# Patient Record
Sex: Female | Born: 1960 | Race: Black or African American | Hispanic: No | Marital: Married | Smoking: Never smoker
Health system: Southern US, Community
[De-identification: ages and names within clinical notes are randomized; demographics above are authoritative.]

## PROBLEM LIST (undated history)

## (undated) DIAGNOSIS — E785 Hyperlipidemia, unspecified: Secondary | ICD-10-CM

## (undated) DIAGNOSIS — I10 Essential (primary) hypertension: Secondary | ICD-10-CM

## (undated) DIAGNOSIS — M25512 Pain in left shoulder: Secondary | ICD-10-CM

## (undated) DIAGNOSIS — M549 Dorsalgia, unspecified: Secondary | ICD-10-CM

## (undated) DIAGNOSIS — T7840XA Allergy, unspecified, initial encounter: Secondary | ICD-10-CM

## (undated) DIAGNOSIS — E669 Obesity, unspecified: Secondary | ICD-10-CM

## (undated) DIAGNOSIS — E119 Type 2 diabetes mellitus without complications: Secondary | ICD-10-CM

## (undated) DIAGNOSIS — F418 Other specified anxiety disorders: Principal | ICD-10-CM

## (undated) DIAGNOSIS — M25511 Pain in right shoulder: Secondary | ICD-10-CM

## (undated) DIAGNOSIS — M25551 Pain in right hip: Secondary | ICD-10-CM

## (undated) HISTORY — DX: Pain in right hip: M25.551

## (undated) HISTORY — DX: Obesity, unspecified: E66.9

## (undated) HISTORY — DX: Hyperlipidemia, unspecified: E78.5

## (undated) HISTORY — DX: Type 2 diabetes mellitus without complications: E11.9

## (undated) HISTORY — DX: Other specified anxiety disorders: F41.8

## (undated) HISTORY — DX: Pain in left shoulder: M25.512

## (undated) HISTORY — DX: Dorsalgia, unspecified: M54.9

## (undated) HISTORY — DX: Allergy, unspecified, initial encounter: T78.40XA

## (undated) HISTORY — DX: Pain in right shoulder: M25.511

## (undated) HISTORY — DX: Essential (primary) hypertension: I10

---

## 2001-07-15 ENCOUNTER — Ambulatory Visit (HOSPITAL_COMMUNITY): Admission: RE | Admit: 2001-07-15 | Discharge: 2001-07-15 | Payer: Self-pay | Admitting: Family Medicine

## 2001-07-15 ENCOUNTER — Encounter: Payer: Self-pay | Admitting: Family Medicine

## 2001-09-02 ENCOUNTER — Encounter: Payer: Self-pay | Admitting: Family Medicine

## 2001-09-02 ENCOUNTER — Ambulatory Visit (HOSPITAL_COMMUNITY): Admission: RE | Admit: 2001-09-02 | Discharge: 2001-09-02 | Payer: Self-pay | Admitting: Family Medicine

## 2001-11-17 ENCOUNTER — Other Ambulatory Visit: Admission: RE | Admit: 2001-11-17 | Discharge: 2001-11-17 | Payer: Self-pay | Admitting: Family Medicine

## 2002-09-12 ENCOUNTER — Ambulatory Visit (HOSPITAL_COMMUNITY): Admission: RE | Admit: 2002-09-12 | Discharge: 2002-09-12 | Payer: Self-pay | Admitting: Family Medicine

## 2002-09-12 ENCOUNTER — Encounter: Payer: Self-pay | Admitting: Family Medicine

## 2003-09-20 ENCOUNTER — Ambulatory Visit (HOSPITAL_COMMUNITY): Admission: RE | Admit: 2003-09-20 | Discharge: 2003-09-20 | Payer: Self-pay | Admitting: Family Medicine

## 2003-09-20 ENCOUNTER — Encounter: Payer: Self-pay | Admitting: Family Medicine

## 2003-11-24 ENCOUNTER — Ambulatory Visit (HOSPITAL_COMMUNITY): Admission: RE | Admit: 2003-11-24 | Discharge: 2003-11-24 | Payer: Self-pay | Admitting: Family Medicine

## 2004-09-27 ENCOUNTER — Ambulatory Visit (HOSPITAL_COMMUNITY): Admission: RE | Admit: 2004-09-27 | Discharge: 2004-09-27 | Payer: Self-pay | Admitting: Family Medicine

## 2005-01-09 ENCOUNTER — Ambulatory Visit: Payer: Self-pay | Admitting: Family Medicine

## 2005-02-19 ENCOUNTER — Emergency Department: Payer: Self-pay | Admitting: Emergency Medicine

## 2005-02-19 ENCOUNTER — Ambulatory Visit: Payer: Self-pay | Admitting: Family Medicine

## 2005-06-25 ENCOUNTER — Ambulatory Visit: Payer: Self-pay | Admitting: Family Medicine

## 2005-10-31 ENCOUNTER — Ambulatory Visit (HOSPITAL_COMMUNITY): Admission: RE | Admit: 2005-10-31 | Discharge: 2005-10-31 | Payer: Self-pay | Admitting: Family Medicine

## 2006-01-09 ENCOUNTER — Ambulatory Visit: Payer: Self-pay | Admitting: Family Medicine

## 2006-07-30 ENCOUNTER — Ambulatory Visit: Payer: Self-pay | Admitting: Family Medicine

## 2006-11-06 ENCOUNTER — Ambulatory Visit (HOSPITAL_COMMUNITY): Admission: RE | Admit: 2006-11-06 | Discharge: 2006-11-06 | Payer: Self-pay | Admitting: Family Medicine

## 2007-01-19 ENCOUNTER — Other Ambulatory Visit: Admission: RE | Admit: 2007-01-19 | Discharge: 2007-01-19 | Payer: Self-pay | Admitting: Family Medicine

## 2007-01-19 ENCOUNTER — Encounter: Payer: Self-pay | Admitting: Family Medicine

## 2007-01-19 ENCOUNTER — Ambulatory Visit: Payer: Self-pay | Admitting: Family Medicine

## 2007-01-26 ENCOUNTER — Ambulatory Visit: Payer: Self-pay | Admitting: Family Medicine

## 2007-01-26 LAB — CONVERTED CEMR LAB
Ketones, ur: NEGATIVE mg/dL
Nitrite: NEGATIVE
Urobilinogen, UA: 1 (ref 0.0–1.0)

## 2007-01-27 ENCOUNTER — Encounter: Payer: Self-pay | Admitting: Family Medicine

## 2007-05-11 ENCOUNTER — Ambulatory Visit: Payer: Self-pay | Admitting: Family Medicine

## 2007-05-12 ENCOUNTER — Encounter: Payer: Self-pay | Admitting: Family Medicine

## 2007-07-16 ENCOUNTER — Ambulatory Visit: Payer: Self-pay | Admitting: Family Medicine

## 2007-11-12 ENCOUNTER — Ambulatory Visit (HOSPITAL_COMMUNITY): Admission: RE | Admit: 2007-11-12 | Discharge: 2007-11-12 | Payer: Self-pay | Admitting: Family Medicine

## 2007-12-16 ENCOUNTER — Encounter: Payer: Self-pay | Admitting: Family Medicine

## 2008-02-23 ENCOUNTER — Encounter: Payer: Self-pay | Admitting: Family Medicine

## 2008-02-23 ENCOUNTER — Other Ambulatory Visit: Admission: RE | Admit: 2008-02-23 | Discharge: 2008-02-23 | Payer: Self-pay | Admitting: Family Medicine

## 2008-02-23 ENCOUNTER — Ambulatory Visit: Payer: Self-pay | Admitting: Family Medicine

## 2008-02-23 LAB — CONVERTED CEMR LAB: Pap Smear: NORMAL

## 2008-03-02 ENCOUNTER — Encounter: Payer: Self-pay | Admitting: Family Medicine

## 2008-03-02 DIAGNOSIS — E669 Obesity, unspecified: Secondary | ICD-10-CM | POA: Insufficient documentation

## 2008-03-02 DIAGNOSIS — I10 Essential (primary) hypertension: Secondary | ICD-10-CM | POA: Insufficient documentation

## 2008-03-17 ENCOUNTER — Encounter: Payer: Self-pay | Admitting: Family Medicine

## 2008-03-17 LAB — CONVERTED CEMR LAB
AST: 16 units/L (ref 0–37)
Albumin: 4 g/dL (ref 3.5–5.2)
CO2: 23 meq/L (ref 19–32)
Chloride: 101 meq/L (ref 96–112)
HDL: 59 mg/dL (ref >39)
LDL Cholesterol: 117 mg/dL — ABNORMAL HIGH (ref 0–99)
Potassium: 3.6 meq/L (ref 3.5–5.3)
Sodium: 139 meq/L (ref 135–145)
Total Bilirubin: 0.5 mg/dL (ref 0.3–1.2)
Total CHOL/HDL Ratio: 3.2

## 2008-08-03 ENCOUNTER — Encounter: Payer: Self-pay | Admitting: Family Medicine

## 2008-08-04 LAB — CONVERTED CEMR LAB
ALT: 12 units/L (ref 0–35)
AST: 18 units/L (ref 0–37)
Albumin: 4 g/dL (ref 3.5–5.2)
CO2: 26 meq/L (ref 19–32)
Calcium: 9.2 mg/dL (ref 8.4–10.5)
Chloride: 102 meq/L (ref 96–112)
HDL: 60 mg/dL (ref >39)
Sodium: 139 meq/L (ref 135–145)
Total Bilirubin: 0.5 mg/dL (ref 0.3–1.2)
Total CHOL/HDL Ratio: 3.4

## 2008-08-11 ENCOUNTER — Ambulatory Visit: Payer: Self-pay | Admitting: Family Medicine

## 2008-11-24 ENCOUNTER — Ambulatory Visit (HOSPITAL_COMMUNITY): Admission: RE | Admit: 2008-11-24 | Discharge: 2008-11-24 | Payer: Self-pay | Admitting: Family Medicine

## 2009-03-09 ENCOUNTER — Encounter: Payer: Self-pay | Admitting: Family Medicine

## 2009-03-09 ENCOUNTER — Encounter (INDEPENDENT_AMBULATORY_CARE_PROVIDER_SITE_OTHER): Payer: Self-pay | Admitting: *Deleted

## 2009-03-09 LAB — CONVERTED CEMR LAB
BUN: 11 mg/dL
CO2: 23 meq/L
CO2: 23 meq/L (ref 19–32)
Calcium: 9 mg/dL
Chloride: 102 meq/L (ref 96–112)
Cholesterol: 153 mg/dL
Creatinine, Ser: 0.84 mg/dL
HCT: 37.6 % (ref 36.0–46.0)
HDL: 58 mg/dL
HDL: 58 mg/dL (ref >39)
LDL Cholesterol: 84 mg/dL
LDL Cholesterol: 84 mg/dL (ref 0–99)
Platelets: 315 10*3/uL (ref 150–400)
Potassium: 4 meq/L (ref 3.5–5.3)
Sodium: 137 meq/L (ref 135–145)
TSH: 1.053 microintl units/mL
TSH: 1.053 microintl units/mL (ref 0.350–4.500)
Total CHOL/HDL Ratio: 2.6
VLDL: 11 mg/dL (ref 0–40)
WBC: 5.4 10*3/uL (ref 4.0–10.5)

## 2009-03-14 ENCOUNTER — Ambulatory Visit: Payer: Self-pay | Admitting: Family Medicine

## 2009-03-14 ENCOUNTER — Encounter: Payer: Self-pay | Admitting: Family Medicine

## 2009-05-09 ENCOUNTER — Encounter: Payer: Self-pay | Admitting: Family Medicine

## 2009-09-14 ENCOUNTER — Telehealth: Payer: Self-pay | Admitting: Family Medicine

## 2009-09-19 ENCOUNTER — Ambulatory Visit: Payer: Self-pay | Admitting: Family Medicine

## 2009-09-19 DIAGNOSIS — J309 Allergic rhinitis, unspecified: Secondary | ICD-10-CM | POA: Insufficient documentation

## 2009-09-19 DIAGNOSIS — M549 Dorsalgia, unspecified: Secondary | ICD-10-CM | POA: Insufficient documentation

## 2009-09-19 DIAGNOSIS — M545 Low back pain, unspecified: Secondary | ICD-10-CM | POA: Insufficient documentation

## 2009-09-19 LAB — CONVERTED CEMR LAB
Nitrite: NEGATIVE
Specific Gravity, Urine: 1.01
WBC Urine, dipstick: NEGATIVE

## 2009-10-03 ENCOUNTER — Encounter: Payer: Self-pay | Admitting: Family Medicine

## 2009-10-04 ENCOUNTER — Ambulatory Visit: Payer: Self-pay | Admitting: Family Medicine

## 2009-10-04 ENCOUNTER — Encounter (INDEPENDENT_AMBULATORY_CARE_PROVIDER_SITE_OTHER): Payer: Self-pay | Admitting: *Deleted

## 2009-10-04 DIAGNOSIS — R109 Unspecified abdominal pain: Secondary | ICD-10-CM | POA: Insufficient documentation

## 2009-10-04 LAB — CONVERTED CEMR LAB
BUN: 10 mg/dL
Calcium: 9.4 mg/dL
Potassium: 3.9 meq/L
Sodium: 138 meq/L

## 2009-10-08 LAB — CONVERTED CEMR LAB
BUN: 10 mg/dL (ref 6–23)
Creatinine, Ser: 0.75 mg/dL (ref 0.40–1.20)
Glucose, Bld: 79 mg/dL (ref 70–99)

## 2009-10-18 ENCOUNTER — Telehealth: Payer: Self-pay | Admitting: Cardiology

## 2009-10-19 ENCOUNTER — Ambulatory Visit (HOSPITAL_COMMUNITY): Admission: RE | Admit: 2009-10-19 | Discharge: 2009-10-19 | Payer: Self-pay | Admitting: Family Medicine

## 2009-11-14 ENCOUNTER — Encounter: Payer: Self-pay | Admitting: Cardiology

## 2009-11-20 ENCOUNTER — Ambulatory Visit: Payer: Self-pay | Admitting: Family Medicine

## 2009-11-21 ENCOUNTER — Encounter (INDEPENDENT_AMBULATORY_CARE_PROVIDER_SITE_OTHER): Payer: Self-pay | Admitting: *Deleted

## 2009-11-23 ENCOUNTER — Ambulatory Visit: Payer: Self-pay | Admitting: Cardiology

## 2009-11-23 ENCOUNTER — Encounter (INDEPENDENT_AMBULATORY_CARE_PROVIDER_SITE_OTHER): Payer: Self-pay | Admitting: *Deleted

## 2009-12-06 ENCOUNTER — Ambulatory Visit: Payer: Self-pay | Admitting: Cardiology

## 2009-12-06 ENCOUNTER — Ambulatory Visit (HOSPITAL_COMMUNITY): Admission: RE | Admit: 2009-12-06 | Discharge: 2009-12-06 | Payer: Self-pay | Admitting: Cardiology

## 2010-01-14 ENCOUNTER — Telehealth: Payer: Self-pay | Admitting: Family Medicine

## 2010-01-18 ENCOUNTER — Ambulatory Visit: Payer: Self-pay | Admitting: Family Medicine

## 2010-01-18 DIAGNOSIS — M25519 Pain in unspecified shoulder: Secondary | ICD-10-CM | POA: Insufficient documentation

## 2010-03-21 ENCOUNTER — Other Ambulatory Visit: Admission: RE | Admit: 2010-03-21 | Discharge: 2010-03-21 | Payer: Self-pay | Admitting: Family Medicine

## 2010-03-21 ENCOUNTER — Ambulatory Visit: Payer: Self-pay | Admitting: Family Medicine

## 2010-03-21 DIAGNOSIS — R5383 Other fatigue: Secondary | ICD-10-CM

## 2010-03-21 DIAGNOSIS — M542 Cervicalgia: Secondary | ICD-10-CM | POA: Insufficient documentation

## 2010-03-21 DIAGNOSIS — R5381 Other malaise: Secondary | ICD-10-CM | POA: Insufficient documentation

## 2010-03-28 ENCOUNTER — Telehealth: Payer: Self-pay | Admitting: Family Medicine

## 2010-05-31 ENCOUNTER — Ambulatory Visit (HOSPITAL_COMMUNITY): Admission: RE | Admit: 2010-05-31 | Discharge: 2010-05-31 | Payer: Self-pay | Admitting: Family Medicine

## 2010-06-12 ENCOUNTER — Ambulatory Visit: Payer: Self-pay | Admitting: Family Medicine

## 2010-06-12 DIAGNOSIS — M25559 Pain in unspecified hip: Secondary | ICD-10-CM | POA: Insufficient documentation

## 2010-06-13 LAB — CONVERTED CEMR LAB
Basophils Absolute: 0 10*3/uL (ref 0.0–0.1)
Basophils Relative: 0 % (ref 0–1)
Cholesterol: 163 mg/dL (ref 0–200)
Eosinophils Absolute: 0 10*3/uL (ref 0.0–0.7)
Eosinophils Relative: 1 % (ref 0–5)
HCT: 38.5 % (ref 36.0–46.0)
HDL: 63 mg/dL (ref >39)
Hemoglobin: 12.3 g/dL (ref 12.0–15.0)
LDL Cholesterol: 87 mg/dL (ref 0–99)
Lymphocytes Relative: 33 % (ref 12–46)
Lymphs Abs: 2.6 10*3/uL (ref 0.7–4.0)
MCHC: 31.9 g/dL (ref 30.0–36.0)
MCV: 78.7 fL (ref 78.0–100.0)
Monocytes Absolute: 0.5 10*3/uL (ref 0.1–1.0)
Monocytes Relative: 6 % (ref 3–12)
Neutro Abs: 4.6 10*3/uL (ref 1.7–7.7)
Neutrophils Relative %: 60 % (ref 43–77)
Platelets: 393 10*3/uL (ref 150–400)
RBC: 4.89 M/uL (ref 3.87–5.11)
RDW: 14.7 % (ref 11.5–15.5)
Total CHOL/HDL Ratio: 2.6
Triglycerides: 67 mg/dL (ref <150)
VLDL: 13 mg/dL (ref 0–40)
Vit D, 25-Hydroxy: 46 ng/mL (ref 30–89)
WBC: 7.7 10*3/uL (ref 4.0–10.5)

## 2010-11-12 ENCOUNTER — Ambulatory Visit: Payer: Self-pay | Admitting: Family Medicine

## 2011-01-14 NOTE — Progress Notes (Signed)
Summary: phone #  Phone Note Call from Patient   Summary of Call: left message the # u want is  [phone]  fax #  [phone]  [facility]  sarah [facility]cd[facility] Initial call taken by: Lind Guest,  March 28, 2010 8:46 AM  Follow-up for Phone Call        sent referral for physical therpy over to the fax number that pt left.  Follow-up by: Rudene Anda,  March 28, 2010 10:25 AM

## 2011-01-14 NOTE — Assessment & Plan Note (Signed)
Summary: office visit   Vital Signs:  Patient profile:   50 year old female Menstrual status:  regular LMP:     11/06/2010 Height:      61.5 inches Weight:      161 pounds BMI:     30.04 O2 Sat:      99 % on Room air Pulse rate:   94 / minute Pulse rhythm:   regular Resp:     16 per minute BP sitting:   124 / 72  (left arm)  Vitals Entered By: Adella Hare LPN (November 12, 2010 11:10 AM)  Nutrition Counseling: Patient's BMI is greater than 25 and therefore counseled on weight management options.  O2 Flow:  Room air CC: follow-up visit Is Patient Diabetic? No Pain Assessment Patient in pain? no      LMP (date): 11/06/2010     Menstrual Status regular Enter LMP: 11/06/2010 Last PAP Result NEGATIVE FOR INTRAEPITHELIAL LESIONS OR MALIGNANCY.   Primary Care Provider:  Dr. Syliva Overman  CC:  follow-up visit.  History of Present Illness: Reports  thatshe has been  doing well. her physical activity ha incrased, and she has reduced her intake also.She has continued to have weight loss success. Denies recent fever or chills. Denies sinus pressure, nasal congestion , ear pain or sore throat. Denies chest congestion, or cough productive of sputum. Denies chest pain, palpitations, PND, orthopnea or leg swelling. Denies abdominal pain, nausea, vomitting, diarrhea or constipation. Denies change in bowel movements or bloody stool. Denies dysuria , frequency, incontinence or hesitancy.  Denies headaches, vertigo, seizures. Denies depression, anxiety or insomnia. Denies  rash, lesions, or itch.     Current Medications (verified): 1)  Necon 7/7/7 0.5/0.75/1-35 Mg-Mcg  Tabs (Norethin-Eth Estrad Triphasic) .... Take 1 Tablet By Mouth Once A Day 2)  Potassium 99 Mg Tabs (Potassium) .... Take 1 Tab Daily 3)  Amlodipine Besylate 2.5 Mg Tabs (Amlodipine Besylate) .... Take 1 Tablet By Mouth Once A Day 4)  Ibuprofen 800 Mg Tabs (Ibuprofen) .... Take 1 Tablet By Mouth Once A  Day As Needed 5)  Maxzide 75-50 Mg Tabs (Triamterene-Hctz) .... Take 1 Tablet By Mouth Once A Day 6)  Amlodipine Besylate 2.5 Mg Tabs (Amlodipine Besylate) .... Take 1 Tablet By Mouth Once A Day  Allergies (verified): 1)  ! Septra 2)  ! Codeine  Review of Systems      See HPI Eyes:  Denies blurring, discharge, eye pain, and red eye. MS:  Complains of joint pain and stiffness; 5 day h/o right wrist pain limiting moveement worse in the early morning extending up to the shoulder, she continues to experience neck and  shhoulder pain. Heme:  Denies abnormal bruising and bleeding. Allergy:  Denies hives or rash and itching eyes.  Physical Exam  General:  Well-developed,well-nourished,in no acute distress; alert,appropriate and cooperative throughout examination HEENT: No facial asymmetry,  EOMI, No sinus tenderness, TM's Clear, oropharynx  pink and moist.decreased ROM right neck with spasm  Chest: Clear to auscultation bilaterally.  CVS: S1, S2, No murmurs, No S3.   Abd: Soft, Nontender.   mS: decreased ROM neck, shoulder right and right wrist,adequate in hips and knees. Ext: No edema.   CNS: CN 2-12 intact, power tone and sensation normal throughout.   Skin: Intact, no visible lesions or rashes.  Psych: Good eye contact, normal affect.  Memory intact, not anxious or depressed appearing.    Impression & Recommendations:  Problem # 1:  NECK PAIN, RIGHT (ICD-723.1) Assessment  Deteriorated toradoland depomedrol administered  Problem # 2:  OBESITY (ICD-278.00) Assessment: Improved  Ht: 61.5 (11/12/2010)   Wt: 161 (11/12/2010)   BMI: 30.04 (11/12/2010)  Problem # 3:  HYPERTENSION (ICD-401.9) Assessment: Improved  BP today: 124/72 Prior BP: 130/80 (06/12/2010)  Labs Reviewed: K+: 3.9 (10/04/2009) Creat: : 0.75 (10/04/2009)   Chol: 163 (06/12/2010)   HDL: 63 (06/12/2010)   LDL: 87 (06/12/2010)   TG: 67 (06/12/2010)  Complete Medication List: 1)  Necon 7/7/7 0.5/0.75/1-35  Mg-mcg Tabs (Norethin-eth estrad triphasic) .... Take 1 tablet by mouth once a day 2)  Potassium 99 Mg Tabs (Potassium) .... Take 1 tab daily 3)  Amlodipine Besylate 2.5 Mg Tabs (Amlodipine besylate) .... Take 1 tablet by mouth once a day 4)  Ibuprofen 800 Mg Tabs (Ibuprofen) .... Take 1 tablet by mouth once a day as needed 5)  Maxzide 75-50 Mg Tabs (Triamterene-hctz) .... Take 1 tablet by mouth once a day 6)  Amlodipine Besylate 2.5 Mg Tabs (Amlodipine besylate) .... Take 1 tablet by mouth once a day 7)  Ibuprofen 800 Mg Tabs (Ibuprofen) .... Take 1 tablet by mouth three times a day for 7 days, then as needed for unco ntrolled pain 8)  Cyclobenzaprine Hcl 10 Mg Tabs (Cyclobenzaprine hcl) .... Take 1 tab by mouth at bedtime as needed for spasm  Other Orders: Depo- Medrol 80mg  (J1040) Ketorolac-Toradol 15mg  (O1308) Admin of Therapeutic Inj  intramuscular or subcutaneous (65784)  Patient Instructions: 1)  CPE mid April or after. 2)  It is important that you exercise regularly at least 20 minutes 5 times a week. If you develop chest pain, have severe difficulty breathing, or feel very tired , stop exercising immediately and seek medical attention. 3)  You need to lose weight. Consider a lower calorie diet and regular exercise. Congrats on weight loss. 4)  Injections today for neck, shoulder and right wrist pain, meds are also sent in  Prescriptions: CYCLOBENZAPRINE HCL 10 MG TABS (CYCLOBENZAPRINE HCL) Take 1 tab by mouth at bedtime as needed for spasm  #30 x 0   Entered and Authorized by:   Syliva Overman MD   Signed by:   Syliva Overman MD on 11/12/2010   Method used:   Electronically to        [facility] [phone]* (retail)       [address] [place], [place]  [address]       Ph: [phone] or [phone]       Fax: [phone]   RxID:   [id number] IBUPROFEN 800 MG TABS (IBUPROFEN) Take 1 tablet by mouth three times a day for 7 days, then as needed  for unco ntrolled pain  #50 x 0   Entered and Authorized by:   Syliva Overman MD   Signed by:   Syliva Overman MD on 11/12/2010   Method used:   Electronically to        [facility] [phone]* (retail)       [address] [place], [place]  [address]       Ph: [phone] or [phone]       Fax: [phone]   RxID:   [id number] PREDNISONE (PAK) 5 MG TABS (PREDNISONE) Use as directed  #21 x 0   Entered and Authorized by:   Syliva Overman MD  Signed by:   Syliva Overman MD on 11/12/2010   Method used:   Electronically to        [facility] [phone]* (retail)       [address] [place], [place]  [address]       Ph: [phone] or [phone]       Fax: [phone]   RxID:   [id number]    Medication Administration  Injection # 1:    Medication: Depo- Medrol 80mg     Diagnosis: NECK PAIN, RIGHT (ICD-723.1)    Route: IM    Site: RUOQ gluteus    Exp Date: 06/12    Lot #: [id number]    Mfr: Pharmacia    Patient tolerated injection without complications    Given by: Adella Hare LPN (November 12, 2010 11:59 AM)  Injection # 2:    Medication: Ketorolac-Toradol 15mg     Diagnosis: NECK PAIN, RIGHT (ICD-723.1)    Route: IM    Site: LUOQ gluteus    Exp Date: 10/16/2011    Lot #: [id number]    Mfr: [email or web address]    Comments: toradol 60mg  given    Patient tolerated injection without complications    Given by: Adella Hare LPN (November 12, 2010 12:00 PM)  Orders Added: 1)  Est. Patient Level IV [47425] 2)  Depo- Medrol 80mg  [J1040] 3)  Ketorolac-Toradol 15mg  [J1885] 4)  Admin of Therapeutic Inj  intramuscular or subcutaneous [96372]     Medication Administration  Injection # 1:    Medication: Depo- Medrol 80mg     Diagnosis: NECK PAIN, RIGHT (ICD-723.1)    Route: IM    Site: RUOQ gluteus    Exp Date: 06/12    Lot #: [id number]    Mfr: Pharmacia    Patient tolerated injection without complications    Given by:  Adella Hare LPN (November 12, 2010 11:59 AM)  Injection # 2:    Medication: Ketorolac-Toradol 15mg     Diagnosis: NECK PAIN, RIGHT (ICD-723.1)    Route: IM    Site: LUOQ gluteus    Exp Date: 10/16/2011    Lot #: [id number]    Mfr: [email or web address]    Comments: toradol 60mg  given    Patient tolerated injection without complications    Given by: Adella Hare LPN (November 12, 2010 12:00 PM)  Orders Added: 1)  Est. Patient Level IV [64332] 2)  Depo- Medrol 80mg  [J1040] 3)  Ketorolac-Toradol 15mg  [J1885] 4)  Admin of Therapeutic Inj  intramuscular or subcutaneous [95188]

## 2011-01-14 NOTE — Progress Notes (Signed)
Summary: speak with doc  Phone Note Call from Patient   Summary of Call: pt was wanting a office visit for Friday. Told her we didn't have anything. but she could call back just in case someone cancelled. Also she could call back on thurs and we could possibly get her in with one of the work in appts. pt says she having some problems and would like to speak with doc. maybe to see if she can get a referral. [phone] Initial call taken by: Rudene Anda,  January 14, 2010 5:02 PM  Follow-up for Phone Call        Please contact pt for add'l information regarding concerns or questions. Follow-up by: Esperanza Sheets PA,  January 15, 2010 1:09 PM  Additional Follow-up for Phone Call Additional follow up Details #1::        returned call, left message Additional Follow-up by: Worthy Keeler LPN,  January 15, 2010 1:35 PM    Additional Follow-up for Phone Call Additional follow up Details #2::    pls refer pt for eval and treatment of bilateral shoulder pain she has had injection in twe left shouder, prednisone and has been to urgent care 3 tmes no better, has also had vicodin  ortho in [place] asap  for further treatment,lv a msg on her cell   [phone], she will check it during break and also on her home phone pls Follow-up by: Syliva Overman MD,  January 15, 2010 5:07 PM  Additional Follow-up for Phone Call Additional follow up Details #3:: Details for Additional Follow-up Action Taken: faxed over information to refer pt to dr. Romeo Apple. they will call her with appt. pt was called and left a message.  Additional Follow-up by: Rudene Anda,  January 16, 2010 9:35 AM
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
## 2011-01-14 NOTE — Letter (Signed)
Summary: phone calls  phone calls   Imported By: Lind Guest 05/22/2010 11:19:10  _____________________________________________________________________  External Attachment:    Type:   Image     Comment:   External Document

## 2011-01-14 NOTE — Assessment & Plan Note (Signed)
Summary: ov   Vital Signs:  Patient profile:   50 year old female Menstrual status:  perimenopausal Height:      61.5 inches Weight:      164.75 pounds BMI:     30.74 O2 Sat:      98 % Pulse rate:   78 / minute Pulse rhythm:   regular Resp:     16 per minute BP sitting:   140 / 82  Vitals Entered By: Everitt Amber (January 18, 2010 8:37 AM)  Nutrition Counseling: Patient's BMI is greater than 25 and therefore counseled on weight management options. CC: having pain in both arms, hurts to raise them above her head. Been going on since dec   Primary Care Provider:  Dr. Syliva Overman  CC:  having pain in both arms and hurts to raise them above her head. Been going on since dec.  History of Present Illness: Pt repoprts that since  12/24 she has had disabling neck pain radiating down both upper extremeties, which bothdisturbs her sleeep, and prevents her from carrying out daily functions.She has been to an urgent care twice about this matter, she has been on steroids and oxycodone no relief.  Her job involves upper body movement and she was taken out of work from Jan 3 to 6, she had been on vacation 12/24 to year end.  First eval was 12/29 and next Jan2, 2011.  Pain is rated at a 10 is constant Earlier in the week i had recommended thru a phone call that an ortho eval would be beneficial , and that we would schedule one, pt states since she heard nothing else from the office she came in today.   Current Medications (verified): 1)  Necon 7/7/7 0.5/0.75/1-35 Mg-Mcg  Tabs (Norethin-Eth Estrad Triphasic) .... Take 1 Tablet By Mouth Once A Day 2)  Maxzide 75-50 Mg Tabs (Triamterene-Hctz) .... One Tab By Mouth Qd 3)  Potassium 99 Mg Tabs (Potassium) .... Take 1 Tab Daily  Allergies (verified): 1)  ! Septra 2)  ! Codeine  Review of Systems      See HPI General:  Complains of fatigue and sleep disorder; denies chills and fever. CV:  Denies chest pain or discomfort, palpitations,  and swelling of hands. Resp:  Denies cough and sputum productive. GI:  Denies abdominal pain, constipation, dark tarry stools, nausea, and vomiting. GU:  Denies dysuria and urinary frequency. MS:  See HPI. Neuro:  Denies headaches and seizures. Psych:  Complains of anxiety; denies depression.  Physical Exam  General:  Well-developed,well-nourished,in no acute distress; alert,appropriate and cooperative throughout examination. Pt appears tearful and anxious, extremely worried about her health, statesd no improvement in a 6 week period, and persisten shoulder and upper ext pain which is disabling. HEENT: No facial asymmetry,  EOMI, No sinus tenderness, TM's Clear, oropharynx  pink and moist.   Chest: Clear to auscultation bilaterally.  CVS: S1, S2, No murmurs, No S3.   Abd: Soft, Nontender.  MS: Adequate ROM spine, hips,  and knees. Decreased upper ext  mobilty, bilaterally Ext: No edema.   CNS: CN 2-12 intact, power tone and sensation normal throughout.   Skin: Intact, no visible lesions or rashes.  Psych: Good eye contact,  Memory intact, both anxious and  depressed appearing.    Impression & Recommendations:  Problem # 1:  SHOULDER PAIN, BILATERAL (ICD-719.41) Assessment Deteriorated  Her updated medication list for this problem includes:    Ibuprofen 800 Mg Tabs (Ibuprofen) .Marland Kitchen... Take 1  tablet by mouth three times a day    Oxycodone-acetaminophen 5-500 Mg Caps (Oxycodone-acetaminophen) .Marland Kitchen... Take 1 capsule by mouth two times a day as needed  Orders: Orthopedic Referral (Ortho)  Problem # 2:  HYPERTENSION (ICD-401.9) Assessment: Deteriorated  Her updated medication list for this problem includes:    Maxzide 75-50 Mg Tabs (Triamterene-hctz) ..... One tab by mouth qd  BP today: 140/82 Prior BP: 128/79 (11/23/2009)  Labs Reviewed: K+: 3.9 (10/04/2009) Creat: : 0.75 (10/04/2009)   Chol: 153 (03/09/2009)   HDL: 58 (03/09/2009)   LDL: 84 (03/09/2009)   TG: 55  (03/09/2009)  Problem # 3:  DYSLIPIDEMIA (ICD-272.4)  Labs Reviewed: SGOT: 18 (08/03/2008)   SGPT: 12 (08/03/2008)   HDL:58 (03/09/2009), 58 (03/09/2009)  LDL:84 (03/09/2009), 84 (03/09/2009)  Chol:153 (03/09/2009), 153 (03/09/2009)  Trig:55 (03/09/2009), 55 (03/09/2009)  Complete Medication List: 1)  Necon 7/7/7 0.5/0.75/1-35 Mg-mcg Tabs (Norethin-eth estrad triphasic) .... Take 1 tablet by mouth once a day 2)  Maxzide 75-50 Mg Tabs (Triamterene-hctz) .... One tab by mouth qd 3)  Potassium 99 Mg Tabs (Potassium) .... Take 1 tab daily 4)  Ibuprofen 800 Mg Tabs (Ibuprofen) .... Take 1 tablet by mouth three times a day 5)  Oxycodone-acetaminophen 5-500 Mg Caps (Oxycodone-acetaminophen) .... Take 1 capsule by mouth two times a day as needed  Patient Instructions: 1)  Please schedule a follow-up appointment in 6 weeks. 2)  You will be referred to orthopedics for further evaluation of shoulder pain and management. 3)  You need to be out of work until this matter is resolved Prescriptions: PREDNISONE (PAK) 10 MG TABS (PREDNISONE) Use as directed  #21 x 0   Entered by:   Everitt Amber   Authorized by:   Syliva Overman MD   Signed by:   Everitt Amber on 01/18/2010   Method used:   Electronically to        [facility] [phone]* (retail)       [address]       [place], [place]  [address]       Ph: [phone] or [phone]       Fax: [phone]   RxID:   [id number] IBUPROFEN 800 MG TABS (IBUPROFEN) Take 1 tablet by mouth three times a day  #30 x 0   Entered by:   Everitt Amber   Authorized by:   Syliva Overman MD   Signed by:   Everitt Amber on 01/18/2010   Method used:   Electronically to        [facility] [phone]* (retail)       [address]       [place], [place]  [address]       Ph: [phone] or [phone]       Fax: [phone]   RxID:   [id number] OXYCODONE-ACETAMINOPHEN 5-500 MG CAPS (OXYCODONE-ACETAMINOPHEN) Take 1 capsule  by mouth two times a day as needed  #40 x 0   Entered by:   Everitt Amber   Authorized by:   Syliva Overman MD   Signed by:   Everitt Amber on 01/18/2010   Method used:   Printed then faxed to ...       [facility] [phone]* (retail)       [address]       [place], [place]  [address]  Ph: [phone] or [phone]       Fax: [phone]   RxID:   [id number] OXYCODONE-ACETAMINOPHEN 5-500 MG CAPS (OXYCODONE-ACETAMINOPHEN) Take 1 capsule by mouth two times a day as needed  #40 x 0   Entered and Authorized by:   Syliva Overman MD   Signed by:   Syliva Overman MD on 01/18/2010   Method used:   Printed then faxed to ...       [facility] [phone]* (retail)       [address] [place], [place]  [address]       Ph: [phone] or [phone]       Fax: [phone]   RxID:   [id number] IBUPROFEN 800 MG TABS (IBUPROFEN) Take 1 tablet by mouth three times a day  #30 x 0   Entered and Authorized by:   Syliva Overman MD   Signed by:   Syliva Overman MD on 01/18/2010   Method used:   Electronically to        [facility] [phone]* (retail)       [address] [place], [place]  [address]       Ph: [phone] or [phone]       Fax: [phone]   RxID:   [id number] PREDNISONE (PAK) 10 MG TABS (PREDNISONE) Use as directed  #21 x 0   Entered and Authorized by:   Syliva Overman MD   Signed by:   Syliva Overman MD on 01/18/2010   Method used:   Electronically to        [facility] [phone]* (retail)       [address] [place], [place]  [address]       Ph: [phone] or [phone]       Fax: [phone]   RxID:   [id number]  pharmacy aware to discard oxycodone script, not to fill and have patient call office if questions

## 2011-01-14 NOTE — Letter (Signed)
Summary: DEMO  DEMO   Imported By: Lind Guest 05/22/2010 11:07:09  _____________________________________________________________________  External Attachment:    Type:   Image     Comment:   External Document
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
## 2011-01-14 NOTE — Letter (Signed)
Summary: MISC  MISC   Imported By: Lind Guest 05/22/2010 11:08:18  _____________________________________________________________________  External Attachment:    Type:   Image     Comment:   External Document

## 2011-01-14 NOTE — Letter (Signed)
Summary: office notes  office notes   Imported By: Lind Guest 05/22/2010 11:24:48  _____________________________________________________________________  External Attachment:    Type:   Image     Comment:   External Document

## 2011-01-14 NOTE — Letter (Signed)
Summary: history and physical  history and physical   Imported By: Lind Guest 05/22/2010 11:19:41  _____________________________________________________________________  External Attachment:    Type:   Image     Comment:   External Document

## 2011-01-14 NOTE — Letter (Signed)
Summary: LABS  LABS   Imported By: Lind Guest 05/22/2010 11:07:43  _____________________________________________________________________  External Attachment:    Type:   Image     Comment:   External Document

## 2011-02-14 ENCOUNTER — Telehealth (INDEPENDENT_AMBULATORY_CARE_PROVIDER_SITE_OTHER): Payer: Self-pay | Admitting: *Deleted

## 2011-02-20 NOTE — Progress Notes (Signed)
Summary: medicine  Phone Note Call from Patient   Summary of Call: needs her amlodipine refilled at [facility] she only has 1 pill left. has an appoinment next month. Initial call taken by: Lind Guest,  February 14, 2011 10:35 AM  Follow-up for Phone Call        pls refill x3 , let her know Follow-up by: Syliva Overman MD,  February 14, 2011 1:23 PM    Prescriptions: AMLODIPINE BESYLATE 2.5 MG TABS (AMLODIPINE BESYLATE) Take 1 tablet by mouth once a day  #30 x 3   Entered by:   Everitt Amber LPN   Authorized by:   Syliva Overman MD   Signed by:   Everitt Amber LPN on 16/09/9603   Method used:   Electronically to        [facility] [phone]* (retail)       [address]       [place], [place]  [address]       Ph: [phone] or [phone]       Fax: [phone]   RxID:   [id number]

## 2011-03-25 ENCOUNTER — Other Ambulatory Visit: Payer: Self-pay

## 2011-03-25 ENCOUNTER — Telehealth: Payer: Self-pay | Admitting: Family Medicine

## 2011-03-25 MED ORDER — NORETHINDRONE-ETH ESTRADIOL 0.5-35 MG-MCG PO TABS: 1.0000 | ORAL_TABLET | Freq: Every day | ORAL | Status: DC

## 2011-03-25 NOTE — Telephone Encounter (Signed)
Birth control sent to [facility]

## 2011-03-28 ENCOUNTER — Telehealth: Payer: Self-pay | Admitting: Family Medicine

## 2011-03-28 NOTE — Telephone Encounter (Signed)
Refilled birth control

## 2011-04-01 ENCOUNTER — Encounter: Payer: Self-pay | Admitting: Family Medicine

## 2011-04-02 ENCOUNTER — Encounter: Payer: Self-pay | Admitting: Family Medicine

## 2011-04-02 ENCOUNTER — Ambulatory Visit (INDEPENDENT_AMBULATORY_CARE_PROVIDER_SITE_OTHER): Payer: PRIVATE HEALTH INSURANCE | Admitting: Family Medicine

## 2011-04-02 ENCOUNTER — Other Ambulatory Visit (HOSPITAL_COMMUNITY)
Admission: RE | Admit: 2011-04-02 | Discharge: 2011-04-02 | Disposition: A | Discharge status: Home / Self Care | Payer: PRIVATE HEALTH INSURANCE | Source: Ambulatory Visit | Attending: Family Medicine | Admitting: Family Medicine

## 2011-04-02 VITALS — BP 120/70 | HR 72 | Resp 16 | Ht 63.25 in | Wt 166.0 lb

## 2011-04-02 DIAGNOSIS — Z309 Encounter for contraceptive management, unspecified: Secondary | ICD-10-CM

## 2011-04-02 DIAGNOSIS — R933 Abnormal findings on diagnostic imaging of other parts of digestive tract: Secondary | ICD-10-CM

## 2011-04-02 DIAGNOSIS — R5383 Other fatigue: Secondary | ICD-10-CM

## 2011-04-02 DIAGNOSIS — I10 Essential (primary) hypertension: Secondary | ICD-10-CM

## 2011-04-02 DIAGNOSIS — Z0000 Encounter for general adult medical examination without abnormal findings: Secondary | ICD-10-CM

## 2011-04-02 DIAGNOSIS — Z1211 Encounter for screening for malignant neoplasm of colon: Secondary | ICD-10-CM

## 2011-04-02 DIAGNOSIS — R5381 Other malaise: Secondary | ICD-10-CM

## 2011-04-02 DIAGNOSIS — Z124 Encounter for screening for malignant neoplasm of cervix: Secondary | ICD-10-CM

## 2011-04-02 DIAGNOSIS — Z01419 Encounter for gynecological examination (general) (routine) without abnormal findings: Secondary | ICD-10-CM | POA: Insufficient documentation

## 2011-04-02 DIAGNOSIS — K625 Hemorrhage of anus and rectum: Secondary | ICD-10-CM

## 2011-04-02 LAB — POC HEMOCCULT BLD/STL (OFFICE/1-CARD/DIAGNOSTIC): Fecal Occult Blood, POC: NEGATIVE

## 2011-04-02 MED ORDER — TRIAMTERENE-HCTZ 75-50 MG PO TABS: 1.0000 | ORAL_TABLET | Freq: Every day | ORAL | Status: DC

## 2011-04-02 MED ORDER — NORETHINDRONE-ETH ESTRADIOL 0.5-35 MG-MCG PO TABS: 1.0000 | ORAL_TABLET | Freq: Every day | ORAL | Status: DC

## 2011-04-02 MED ORDER — AMLODIPINE BESYLATE 2.5 MG PO TABS: 2.5000 mg | ORAL_TABLET | Freq: Every day | ORAL | Status: DC

## 2011-04-02 NOTE — Patient Instructions (Addendum)
  F/u in 6 months.  Fasting labs cbc, chem 7 , Tsh as soon as possible  You are being referred for a colonoscopy since you have hidden blood in your stool  Contraceptives will be prescribed for only 6 additional months, you really need to stop taking hormone therapy and use alternate methods of contraception at this time

## 2011-04-03 ENCOUNTER — Encounter: Payer: Self-pay | Admitting: Family Medicine

## 2011-04-03 NOTE — Progress Notes (Signed)
  Subjective:    Patient ID: Kathleen Kirby, female    DOB: Mar 06, 1961, 50 y.o.   MRN: [id number]  HPI The PT is here for annual exam and re-evaluation of chronic medical conditions and  medication management  Preventive health is updated, specifically  Cancer screening,  and Immunization.   Questions or concerns regarding consultations or procedures which the PT has had in the interim are  addressed. The PT denies any adverse reactions to current medications since the last visit.  There are no new concerns.  There are no specific complaints. She does walk for at least 150 minutes per week and enjoys this       Review of Systems Denies recent fever or chills. Denies sinus pressure, nasal congestion, ear pain or sore throat. Denies chest congestion, productive cough or wheezing. Denies chest pains, palpitations, paroxysmal nocturnal dyspnea, orthopnea and leg swelling Denies abdominal pain, nausea, vomiting,diarrhea or constipation.  Denies rectal bleeding or change in bowel movement. Denies dysuria, frequency, hesitancy or incontinence. Denies joint pain, swelling and limitation and mobility. Denies headaches, seizure, numbness, or tingling. Denies depression, anxiety or insomnia. Denies skin break down or rash.        Objective:   Physical Exam     Pleasant well nourished female, alert and oriented x 3, in no cardio-pulmonary distress. Afebrile. HEENT No facial trauma or asymetry.   EOMI, PERTL, fundoscopic exam is negative for hemorhages or exudates. External ears normal, tympanic membranes clear. Oropharynx moist, no exudate, good dentition. Neck: supple, no adenopathy,JVD or thyromegaly.No bruits.  Chest: Clear to ascultation bilaterally.No crackles or wheezes. Non tender to palpation  Breast: No asymetry,no masses. No nipple discharge or inversion. No axillary or supraclavicular adenopathy  Cardiovascular system; Heart sounds normal,  S1 and  S2 ,no S3.  No  murmur, or thrill. Apical beat not displaced Peripheral pulses normal.  Abdomen: Soft, non tender, no organomegaly or masses. No bruits. Bowel sounds normal. No guarding, tenderness or rebound.  Rectal:  No mass. guaic positive  stool.  GU: External genitalia normal. No lesions. Vaginal canal normal.No discharge.Old blood in canal, recently ended her menses Uterus normal size, no adnexal masses, no cervical motion or adnexal tenderness.  Musculoskeletal exam: Full ROM of spine, hips , shoulders and knees. No deformity ,swelling or crepitus noted. No muscle wasting or atrophy.   Neurologic: Cranial nerves 2 to 12 intact. Power, tone ,sensation and reflexes normal throughout. No disturbance in gait. No tremor.  Skin: Intact, no ulceration, erythema , scaling or rash noted. Pigmentation normal throughout  Psych; Normal mood and affect. Judgement and concentration normal    Assessment & Plan:  1. Physical exam is normal except for guaic positive stool, pt is being referred to GI for colonoscopy. There is no f/h of colon ca, and she denies any change in her bowel movements 2. Hypertension:Controlled, no changes in medication.  3. Contraception discussed, advised against continued hormonal replacement, due to possible increased risk of breast cancer, still wants to continue with tis for an additional 6 months at least 4. Overweight: the importance of regular exercise and healthy diet were discussed

## 2011-04-03 NOTE — Progress Notes (Signed)
Stool card result entered negative in error, this result was POSITIVE, JBB

## 2011-04-07 ENCOUNTER — Encounter: Payer: Self-pay | Admitting: Internal Medicine

## 2011-04-07 ENCOUNTER — Encounter: Payer: Self-pay | Admitting: *Deleted

## 2011-04-25 ENCOUNTER — Ambulatory Visit: Payer: PRIVATE HEALTH INSURANCE | Admitting: Urgent Care

## 2011-04-25 ENCOUNTER — Encounter: Payer: Self-pay | Admitting: Urgent Care

## 2011-04-25 ENCOUNTER — Ambulatory Visit (INDEPENDENT_AMBULATORY_CARE_PROVIDER_SITE_OTHER): Payer: PRIVATE HEALTH INSURANCE | Admitting: Urgent Care

## 2011-04-25 VITALS — BP 137/82 | HR 82 | Temp 97.2°F | Ht 65.0 in | Wt 174.6 lb

## 2011-04-25 DIAGNOSIS — R195 Other fecal abnormalities: Secondary | ICD-10-CM

## 2011-04-25 NOTE — Assessment & Plan Note (Addendum)
Kathleen Kirby is a 50 y.o. black female found to have hemoccult positive stool on routine PE.  Denies any GI concerns.  No known hx of anemia.  Will need colonoscopy to r/o colorectal carcinoma or polyp.  Rare NSAIDs.  Diagnostic colonoscopy w/ Dr. Darrick Penna in near future.  I have discussed risks & benefits which include, but are not limited to, bleeding, infection, perforation & drug reaction.  The patient agrees with this plan & written consent will be obtained.

## 2011-04-25 NOTE — Progress Notes (Signed)
Referring Provider: Syliva Overman, MD Primary Care Physician:  Syliva Overman, MD, MD Primary Gastroenterologist:  Dr. Darrick Penna  Chief Complaint  Patient presents with  . Rectal Bleeding  . Colon Cancer Screening    HPI:  Kathleen Kirby is a 50 y.o. female here as a referral from Dr. Lodema Hong for hemoccult positive stool.  Denies any hx of anemia.  Found to be heme positive by Dr. Lodema Hong on exam.  Some straining, occ hard stools.  No known hemorrhoids.  Denies any pain, nausea, vomiting, heartburn, or indigestion.  BM QD or QOD.  Ibuprofen/Aleve couple times per month or none at all. Weight stable.  Appetite ok.  Past Medical History  Diagnosis Date  . Hypertension   . Back pain   . Obesity   . Right hip pain   . Bilateral shoulder pain     No past surgical history on file.  Current Outpatient Prescriptions  Medication Sig Dispense Refill  . amLODipine (NORVASC) 2.5 MG tablet Take 1 tablet (2.5 mg total) by mouth daily.  30 tablet  5  . ibuprofen (ADVIL,MOTRIN) 800 MG tablet Take 800 mg by mouth every 8 (eight) hours as needed.        . triamterene-hydrochlorothiazide (MAXZIDE) 75-50 MG per tablet Take 1 tablet by mouth daily.  90 tablet  1  . DISCONTD: norethindrone-ethinyl estradiol (NECON) 0.5-35 MG-MCG per tablet Take 1 tablet by mouth daily.  30 tablet  3    Allergies as of 04/25/2011 - Review Complete 04/25/2011  Allergen Reaction Noted  . Codeine  03/02/2008  . Sulfamethoxazole w/trimethoprim  03/02/2008    Family History: There is no known family history of colorectal carcinoma , liver disease, or inflammatory bowel disease.  Problem Relation Age of Onset  . Coronary artery disease Father   . Lung cancer Brother   . Brain cancer Brother   . Diabetes Sister   . Coronary artery disease Sister   . Hypertension Sister     History   Social History  . Marital Status: Married    Spouse Name: N/A    Number of Children: 0  . Years of Education: N/A    Occupational History  . machine operator     t[employer]   Social History Main Topics  . Smoking status: Never Smoker   . Smokeless tobacco: Not on file  . Alcohol Use: Not on file  . Drug Use: Not on file  . Sexually Active: Yes -- Female partner(s)    Birth Control/ Protection: None   Review of Systems: Gen: Denies any fever, chills, sweats, anorexia, fatigue, weakness, malaise, weight loss, and sleep disorder CV: Denies chest pain, angina, palpitations, syncope, orthopnea, PND, peripheral edema, and claudication. Resp: Denies dyspnea at rest, dyspnea with exercise, cough, sputum, wheezing, coughing up blood, and pleurisy. GI: Denies vomiting blood, jaundice, and fecal incontinence.   Denies dysphagia or odynophagia. GU : Denies urinary burning, blood in urine, urinary frequency, urinary hesitancy, nocturnal urination, and urinary incontinence. MS: Denies joint pain, limitation of movement, and swelling, stiffness, low back pain, extremity pain. Denies muscle weakness, cramps, atrophy.  Derm: Denies rash, itching, dry skin, hives, moles, warts, or unhealing ulcers.  Psych: Denies depression, anxiety, memory loss, suicidal ideation, hallucinations, paranoia, and confusion. Heme: Denies bruising, bleeding, and enlarged lymph nodes.  Physical Exam: BP 137/82  Pulse 82  Temp(Src) 97.2 F (36.2 C) (Tympanic)  Ht 5\' 5"  (1.651 m)  Wt 174 lb 9.6 oz (79.198 kg)  BMI 29.05  kg/m2  LMP 03/27/2011 General:   Alert,  Well-developed, well-nourished, pleasant and cooperative in NAD Head:  Normocephalic and atraumatic. Eyes:  Sclera clear, no icterus.   Conjunctiva pink. Ears:  Normal auditory acuity. Nose:  No deformity, discharge,  or lesions. Mouth:  No deformity or lesions, dentition normal. Neck:  Supple; no masses or thyromegaly. Lungs:  Clear throughout to auscultation.   No wheezes, crackles, or rhonchi. No acute distress. Heart:  Regular rate and rhythm; no murmurs, clicks,  rubs,  or gallops. Abdomen:  Soft, nontender and nondistended. No masses, hepatosplenomegaly or hernias noted. Normal bowel sounds, without guarding, and without rebound.   Rectal:  Deferred until time of colonoscopy.   Msk:  Symmetrical without gross deformities. Normal posture. Pulses:  Normal pulses noted. Extremities:  Without clubbing or edema. Neurologic:  Alert and  oriented x4;  grossly normal neurologically. Skin:  Intact without significant lesions or rashes. Cervical Nodes:  No significant cervical adenopathy. Psych:  Alert and cooperative. Normal mood and affect.

## 2011-04-28 NOTE — Progress Notes (Signed)
Cc to PCP 

## 2011-05-05 NOTE — Progress Notes (Signed)
PT NEEDS TO BE SCHEDULED FOR TCS/?EGD for heme pos stools.

## 2011-05-05 NOTE — Progress Notes (Signed)
Addended by: Ave Filter on: 05/05/2011 04:56 PM   Modules accepted: Orders

## 2011-05-05 NOTE — Progress Notes (Signed)
I called [name] and lm to add poss egd.

## 2011-05-06 NOTE — Progress Notes (Addendum)
LM for return call for pt to discuss possible EGD w/ her. I would like to speak w/ her when she returns call. Thanks  Spoke w/ pt.  She agrees w/ EGD if needed.  I have discussed risks & benefits which include, but are not limited to, bleeding, infection, perforation & drug reaction.

## 2011-05-15 ENCOUNTER — Telehealth: Payer: Self-pay

## 2011-05-15 NOTE — Telephone Encounter (Signed)
Spoke with pt RE: fears about sedation/procedure. Explained risk of the procedure: <1% chance of bleeding, perforation, or medication reaction. Will only perform EGD if no source for heme pos stools can be identified on TCS. Pt voiced her understanding.

## 2011-05-16 ENCOUNTER — Other Ambulatory Visit: Payer: Self-pay | Admitting: Gastroenterology

## 2011-05-16 ENCOUNTER — Ambulatory Visit (HOSPITAL_COMMUNITY)
Admission: RE | Admit: 2011-05-16 | Discharge: 2011-05-16 | Disposition: A | Discharge status: Home / Self Care | Payer: PRIVATE HEALTH INSURANCE | Source: Ambulatory Visit | Attending: Gastroenterology | Admitting: Gastroenterology

## 2011-05-16 ENCOUNTER — Encounter: Payer: PRIVATE HEALTH INSURANCE | Admitting: Gastroenterology

## 2011-05-16 DIAGNOSIS — I10 Essential (primary) hypertension: Secondary | ICD-10-CM | POA: Insufficient documentation

## 2011-05-16 DIAGNOSIS — K625 Hemorrhage of anus and rectum: Secondary | ICD-10-CM

## 2011-05-16 DIAGNOSIS — K921 Melena: Secondary | ICD-10-CM | POA: Insufficient documentation

## 2011-05-16 DIAGNOSIS — A048 Other specified bacterial intestinal infections: Secondary | ICD-10-CM | POA: Insufficient documentation

## 2011-05-16 DIAGNOSIS — D131 Benign neoplasm of stomach: Secondary | ICD-10-CM

## 2011-05-16 DIAGNOSIS — D126 Benign neoplasm of colon, unspecified: Secondary | ICD-10-CM | POA: Insufficient documentation

## 2011-05-16 DIAGNOSIS — K648 Other hemorrhoids: Secondary | ICD-10-CM

## 2011-05-16 DIAGNOSIS — Z79899 Other long term (current) drug therapy: Secondary | ICD-10-CM | POA: Insufficient documentation

## 2011-05-16 DIAGNOSIS — R195 Other fecal abnormalities: Secondary | ICD-10-CM

## 2011-05-16 DIAGNOSIS — K2940 Chronic atrophic gastritis without bleeding: Secondary | ICD-10-CM | POA: Insufficient documentation

## 2011-05-31 ENCOUNTER — Other Ambulatory Visit: Payer: Self-pay | Admitting: Family Medicine

## 2011-05-31 DIAGNOSIS — Z0000 Encounter for general adult medical examination without abnormal findings: Secondary | ICD-10-CM

## 2011-06-04 NOTE — Telephone Encounter (Signed)
Error

## 2011-06-05 NOTE — Progress Notes (Signed)
Reminder in epic to have tcs in 10 years 

## 2011-06-06 ENCOUNTER — Telehealth: Payer: Self-pay

## 2011-06-06 MED ORDER — FLUCONAZOLE 150 MG PO TABS: 150.0000 mg | ORAL_TABLET | Freq: Once | ORAL | Status: AC

## 2011-06-06 NOTE — Telephone Encounter (Signed)
Pt called and said she has a yeast infection from the recent Amoxicillin for H. Pylori. Would like something called to [facility] in [place]. Please  Advise!

## 2011-06-06 NOTE — Telephone Encounter (Signed)
Spoke w/ patient c/o vaginal symptoms

## 2011-06-06 NOTE — Telephone Encounter (Signed)
Informed pt that Kathleen Kirby said that she would send in Rx for yeast infection.

## 2011-06-11 NOTE — Op Note (Signed)
NAMEEUNIQUE, BALIK               ACCOUNT NO.:  [id number]  MEDICAL RECORD NO.:  [id number]           PATIENT TYPE:  O  LOCATION:  DAYP                          FACILITY:  [facility]  PHYSICIAN:  Jonette Eva, M.D.     DATE OF BIRTH:  June 27, 1961  DATE OF PROCEDURE:  05/16/2011 DATE OF DISCHARGE:                              OPERATIVE REPORT   PROCEDURE: 1. Ileocolonoscopy with snare cautery and cold forceps polypectomy. 2. Esophagogastroduodenoscopy with snare cautery polypectomy.  INDICATION FOR EXAM:  Ms. Hankins is a 50 year old female who presented with intermittent rectal bleeding.  She had rectal bleeding when she strains, occurred once a month.  In June 2011, she had a hemoglobin of 12.3 with MCV of 78.7.  In March 2010, she had hemoglobin of 11.8 with MCV of 80.2.  She was seen and evaluated in April 2012 by her primary physician and was noted to be heme positive.  FINDINGS: 1. Normal terminal ileum. 2. A 6-mm sessile ascending colon polyp removed via snare cautery.  A     2-mm sessile rectal polyp removed via cold forceps.  Otherwise, no     masses, inflammatory changes, diverticula, or AVMs seen. 3. Small internal hemorrhoids.  Otherwise, normal retroflexed view of     the rectum. 4. Normal esophagus without evidence of Barrett's, mass, erosion,     ulceration, or stricture. 5. A 1-cm pedunculated gastric polyp removed via snare cautery.  Other     small polyps were identified and appeared to be fundic gland     polyps.  No evidence of erosions or ulcerations seen in the antrum.     The large gastric polyp was at the level of the angularis on the     greater curvature of the stomach. 6. Normal duodenal bulb and second portion of the duodenum.  DIAGNOSES: 1. Rectal bleeding most likely secondary to small internal     hemorrhoids. 2. Heme-positive stools most likely secondary to internal hemorrhoids,     colon and gastric polyps.  RECOMMENDATIONS: 1. No aspirin  anticoagulation for 3 days. 2. Screening colonoscopy in 10 years. 3. We will call the results of her biopsies. 4. She should follow a high-fiber diet.  She was given a handout on     high-fiber diet, polyps, and hemorrhoids.  MEDICATIONS: 1. Demerol 100 mg IV. 2. Versed 6 mg IV.  PROCEDURE TECHNIQUE:  Physical exam was performed.  Informed consent was obtained from the patient after explaining the benefits, risks, and alternatives to the procedure.  The patient was connected to the monitor and placed in left the lateral position.  Continuous oxygen was provided by nasal cannula and IV medicine administered through an indwelling cannula.  After administration of sedation and rectal exam, the patient's rectum was intubated and the scope was advanced under direct visualization to the distal terminal ileum.  The scope was removed slowly by carefully examining the color, texture, anatomy, and integrity of the mucosa on the way out.  After colonoscopy, the patient's esophagus was intubated with a diagnostic gastroscope.  The scope was advanced under direct visualization to the second portion  of the duodenum.  The scope was removed slowly by carefully examining the color, texture, anatomy, and integrity of the mucosa on the way out.  The patient was recovered in endoscopy and discharged home in satisfactory condition.   PATH: SIMPLE ADENOMA, H.PYLORI GASTRITIS-Rx: ABO x10 days.  Jonette Eva, M.D.     SF/MEDQ  D:  05/16/2011  T:  05/17/2011  Job:  [id number]  cc:   Milus Mallick. Lodema Hong, M.D. Fax: [phone]  Electronically Signed by Jonette Eva M.D. on 06/11/2011 02:25:05 PM

## 2011-06-13 ENCOUNTER — Ambulatory Visit (HOSPITAL_COMMUNITY)
Admission: RE | Admit: 2011-06-13 | Discharge: 2011-06-13 | Disposition: A | Discharge status: Home / Self Care | Payer: PRIVATE HEALTH INSURANCE | Source: Ambulatory Visit | Attending: Family Medicine | Admitting: Family Medicine

## 2011-06-13 ENCOUNTER — Other Ambulatory Visit: Payer: Self-pay | Admitting: Family Medicine

## 2011-06-13 DIAGNOSIS — Z1231 Encounter for screening mammogram for malignant neoplasm of breast: Secondary | ICD-10-CM | POA: Insufficient documentation

## 2011-06-13 DIAGNOSIS — Z0000 Encounter for general adult medical examination without abnormal findings: Secondary | ICD-10-CM

## 2011-06-14 LAB — CBC WITH DIFFERENTIAL/PLATELET
Hemoglobin: 11.5 g/dL — ABNORMAL LOW (ref 12.0–15.0)
Lymphs Abs: 1.8 10*3/uL (ref 0.7–4.0)
Monocytes Relative: 9 % (ref 3–12)
Neutro Abs: 2.4 10*3/uL (ref 1.7–7.7)
Neutrophils Relative %: 51 % (ref 43–77)
Platelets: 311 10*3/uL (ref 150–400)
RBC: 4.66 MIL/uL (ref 3.87–5.11)
WBC: 4.7 10*3/uL (ref 4.0–10.5)

## 2011-06-14 LAB — BASIC METABOLIC PANEL
Calcium: 9.3 mg/dL (ref 8.4–10.5)
Creat: 0.68 mg/dL (ref 0.50–1.10)

## 2011-06-14 LAB — TSH: TSH: 0.877 u[IU]/mL (ref 0.350–4.500)

## 2011-06-17 LAB — ANEMIA PANEL
Ferritin: 25 ng/mL (ref 10–291)
Folate: 15.9 ng/mL

## 2011-06-17 LAB — HEMOGLOBIN A1C: Mean Plasma Glucose: 160 mg/dL — ABNORMAL HIGH (ref <117)

## 2011-06-25 ENCOUNTER — Telehealth: Payer: Self-pay | Admitting: *Deleted

## 2011-06-25 NOTE — Telephone Encounter (Signed)
Patient aware and will discuss at diabetic teaching on Friday

## 2011-06-25 NOTE — Telephone Encounter (Signed)
Message copied by Diamantina Monks on Wed Jun 25, 2011  4:43 PM ------      Message from: Syliva Overman MD E      Created: Thu Jun 19, 2011  3:37 PM       pls explain to her that HBA1C of 7.2 is ABOVE treatment goal for diabetesd.      Normal is 5.7 or less. She does have a fam h/o diabetes which increases her risk.       At7.2 , I advise her to start the medication , and until her value is under 7.0, preferably 6.5, she should stay on it.      i strongly support the lifestyle changes, but for some people that is insufficeint.      She absolutely need rept hBA1C and OV in 12 weeks.      I do not need to hear her final decision, and she may want to think about this some more and you call you back.      She does need to start testing her blood sugars and if needed send in supplies, should also go to class

## 2011-09-24 ENCOUNTER — Telehealth: Payer: Self-pay | Admitting: Family Medicine

## 2011-09-26 NOTE — Telephone Encounter (Signed)
pls let pt know she does need fasting labs, I have written the order on a script ,she can collect, try mobile # no answer at home pls

## 2011-09-29 ENCOUNTER — Ambulatory Visit: Payer: PRIVATE HEALTH INSURANCE | Admitting: Family Medicine

## 2011-09-29 NOTE — Telephone Encounter (Signed)
Left message and also took lab order over to the lab left her with that message also

## 2011-10-17 ENCOUNTER — Other Ambulatory Visit: Payer: Self-pay | Admitting: Family Medicine

## 2011-10-17 LAB — LIPID PANEL
Cholesterol: 200 mg/dL (ref 0–200)
HDL: 68 mg/dL (ref >39)
Total CHOL/HDL Ratio: 2.9 Ratio
VLDL: 10 mg/dL (ref 0–40)

## 2011-10-17 LAB — COMPLETE METABOLIC PANEL WITH GFR
AST: 24 U/L (ref 0–37)
Albumin: 3.9 g/dL (ref 3.5–5.2)
Alkaline Phosphatase: 67 U/L (ref 39–117)
BUN: 15 mg/dL (ref 6–23)
Potassium: 4.1 mEq/L (ref 3.5–5.3)
Sodium: 139 mEq/L (ref 135–145)
Total Bilirubin: 0.4 mg/dL (ref 0.3–1.2)
Total Protein: 6.4 g/dL (ref 6.0–8.3)

## 2011-10-22 ENCOUNTER — Encounter: Payer: Self-pay | Admitting: Family Medicine

## 2011-10-23 ENCOUNTER — Encounter: Payer: Self-pay | Admitting: Family Medicine

## 2011-10-23 ENCOUNTER — Ambulatory Visit (INDEPENDENT_AMBULATORY_CARE_PROVIDER_SITE_OTHER): Payer: PRIVATE HEALTH INSURANCE | Admitting: Family Medicine

## 2011-10-23 VITALS — BP 122/82 | HR 80 | Resp 16 | Ht 63.5 in | Wt 159.4 lb

## 2011-10-23 DIAGNOSIS — E663 Overweight: Secondary | ICD-10-CM

## 2011-10-23 DIAGNOSIS — R7302 Impaired glucose tolerance (oral): Secondary | ICD-10-CM

## 2011-10-23 DIAGNOSIS — R7301 Impaired fasting glucose: Secondary | ICD-10-CM

## 2011-10-23 DIAGNOSIS — Z6825 Body mass index (BMI) 25.0-25.9, adult: Secondary | ICD-10-CM

## 2011-10-23 DIAGNOSIS — I10 Essential (primary) hypertension: Secondary | ICD-10-CM

## 2011-10-23 DIAGNOSIS — E669 Obesity, unspecified: Secondary | ICD-10-CM

## 2011-10-23 DIAGNOSIS — R7309 Other abnormal glucose: Secondary | ICD-10-CM

## 2011-10-23 NOTE — Patient Instructions (Signed)
F/U in 4months  HBa1C and chem 7 non fasting  In 4 months  It is important that you exercise regularly at least 30 minutes 5 times a week. If you develop chest pain, have severe difficulty breathing, or feel very tired, stop exercising immediately and seek medical attention   A healthy diet is rich in fruit, vegetables and whole grains. Poultry fish, nuts and beans are a healthy choice for protein rather then red meat. A low sodium diet and drinking 64 ounces of water daily is generally recommended. Oils and sweet should be limited. Carbohydrates especially for those who are diabetic or overweight, should be limited to 34-45 gram per meal. It is important to eat on a regular schedule, at least 3 times daily. Snacks should be primarily fruits, vegetables or nuts.   I will call today with the labs

## 2011-10-23 NOTE — Progress Notes (Signed)
  Subjective:    Patient ID: Kathleen Kirby, female    DOB: 03/12/1961, 50 y.o.   MRN: [id number]  HPI The PT is here for follow up and re-evaluation of chronic medical conditions, medication management and review of any available recent lab and radiology data.  Preventive health is updated, specifically  Cancer screening and Immunization.   Questions or concerns regarding consultations or procedures which the PT has had in the interim are  addressed. The PT denies any adverse reactions to current medications since the last visit.  Pt diagnosed as diabetic several months ago with HBA1C over 7, she has worked on dietary change ad exercise , and is anxious to hear results of lab work repeated, which has her HBa1C now under 7, she is excited and with great reason       Review of Systems See HPI Denies recent fever or chills. Denies sinus pressure, nasal congestion, ear pain or sore throat. Denies chest congestion, productive cough or wheezing. Denies chest pains, palpitations and leg swelling Denies abdominal pain, nausea, vomiting,diarrhea or constipation.   Denies dysuria, frequency, hesitancy or incontinence. Denies joint pain, swelling and limitation in mobility. Denies headaches, seizures, numbness, or tingling. Denies depression, anxiety or insomnia. Denies skin break down or rash.        Objective:   Physical Exam Patient alert and oriented and in no cardiopulmonary distress.  HEENT: No facial asymmetry, EOMI, no sinus tenderness,  oropharynx pink and moist.  Neck supple no adenopathy.  Chest: Clear to auscultation bilaterally.  CVS: S1, S2 no murmurs, no S3.  ABD: Soft non tender. Bowel sounds normal.  Ext: No edema  MS: Adequate ROM spine, shoulders, hips and knees.  Skin: Intact, no ulcerations or rash noted.  Psych: Good eye contact, normal affect. Memory intact not anxious or depressed appearing.  CNS: CN 2-12 intact, power, tone and sensation normal  throughout.        Assessment & Plan:

## 2011-10-26 DIAGNOSIS — R7302 Impaired glucose tolerance (oral): Secondary | ICD-10-CM | POA: Insufficient documentation

## 2011-10-26 NOTE — Assessment & Plan Note (Signed)
Controlled, no change in medication  

## 2011-10-26 NOTE — Assessment & Plan Note (Signed)
Pt has worked Social worker on lifestyle change, with marked improvement in HBA1C, she will continue this and rept labs in 4 month

## 2011-10-26 NOTE — Assessment & Plan Note (Signed)
Improved. Pt applauded on succesful weight loss through lifestyle change, and encouraged to continue same. Weight loss goal set for the next several months.  

## 2011-10-29 ENCOUNTER — Encounter: Payer: Self-pay | Admitting: Family Medicine

## 2011-11-20 ENCOUNTER — Other Ambulatory Visit: Payer: Self-pay | Admitting: Family Medicine

## 2012-02-26 ENCOUNTER — Ambulatory Visit: Payer: PRIVATE HEALTH INSURANCE | Admitting: Family Medicine

## 2012-04-17 LAB — HEMOGLOBIN A1C: Hgb A1c MFr Bld: 6 % — ABNORMAL HIGH (ref <5.7)

## 2012-04-17 LAB — BASIC METABOLIC PANEL
Calcium: 9.2 mg/dL (ref 8.4–10.5)
Creat: 0.72 mg/dL (ref 0.50–1.10)
Sodium: 140 mEq/L (ref 135–145)

## 2012-04-22 ENCOUNTER — Encounter: Payer: Self-pay | Admitting: Family Medicine

## 2012-04-22 ENCOUNTER — Ambulatory Visit (INDEPENDENT_AMBULATORY_CARE_PROVIDER_SITE_OTHER): Payer: PRIVATE HEALTH INSURANCE | Admitting: Family Medicine

## 2012-04-22 VITALS — BP 130/80 | HR 74 | Resp 15 | Ht 62.25 in | Wt 154.8 lb

## 2012-04-22 DIAGNOSIS — R7302 Impaired glucose tolerance (oral): Secondary | ICD-10-CM

## 2012-04-22 DIAGNOSIS — E669 Obesity, unspecified: Secondary | ICD-10-CM

## 2012-04-22 DIAGNOSIS — E8881 Metabolic syndrome: Secondary | ICD-10-CM

## 2012-04-22 DIAGNOSIS — I10 Essential (primary) hypertension: Secondary | ICD-10-CM

## 2012-04-22 DIAGNOSIS — R5381 Other malaise: Secondary | ICD-10-CM

## 2012-04-22 DIAGNOSIS — R7309 Other abnormal glucose: Secondary | ICD-10-CM

## 2012-04-22 NOTE — Progress Notes (Signed)
  Subjective:    Patient ID: Kathleen Kirby, female    DOB: [birth year], 51 y.o.   MRN: [id number]  HPI The PT is here for follow up and re-evaluation of chronic medical conditions, medication management and review of any available recent lab and radiology data.  Preventive health is updated, specifically  Cancer screening and Immunization.   Questions or concerns regarding consultations or procedures which the PT has had in the interim are  addressed. The PT denies any adverse reactions to current medications since the last visit.  There are no new concerns.  There are no specific complaints       Review of Systems See HPI Denies recent fever or chills. Denies sinus pressure, nasal congestion, ear pain or sore throat. Denies chest congestion, productive cough or wheezing. Denies chest pains, palpitations and leg swelling Denies abdominal pain, nausea, vomiting,diarrhea or constipation.   Denies dysuria, frequency, hesitancy or incontinence. Denies joint pain, swelling and limitation in mobility. Denies headaches, seizures, numbness, or tingling. Denies depression, anxiety or insomnia. Denies skin break down or rash.        Objective:   Physical Exam  Patient alert and oriented and in no cardiopulmonary distress.  HEENT: No facial asymmetry, EOMI, no sinus tenderness,  oropharynx pink and moist.  Neck supple no adenopathy.  Chest: Clear to auscultation bilaterally.  CVS: S1, S2 no murmurs, no S3.  ABD: Soft non tender. Bowel sounds normal.  Ext: No edema  MS: Adequate ROM spine, shoulders, hips and knees.  Skin: Intact, no ulcerations or rash noted.  Psych: Good eye contact, normal affect. Memory intact not anxious or depressed appearing.  CNS: CN 2-12 intact, power, tone and sensation normal throughout.       Assessment & Plan:

## 2012-04-22 NOTE — Patient Instructions (Signed)
CPE in end/September or October.  Please call if you need me before  CONGRATS on excellent blood pressure and improved blood sugars, keep it up!  No changes in medication at this time.  CBC, fasting chem 7, lipid, HBA1Cand TSH approximately 1 week before next vist.  Mammogram due June 30 or after , please schedule.  Try to increase duration of exercise and intensity, to help with weight loss

## 2012-04-24 DIAGNOSIS — E8881 Metabolic syndrome: Secondary | ICD-10-CM | POA: Insufficient documentation

## 2012-04-24 NOTE — Assessment & Plan Note (Signed)
Improved. Pt applauded on succesful weight loss through lifestyle change, and encouraged to continue same. Weight loss goal set for the next several months.  

## 2012-04-24 NOTE — Assessment & Plan Note (Signed)
The importance of weight loss and lifestyle change to improve this is stressed

## 2012-04-24 NOTE — Assessment & Plan Note (Signed)
Improved with lifestyle change only

## 2012-04-24 NOTE — Assessment & Plan Note (Signed)
Controlled, no change in medication  

## 2012-05-07 ENCOUNTER — Other Ambulatory Visit: Payer: Self-pay | Admitting: Family Medicine

## 2012-06-02 ENCOUNTER — Other Ambulatory Visit: Payer: Self-pay | Admitting: Family Medicine

## 2012-06-15 ENCOUNTER — Other Ambulatory Visit: Payer: Self-pay | Admitting: Family Medicine

## 2012-06-15 DIAGNOSIS — Z139 Encounter for screening, unspecified: Secondary | ICD-10-CM

## 2012-06-18 ENCOUNTER — Ambulatory Visit (HOSPITAL_COMMUNITY)
Admission: RE | Admit: 2012-06-18 | Discharge: 2012-06-18 | Disposition: A | Discharge status: Home / Self Care | Payer: PRIVATE HEALTH INSURANCE | Source: Ambulatory Visit | Attending: Family Medicine | Admitting: Family Medicine

## 2012-06-18 DIAGNOSIS — Z1231 Encounter for screening mammogram for malignant neoplasm of breast: Secondary | ICD-10-CM | POA: Insufficient documentation

## 2012-06-18 DIAGNOSIS — Z139 Encounter for screening, unspecified: Secondary | ICD-10-CM

## 2012-07-12 ENCOUNTER — Ambulatory Visit (INDEPENDENT_AMBULATORY_CARE_PROVIDER_SITE_OTHER): Payer: PRIVATE HEALTH INSURANCE | Admitting: Family Medicine

## 2012-07-12 ENCOUNTER — Encounter: Payer: Self-pay | Admitting: Family Medicine

## 2012-07-12 VITALS — BP 118/78 | HR 85 | Resp 16 | Ht 62.25 in | Wt 156.1 lb

## 2012-07-12 DIAGNOSIS — I10 Essential (primary) hypertension: Secondary | ICD-10-CM

## 2012-07-12 DIAGNOSIS — Z13220 Encounter for screening for lipoid disorders: Secondary | ICD-10-CM

## 2012-07-12 DIAGNOSIS — R7309 Other abnormal glucose: Secondary | ICD-10-CM

## 2012-07-12 DIAGNOSIS — E669 Obesity, unspecified: Secondary | ICD-10-CM

## 2012-07-12 DIAGNOSIS — R5383 Other fatigue: Secondary | ICD-10-CM

## 2012-07-12 DIAGNOSIS — R5381 Other malaise: Secondary | ICD-10-CM

## 2012-07-12 DIAGNOSIS — N390 Urinary tract infection, site not specified: Secondary | ICD-10-CM

## 2012-07-12 DIAGNOSIS — R7302 Impaired glucose tolerance (oral): Secondary | ICD-10-CM

## 2012-07-12 DIAGNOSIS — N3000 Acute cystitis without hematuria: Secondary | ICD-10-CM

## 2012-07-12 LAB — POCT URINALYSIS DIPSTICK
Bilirubin, UA: NEGATIVE
Ketones, UA: NEGATIVE

## 2012-07-12 MED ORDER — CIPROFLOXACIN HCL 500 MG PO TABS: 500.0000 mg | ORAL_TABLET | Freq: Two times a day (BID) | ORAL | Status: AC

## 2012-07-12 MED ORDER — FLUCONAZOLE 150 MG PO TABS: ORAL_TABLET | ORAL | Status: AC

## 2012-07-12 NOTE — Patient Instructions (Addendum)
CPE in [place]  You have a urinary tract infection 5 day course of antibiotic prescribed, take all, also drink a lot of water and void often.  Work excuse from today, return 07/14/2012 Fasting cbc, chem 7, hBA1c , lipids and TSH end October

## 2012-07-15 LAB — URINE CULTURE: Colony Count: 30000

## 2012-07-20 NOTE — Assessment & Plan Note (Signed)
Improved HBA1C and weight loss, pt encouraged to continue same. Updated lab before next visit Patient educated about the importance of limiting  Carbohydrate intake , the need to commit to daily physical activity for a minimum of 30 minutes , and to commit weight loss. The fact that changes in all these areas will reduce or eliminate all together the development of diabetes is stressed.  ;

## 2012-07-20 NOTE — Assessment & Plan Note (Signed)
Improved. Pt applauded on succesful weight loss through lifestyle change, and encouraged to continue same. Weight loss goal set for the next several months.  

## 2012-07-20 NOTE — Assessment & Plan Note (Signed)
Symptomatic with abnormal CCUA will treat presumptively

## 2012-07-20 NOTE — Assessment & Plan Note (Signed)
Controlled, no change in medication DASH diet and commitment to daily physical activity for a minimum of 30 minutes discussed and encouraged, as a part of hypertension management. The importance of attaining a healthy weight is also discussed.  

## 2012-07-20 NOTE — Progress Notes (Signed)
  Subjective:    Patient ID: Kathleen Kirby, female    DOB: 1961/06/05, 51 y.o.   MRN: [id number]  HPI 5 day h/o fatigue, frequency and pressure with urination, also voiding small amounts. No visible blood in urine though looks dark, and also denies fever, flank pain , nausea or vomiting, no chills. Has worked consistently on lifestyle change with good weight loss   Review of Systems See HPI Denies recent fever or chills. Denies sinus pressure, nasal congestion, ear pain or sore throat. Denies chest congestion, productive cough or wheezing. Denies chest pains, palpitations and leg swelling Denies abdominal pain, nausea, vomiting,diarrhea or constipation.   . Denies joint pain, swelling and limitation in mobility. Denies headaches, seizures, numbness, or tingling. Denies depression, anxiety or insomnia. Denies skin break down or rash.        Objective:   Physical Exam Patient alert and oriented and in no cardiopulmonary distress.  HEENT: No facial asymmetry, EOMI, no sinus tenderness,  oropharynx pink and moist.  Neck supple no adenopathy.  Chest: Clear to auscultation bilaterally.  CVS: S1, S2 no murmurs, no S3.  ABD: Soft suprapubic tenderness, no renal angle tenderness. Bowel sounds normal.  Ext: No edema  MS: Adequate ROM spine, shoulders, hips and knees.  Skin: Intact, no ulcerations or rash noted.  Psych: Good eye contact, normal affect. Memory intact not anxious or depressed appearing.  CNS: CN 2-12 intact, power, tone and sensation normal throughout.        Assessment & Plan:

## 2012-10-19 ENCOUNTER — Encounter: Payer: PRIVATE HEALTH INSURANCE | Admitting: Family Medicine

## 2012-11-26 ENCOUNTER — Other Ambulatory Visit: Payer: Self-pay | Admitting: Family Medicine

## 2012-11-27 LAB — BASIC METABOLIC PANEL
BUN: 13 mg/dL (ref 6–23)
Potassium: 3.2 mEq/L — ABNORMAL LOW (ref 3.5–5.3)
Sodium: 137 mEq/L (ref 135–145)

## 2012-11-27 LAB — CBC
MCHC: 32.1 g/dL (ref 30.0–36.0)
Platelets: 305 10*3/uL (ref 150–400)
RDW: 14.2 % (ref 11.5–15.5)

## 2012-11-27 LAB — HEMOGLOBIN A1C: Mean Plasma Glucose: 131 mg/dL — ABNORMAL HIGH (ref <117)

## 2012-11-27 LAB — LIPID PANEL
Cholesterol: 198 mg/dL (ref 0–200)
Total CHOL/HDL Ratio: 2.7 Ratio
Triglycerides: 40 mg/dL (ref <150)
VLDL: 8 mg/dL (ref 0–40)

## 2012-11-30 ENCOUNTER — Encounter: Payer: Self-pay | Admitting: Family Medicine

## 2012-11-30 ENCOUNTER — Ambulatory Visit (INDEPENDENT_AMBULATORY_CARE_PROVIDER_SITE_OTHER): Payer: PRIVATE HEALTH INSURANCE | Admitting: Family Medicine

## 2012-11-30 ENCOUNTER — Other Ambulatory Visit (HOSPITAL_COMMUNITY)
Admission: RE | Admit: 2012-11-30 | Discharge: 2012-11-30 | Disposition: A | Discharge status: Home / Self Care | Payer: PRIVATE HEALTH INSURANCE | Source: Ambulatory Visit | Attending: Family Medicine | Admitting: Family Medicine

## 2012-11-30 VITALS — BP 122/84 | HR 67 | Resp 15 | Ht 62.5 in | Wt 153.0 lb

## 2012-11-30 DIAGNOSIS — E663 Overweight: Secondary | ICD-10-CM

## 2012-11-30 DIAGNOSIS — Z1151 Encounter for screening for human papillomavirus (HPV): Secondary | ICD-10-CM | POA: Insufficient documentation

## 2012-11-30 DIAGNOSIS — I10 Essential (primary) hypertension: Secondary | ICD-10-CM

## 2012-11-30 DIAGNOSIS — Z6825 Body mass index (BMI) 25.0-25.9, adult: Secondary | ICD-10-CM

## 2012-11-30 DIAGNOSIS — Z1211 Encounter for screening for malignant neoplasm of colon: Secondary | ICD-10-CM

## 2012-11-30 DIAGNOSIS — Z124 Encounter for screening for malignant neoplasm of cervix: Secondary | ICD-10-CM

## 2012-11-30 DIAGNOSIS — E8881 Metabolic syndrome: Secondary | ICD-10-CM

## 2012-11-30 DIAGNOSIS — R5381 Other malaise: Secondary | ICD-10-CM

## 2012-11-30 DIAGNOSIS — Z0000 Encounter for general adult medical examination without abnormal findings: Secondary | ICD-10-CM

## 2012-11-30 DIAGNOSIS — R5383 Other fatigue: Secondary | ICD-10-CM

## 2012-11-30 DIAGNOSIS — Z01419 Encounter for gynecological examination (general) (routine) without abnormal findings: Secondary | ICD-10-CM | POA: Insufficient documentation

## 2012-11-30 MED ORDER — TRIAMTERENE-HCTZ 75-50 MG PO TABS: 1.0000 | ORAL_TABLET | Freq: Every day | ORAL | Status: DC

## 2012-11-30 MED ORDER — AMLODIPINE BESYLATE 2.5 MG PO TABS: ORAL_TABLET | ORAL | Status: DC

## 2012-11-30 NOTE — Assessment & Plan Note (Signed)
Improved. Pt applauded on succesful weight loss through lifestyle change, and encouraged to continue same. Weight loss goal set for the next several months.  

## 2012-11-30 NOTE — Progress Notes (Signed)
  Subjective:    Patient ID: Kathleen Kirby, female    DOB: February 12, 1961, 51 y.o.   MRN: [id number]  HPI The PT is here for annual exam and re-evaluation of chronic medical conditions, medication management and review of any available recent lab and radiology data. Needs to start supplemental potassium due to mild hypokalemia Preventive health is updated, specifically  Cancer screening and Immunization.   Questions or concerns regarding consultations or procedures which the PT has had in the interim are  addressed. The PT denies any adverse reactions to current medications since the last visit.  There are no new concerns.  There are no specific complaints       Review of Systems See HPI Denies recent fever or chills. Denies sinus pressure, nasal congestion, ear pain or sore throat. Denies chest congestion, productive cough or wheezing. Denies chest pains, palpitations and leg swelling Denies abdominal pain, nausea, vomiting,diarrhea or constipation.   Denies dysuria, frequency, hesitancy or incontinence. Denies joint pain, swelling and limitation in mobility. Denies headaches, seizures, numbness, or tingling. Denies depression, anxiety or insomnia. Denies skin break down or rash.        Objective:   Physical Exam  Pleasant well nourished female, alert and oriented x 3, in no cardio-pulmonary distress. Afebrile. HEENT No facial trauma or asymetry. Sinuses non tender.  EOMI, PERTL, fundoscopic exam is normal, no hemorhage or exudate.  External ears normal, tympanic membranes clear. Oropharynx moist, no exudate, good dentition. Neck: supple, no adenopathy,JVD or thyromegaly.No bruits.  Chest: Clear to ascultation bilaterally.No crackles or wheezes. Non tender to palpation  Breast: No asymetry,no masses. No nipple discharge or inversion. No axillary or supraclavicular adenopathy  Cardiovascular system; Heart sounds normal,  S1 and  S2 ,no S3.  No murmur, or  thrill. Apical beat not displaced Peripheral pulses normal.  Abdomen: Soft, non tender, no organomegaly or masses. No bruits. Bowel sounds normal. No guarding, tenderness or rebound.  Rectal:  No mass. Guaiac negative stool.  GU: External genitalia normal. No lesions. Vaginal canal normal.No discharge. Uterus normal size, no adnexal masses, no cervical motion or adnexal tenderness.  Musculoskeletal exam: Full ROM of spine, hips , shoulders and knees. No deformity ,swelling or crepitus noted. No muscle wasting or atrophy.   Neurologic: Cranial nerves 2 to 12 intact. Power, tone ,sensation and reflexes normal throughout. No disturbance in gait. No tremor.  Skin: Intact, no ulceration, erythema , scaling or rash noted. Pigmentation normal throughout  Psych; Normal mood and affect. Judgement and concentration normal       Assessment & Plan:

## 2012-11-30 NOTE — Patient Instructions (Signed)
F/u in 4.5 month, please call if you need me before  Congrats on excellent health habits, keep up regular exercise and weight loss  Fasting chem 7 and HBA1C in 4.5 month   You need to start over the counter potassium 99mg  two daily, your potassium was slightly low. We will also provide you with a list of foods which are rich in potassium  New blood pressure med when you next get is maxzide 50mg  ONE daily (same dose as currently taking , but the tablet is stronger)  Please get the flu vaccine at your pharmacy

## 2012-11-30 NOTE — Assessment & Plan Note (Signed)
Pelvic and breast exam completed and documented. Well health issues discussed, needs flu vaccine, cancer screening and labs up to date. Pt encouraged to continue her good health practices of daily exercise at least 6 days per week and care with diet to facilitate weight loss and improved health

## 2013-03-25 ENCOUNTER — Telehealth: Payer: Self-pay | Admitting: Family Medicine

## 2013-03-25 MED ORDER — AMLODIPINE BESYLATE 2.5 MG PO TABS: ORAL_TABLET | ORAL | Status: DC

## 2013-03-25 NOTE — Telephone Encounter (Signed)
Sent in

## 2013-03-31 ENCOUNTER — Telehealth: Payer: Self-pay | Admitting: Family Medicine

## 2013-03-31 ENCOUNTER — Other Ambulatory Visit: Payer: Self-pay

## 2013-03-31 DIAGNOSIS — I10 Essential (primary) hypertension: Secondary | ICD-10-CM

## 2013-03-31 MED ORDER — TRIAMTERENE-HCTZ 75-50 MG PO TABS: 1.0000 | ORAL_TABLET | Freq: Every day | ORAL | Status: DC

## 2013-03-31 MED ORDER — AMLODIPINE BESYLATE 2.5 MG PO TABS: ORAL_TABLET | ORAL | Status: DC

## 2013-03-31 NOTE — Telephone Encounter (Signed)
Since no meds were named in the message I sent both to [facility] and both a short supply to [facility]

## 2013-04-14 ENCOUNTER — Ambulatory Visit: Payer: PRIVATE HEALTH INSURANCE | Admitting: Family Medicine

## 2013-05-06 ENCOUNTER — Other Ambulatory Visit: Payer: Self-pay | Admitting: Family Medicine

## 2013-05-06 LAB — LIPID PANEL
Total CHOL/HDL Ratio: 3 Ratio
VLDL: 8 mg/dL (ref 0–40)

## 2013-05-06 LAB — TSH: TSH: 1.056 u[IU]/mL (ref 0.350–4.500)

## 2013-05-06 LAB — BASIC METABOLIC PANEL
CO2: 31 mEq/L (ref 19–32)
Calcium: 9.9 mg/dL (ref 8.4–10.5)
Glucose, Bld: 109 mg/dL — ABNORMAL HIGH (ref 70–99)
Sodium: 139 mEq/L (ref 135–145)

## 2013-05-06 LAB — CBC
Hemoglobin: 13 g/dL (ref 12.0–15.0)
MCH: 25.5 pg — ABNORMAL LOW (ref 26.0–34.0)
RBC: 5.1 MIL/uL (ref 3.87–5.11)

## 2013-05-07 LAB — HEMOGLOBIN A1C: Hgb A1c MFr Bld: 5.8 % — ABNORMAL HIGH (ref <5.7)

## 2013-05-12 ENCOUNTER — Encounter: Payer: Self-pay | Admitting: Family Medicine

## 2013-05-12 ENCOUNTER — Ambulatory Visit (INDEPENDENT_AMBULATORY_CARE_PROVIDER_SITE_OTHER): Payer: Commercial Managed Care - PPO | Admitting: Family Medicine

## 2013-05-12 VITALS — BP 122/80 | HR 68 | Resp 18 | Ht 62.5 in | Wt 157.1 lb

## 2013-05-12 DIAGNOSIS — I10 Essential (primary) hypertension: Secondary | ICD-10-CM

## 2013-05-12 DIAGNOSIS — E785 Hyperlipidemia, unspecified: Secondary | ICD-10-CM

## 2013-05-12 DIAGNOSIS — E663 Overweight: Secondary | ICD-10-CM

## 2013-05-12 DIAGNOSIS — R7302 Impaired glucose tolerance (oral): Secondary | ICD-10-CM

## 2013-05-12 DIAGNOSIS — Z6825 Body mass index (BMI) 25.0-25.9, adult: Secondary | ICD-10-CM

## 2013-05-12 DIAGNOSIS — E8881 Metabolic syndrome: Secondary | ICD-10-CM

## 2013-05-12 DIAGNOSIS — R7309 Other abnormal glucose: Secondary | ICD-10-CM

## 2013-05-12 NOTE — Progress Notes (Signed)
  Subjective:    Patient ID: Kathleen Kirby, female    DOB: 17-Feb-1961, 52 y.o.   MRN: [id number]  HPI The PT is here for follow up and re-evaluation of chronic medical conditions, medication management and review of any available recent lab and radiology data.  Preventive health is updated, specifically  Cancer screening and Immunization.   Questions or concerns regarding consultations or procedures which the PT has had in the interim are  addressed. The PT denies any adverse reactions to current medications since the last visit.  Has not been as consistent with healthy eating and exercise but will work on this There are no specific complaints       Review of Systems See HPI Denies recent fever or chills. Denies sinus pressure, nasal congestion, ear pain or sore throat. Denies chest congestion, productive cough or wheezing. Denies chest pains, palpitations and leg swelling Denies abdominal pain, nausea, vomiting,diarrhea or constipation.   Denies dysuria, frequency, hesitancy or incontinence. Denies joint pain, swelling and limitation in mobility. Denies headaches, seizures, numbness, or tingling. Denies depression, anxiety or insomnia. Denies skin break down or rash.        Objective:   Physical Exam Patient alert and oriented and in no cardiopulmonary distress.  HEENT: No facial asymmetry, EOMI, no sinus tenderness,  oropharynx pink and moist.  Neck supple no adenopathy.  Chest: Clear to auscultation bilaterally.  CVS: S1, S2 no murmurs, no S3.  ABD: Soft non tender. Bowel sounds normal.  Ext: No edema  MS: Adequate ROM spine, shoulders, hips and knees.  Skin: Intact, no ulcerations or rash noted.  Psych: Good eye contact, normal affect. Memory intact not anxious or depressed appearing.  CNS: CN 2-12 intact, power, tone and sensation normal throughout.        Assessment & Plan:

## 2013-05-12 NOTE — Patient Instructions (Addendum)
F/u in 6 month, call if you need me before.  Blood sugar has improved , congrats.  Try to eliminate as much as possible cheese, butter, red meat and oils, cholesterol has increased.  It is important that you exercise regularly at least 30 minutes 5 times a week. If you develop chest pain, have severe difficulty breathing, or feel very tired, stop exercising immediately and seek medical attention    A healthy diet is rich in fruit, vegetables and whole grains. Poultry fish, nuts and beans are a healthy choice for protein rather then red meat. A low sodium diet and drinking 64 ounces of water daily is generally recommended. Oils and sweet should be limited. Carbohydrates especially for those who are diabetic or overweight, should be limited to 30-45 gram per meal. It is important to eat on a regular schedule, at least 3 times daily. Snacks should be primarily fruits, vegetables or nuts.  Weight loss goal of 8 to 10  Pounds  Start calcium with D capsule 1200mg  /100IU once daily for bone health  Fasting lipid, cmp, and hBA1c in 6 month

## 2013-05-14 DIAGNOSIS — E8881 Metabolic syndrome: Secondary | ICD-10-CM

## 2013-05-14 DIAGNOSIS — E785 Hyperlipidemia, unspecified: Secondary | ICD-10-CM | POA: Insufficient documentation

## 2013-05-14 HISTORY — DX: Metabolic syndrome: E88.81

## 2013-05-14 HISTORY — DX: Metabolic syndrome: E88.810

## 2013-05-14 NOTE — Assessment & Plan Note (Signed)
Deteriorated. Opting to work on diet only, no meds at this time Hyperlipidemia:Low fat diet discussed and encouraged.

## 2013-05-14 NOTE — Assessment & Plan Note (Signed)
Improved Patient educated about the importance of limiting  Carbohydrate intake , the need to commit to daily physical activity for a minimum of 30 minutes , and to commit weight loss. The fact that changes in all these areas will reduce or eliminate all together the development of diabetes is stressed.    

## 2013-05-14 NOTE — Assessment & Plan Note (Signed)
Deteriorated. Patient re-educated about  the importance of commitment to a  minimum of 150 minutes of exercise per week. The importance of healthy food choices with portion control discussed. Encouraged to start a food diary, count calories and to consider  joining a support group. Sample diet sheets offered. Goals set by the patient for the next several months.    

## 2013-05-14 NOTE — Assessment & Plan Note (Signed)
Controlled, no change in medication DASH diet and commitment to daily physical activity for a minimum of 30 minutes discussed and encouraged, as a part of hypertension management. The importance of attaining a healthy weight is also discussed.  

## 2013-05-14 NOTE — Assessment & Plan Note (Signed)
Increased CV risk, pt needs to work on lifestyle change to reverse this

## 2013-05-16 ENCOUNTER — Other Ambulatory Visit: Payer: Self-pay

## 2013-05-16 DIAGNOSIS — I10 Essential (primary) hypertension: Secondary | ICD-10-CM

## 2013-05-16 MED ORDER — TRIAMTERENE-HCTZ 75-50 MG PO TABS: 1.0000 | ORAL_TABLET | Freq: Every day | ORAL | Status: DC

## 2013-05-27 ENCOUNTER — Other Ambulatory Visit: Payer: Self-pay | Admitting: Family Medicine

## 2013-05-27 DIAGNOSIS — Z139 Encounter for screening, unspecified: Secondary | ICD-10-CM

## 2013-06-24 ENCOUNTER — Ambulatory Visit (HOSPITAL_COMMUNITY)
Admission: RE | Admit: 2013-06-24 | Discharge: 2013-06-24 | Disposition: A | Discharge status: Home / Self Care | Payer: Commercial Managed Care - PPO | Source: Ambulatory Visit | Attending: Family Medicine | Admitting: Family Medicine

## 2013-06-24 DIAGNOSIS — Z1231 Encounter for screening mammogram for malignant neoplasm of breast: Secondary | ICD-10-CM | POA: Insufficient documentation

## 2013-06-24 DIAGNOSIS — Z139 Encounter for screening, unspecified: Secondary | ICD-10-CM

## 2013-11-15 ENCOUNTER — Telehealth: Payer: Self-pay | Admitting: Family Medicine

## 2013-11-16 ENCOUNTER — Other Ambulatory Visit: Payer: Self-pay

## 2013-11-16 DIAGNOSIS — I10 Essential (primary) hypertension: Secondary | ICD-10-CM

## 2013-11-16 MED ORDER — TRIAMTERENE-HCTZ 75-50 MG PO TABS: 1.0000 | ORAL_TABLET | Freq: Every day | ORAL | Status: DC

## 2013-11-16 NOTE — Telephone Encounter (Signed)
30 days only sent to requested pharmacy

## 2013-11-26 ENCOUNTER — Other Ambulatory Visit: Payer: Self-pay | Admitting: Family Medicine

## 2013-11-26 LAB — LIPID PANEL
HDL: 86 mg/dL (ref >39)
LDL Cholesterol: 147 mg/dL — ABNORMAL HIGH (ref 0–99)
Total CHOL/HDL Ratio: 2.8 Ratio
VLDL: 7 mg/dL (ref 0–40)

## 2013-11-26 LAB — COMPREHENSIVE METABOLIC PANEL
AST: 24 U/L (ref 0–37)
Alkaline Phosphatase: 93 U/L (ref 39–117)
BUN: 16 mg/dL (ref 6–23)
Creat: 0.71 mg/dL (ref 0.50–1.10)
Potassium: 3.3 mEq/L — ABNORMAL LOW (ref 3.5–5.3)
Total Bilirubin: 0.5 mg/dL (ref 0.3–1.2)

## 2013-11-26 LAB — HEMOGLOBIN A1C: Hgb A1c MFr Bld: 6.1 % — ABNORMAL HIGH (ref <5.7)

## 2013-12-01 ENCOUNTER — Encounter: Payer: Self-pay | Admitting: Family Medicine

## 2013-12-01 ENCOUNTER — Ambulatory Visit (INDEPENDENT_AMBULATORY_CARE_PROVIDER_SITE_OTHER): Payer: Commercial Managed Care - PPO | Admitting: Family Medicine

## 2013-12-01 ENCOUNTER — Other Ambulatory Visit (HOSPITAL_COMMUNITY)
Admission: RE | Admit: 2013-12-01 | Discharge: 2013-12-01 | Disposition: A | Discharge status: Home / Self Care | Payer: Commercial Managed Care - PPO | Source: Ambulatory Visit | Attending: Family Medicine | Admitting: Family Medicine

## 2013-12-01 ENCOUNTER — Encounter (INDEPENDENT_AMBULATORY_CARE_PROVIDER_SITE_OTHER): Payer: Self-pay

## 2013-12-01 VITALS — BP 130/80 | HR 74 | Resp 18 | Ht 62.5 in | Wt 158.1 lb

## 2013-12-01 DIAGNOSIS — R7302 Impaired glucose tolerance (oral): Secondary | ICD-10-CM

## 2013-12-01 DIAGNOSIS — E663 Overweight: Secondary | ICD-10-CM

## 2013-12-01 DIAGNOSIS — Z01419 Encounter for gynecological examination (general) (routine) without abnormal findings: Secondary | ICD-10-CM | POA: Insufficient documentation

## 2013-12-01 DIAGNOSIS — H6123 Impacted cerumen, bilateral: Secondary | ICD-10-CM

## 2013-12-01 DIAGNOSIS — Z6825 Body mass index (BMI) 25.0-25.9, adult: Secondary | ICD-10-CM

## 2013-12-01 DIAGNOSIS — Z23 Encounter for immunization: Secondary | ICD-10-CM

## 2013-12-01 DIAGNOSIS — E8881 Metabolic syndrome: Secondary | ICD-10-CM

## 2013-12-01 DIAGNOSIS — E785 Hyperlipidemia, unspecified: Secondary | ICD-10-CM

## 2013-12-01 DIAGNOSIS — Z1211 Encounter for screening for malignant neoplasm of colon: Secondary | ICD-10-CM

## 2013-12-01 DIAGNOSIS — Z0000 Encounter for general adult medical examination without abnormal findings: Secondary | ICD-10-CM

## 2013-12-01 DIAGNOSIS — I10 Essential (primary) hypertension: Secondary | ICD-10-CM

## 2013-12-01 DIAGNOSIS — H6120 Impacted cerumen, unspecified ear: Secondary | ICD-10-CM

## 2013-12-01 DIAGNOSIS — R7309 Other abnormal glucose: Secondary | ICD-10-CM

## 2013-12-01 MED ORDER — TRIAMTERENE-HCTZ 75-50 MG PO TABS: 1.0000 | ORAL_TABLET | Freq: Every day | ORAL | Status: DC

## 2013-12-01 MED ORDER — AMLODIPINE BESYLATE 2.5 MG PO TABS: ORAL_TABLET | ORAL | Status: DC

## 2013-12-01 NOTE — Assessment & Plan Note (Signed)
succesful bilateral ear irrigation by nursing

## 2013-12-01 NOTE — Progress Notes (Signed)
   Subjective:    Patient ID: Kathleen Kirby, female    DOB: June 24, 1961, 52 y.o.   MRN: [id number]  HPI The PT is here for annual exam  and re-evaluation of chronic medical conditions, medication management and review of any available recent lab and radiology data.  Preventive health is updated, specifically  Cancer screening and Immunization.    The PT denies any adverse reactions to current medications since the last visit.  There are no new concerns.  There are no specific complaints  She has not been taking the statin recommended and has been inconsistent in lifestyle change with no weight reduction, and deterioration of lipid status      Review of Systems See HPI Denies recent fever or chills. Denies sinus pressure, nasal congestion, ear pain or sore throat. Denies chest congestion, productive cough or wheezing. Denies chest pains, palpitations and leg swelling Denies abdominal pain, nausea, vomiting,diarrhea or constipation.   Denies dysuria, frequency, hesitancy or incontinence. Denies joint pain, swelling and limitation in mobility. Denies headaches, seizures, numbness, or tingling. Denies depression, anxiety or insomnia. Denies skin break down or rash.        Objective:   Physical Exam Pleasant well nourished female, alert and oriented x 3, in no cardio-pulmonary distress. Afebrile. HEENT No facial trauma or asymetry. Sinuses non tender.  EOMI, PERTL, fundoscopic exam is normal, no hemorhage or exudate.  External ears normal, tympanic membranes clear following bilateral ear irrigation, previously both were impacted with cerumen Oropharynx moist, no exudate,fairly  good dentition. Neck: supple, no adenopathy,JVD or thyromegaly.No bruits.  Chest: Clear to ascultation bilaterally.No crackles or wheezes. Non tender to palpation  Breast: No asymetry,no masses. No nipple discharge or inversion. No axillary or supraclavicular adenopathy  Cardiovascular  system; Heart sounds normal,  S1 and  S2 ,no S3.  No murmur, or thrill. Apical beat not displaced Peripheral pulses normal.  Abdomen: Soft, non tender, no organomegaly or masses. No bruits. Bowel sounds normal. No guarding, tenderness or rebound.  Rectal:  No mass. Guaiac negative stool.  GU: External genitalia normal. No lesions. Vaginal canal normal.No discharge. Uterus normal size, no adnexal masses, no cervical motion or adnexal tenderness.  Musculoskeletal exam: Full ROM of spine, hips , shoulders and knees. No deformity ,swelling or crepitus noted. No muscle wasting or atrophy.   Neurologic: Cranial nerves 2 to 12 intact. Power, tone ,sensation and reflexes normal throughout. No disturbance in gait. No tremor.  Skin: Intact, no ulceration, erythema , scaling or rash noted. Pigmentation normal throughout  Psych; Normal mood and affect. Judgement and concentration normal        Assessment & Plan:

## 2013-12-01 NOTE — Assessment & Plan Note (Signed)
Deteriorated, non compliant with medication , rept in 4 month, pt encouraged to use statin but no interest at this time

## 2013-12-01 NOTE — Assessment & Plan Note (Signed)
Deteriorated Patient educated about the importance of limiting  Carbohydrate intake , the need to commit to daily physical activity for a minimum of 30 minutes , and to commit weight loss. The fact that changes in all these areas will reduce or eliminate all together the development of diabetes is stressed.    

## 2013-12-01 NOTE — Patient Instructions (Signed)
F/u in 4 month, call if you need me before  Please change diet and exercise as discussed so that your cholesterol and blood sugar improve, this is vERY important  Ears will be flushed today  Flu vaccine today  Fasting lipid, chem 7 and hBa1C in 4 month, before next visit.  Please take the potassium daily as prescribed, this is very important

## 2013-12-01 NOTE — Assessment & Plan Note (Signed)
Controlled, no change in medication DASH diet and commitment to daily physical activity for a minimum of 30 minutes discussed and encouraged, as a part of hypertension management. The importance of attaining a healthy weight is also discussed.  

## 2013-12-01 NOTE — Assessment & Plan Note (Addendum)
Unchnaged. Patient re-educated about  the importance of commitment to a  minimum of 150 minutes of exercise per week. The importance of healthy food choices with portion control discussed. Encouraged to start a food diary, count calories and to consider  joining a support group. Sample diet sheets offered. Goals set by the patient for the next several months.    

## 2013-12-01 NOTE — Assessment & Plan Note (Signed)
Deterioration as far as lipid and blood sugar status, pt aware of her increased CV risk and wants to use lifestyle change only to address this

## 2013-12-01 NOTE — Assessment & Plan Note (Signed)
Physical exam as documented. No abnormality on physical exam Weight , hyperlipidemia and IGT with positive f/h of premature CAD in her sister makes behavioral  Change, med compliance and weight loss priorities

## 2013-12-06 ENCOUNTER — Encounter: Payer: Commercial Managed Care - PPO | Admitting: Family Medicine

## 2013-12-12 ENCOUNTER — Other Ambulatory Visit: Payer: Self-pay | Admitting: Family Medicine

## 2013-12-12 DIAGNOSIS — R87619 Unspecified abnormal cytological findings in specimens from cervix uteri: Secondary | ICD-10-CM

## 2013-12-21 ENCOUNTER — Ambulatory Visit (INDEPENDENT_AMBULATORY_CARE_PROVIDER_SITE_OTHER): Payer: Commercial Managed Care - PPO | Admitting: Obstetrics & Gynecology

## 2013-12-21 ENCOUNTER — Encounter: Payer: Self-pay | Admitting: Obstetrics & Gynecology

## 2013-12-21 VITALS — BP 140/80 | Ht 60.0 in | Wt 162.0 lb

## 2013-12-21 DIAGNOSIS — R87810 Cervical high risk human papillomavirus (HPV) DNA test positive: Secondary | ICD-10-CM

## 2013-12-21 DIAGNOSIS — N870 Mild cervical dysplasia: Secondary | ICD-10-CM

## 2013-12-21 HISTORY — DX: Mild cervical dysplasia: N87.0

## 2013-12-21 NOTE — Progress Notes (Signed)
Patient ID: Kathleen Kirby, female   DOB: [birth year], 53 y.o.   MRN: [id number] 53 y.o. G0P) No LMP recorded. Patient is not currently having periods (Reason: Perimenopausal). With LSIL on Pap smear performed in 11/2013.  Previous Pap 11/2012 was normal  Here for colposcopic evaluation  Colposcopy 3% acetic acid used  No punctation No mosaicism No abnormal vessels seen  Normal colposcopy  Recommend repeat Pap smear per routine in 11/2014

## 2014-02-06 ENCOUNTER — Other Ambulatory Visit: Payer: Self-pay

## 2014-02-06 DIAGNOSIS — I10 Essential (primary) hypertension: Secondary | ICD-10-CM

## 2014-02-06 MED ORDER — TRIAMTERENE-HCTZ 75-50 MG PO TABS: 1.0000 | ORAL_TABLET | Freq: Every day | ORAL | Status: DC

## 2014-03-16 ENCOUNTER — Ambulatory Visit: Payer: Commercial Managed Care - PPO | Admitting: Family Medicine

## 2014-05-03 ENCOUNTER — Other Ambulatory Visit: Payer: Self-pay

## 2014-05-03 MED ORDER — IBUPROFEN 800 MG PO TABS: 800.0000 mg | ORAL_TABLET | Freq: Three times a day (TID) | ORAL | Status: DC | PRN

## 2014-05-03 MED ORDER — AMLODIPINE BESYLATE 2.5 MG PO TABS: ORAL_TABLET | ORAL | Status: DC

## 2014-05-06 LAB — LIPID PANEL
Cholesterol: 215 mg/dL — ABNORMAL HIGH (ref 0–200)
HDL: 74 mg/dL (ref >39)
LDL Cholesterol: 132 mg/dL — ABNORMAL HIGH (ref 0–99)
Total CHOL/HDL Ratio: 2.9 Ratio
Triglycerides: 43 mg/dL (ref <150)
VLDL: 9 mg/dL (ref 0–40)

## 2014-05-06 LAB — BASIC METABOLIC PANEL
BUN: 19 mg/dL (ref 6–23)
CO2: 30 mEq/L (ref 19–32)
Calcium: 9.8 mg/dL (ref 8.4–10.5)
Chloride: 99 mEq/L (ref 96–112)
Creat: 0.72 mg/dL (ref 0.50–1.10)
Glucose, Bld: 88 mg/dL (ref 70–99)
Potassium: 3.5 mEq/L (ref 3.5–5.3)
Sodium: 135 mEq/L (ref 135–145)

## 2014-05-06 LAB — HEMOGLOBIN A1C
Hgb A1c MFr Bld: 6.1 % — ABNORMAL HIGH (ref <5.7)
Mean Plasma Glucose: 128 mg/dL — ABNORMAL HIGH (ref <117)

## 2014-05-11 ENCOUNTER — Encounter (INDEPENDENT_AMBULATORY_CARE_PROVIDER_SITE_OTHER): Payer: Self-pay

## 2014-05-11 ENCOUNTER — Ambulatory Visit (INDEPENDENT_AMBULATORY_CARE_PROVIDER_SITE_OTHER): Payer: Commercial Managed Care - PPO | Admitting: Family Medicine

## 2014-05-11 ENCOUNTER — Encounter: Payer: Self-pay | Admitting: Family Medicine

## 2014-05-11 VITALS — BP 150/82 | HR 67 | Resp 16 | Wt 166.1 lb

## 2014-05-11 DIAGNOSIS — E8881 Metabolic syndrome: Secondary | ICD-10-CM

## 2014-05-11 DIAGNOSIS — R7301 Impaired fasting glucose: Secondary | ICD-10-CM

## 2014-05-11 DIAGNOSIS — I10 Essential (primary) hypertension: Secondary | ICD-10-CM

## 2014-05-11 DIAGNOSIS — R7309 Other abnormal glucose: Secondary | ICD-10-CM

## 2014-05-11 DIAGNOSIS — E669 Obesity, unspecified: Secondary | ICD-10-CM

## 2014-05-11 DIAGNOSIS — R7302 Impaired glucose tolerance (oral): Secondary | ICD-10-CM

## 2014-05-11 DIAGNOSIS — E785 Hyperlipidemia, unspecified: Secondary | ICD-10-CM

## 2014-05-11 MED ORDER — ATORVASTATIN CALCIUM 10 MG PO TABS: 10.0000 mg | ORAL_TABLET | Freq: Every day | ORAL | Status: DC

## 2014-05-11 MED ORDER — AMLODIPINE BESYLATE 5 MG PO TABS: 5.0000 mg | ORAL_TABLET | Freq: Every day | ORAL | Status: DC

## 2014-05-11 NOTE — Patient Instructions (Signed)
F/u in 3.5 month, call if you need  Me before  New is lipitor 10 mg at bedtime for cholesterol and to help to lower heart disease and stroke risk, call and stop if you have muscle aches  Increase in amlodipine dose to 5 mg daily  Fasting lipid, cmp and EGFr and hBA1C in 3.5 month

## 2014-05-12 ENCOUNTER — Other Ambulatory Visit: Payer: Self-pay

## 2014-05-12 DIAGNOSIS — I10 Essential (primary) hypertension: Secondary | ICD-10-CM

## 2014-05-12 MED ORDER — TRIAMTERENE-HCTZ 75-50 MG PO TABS: 1.0000 | ORAL_TABLET | Freq: Every day | ORAL | Status: DC

## 2014-05-13 NOTE — Assessment & Plan Note (Signed)
The increased risk of cardiovascular disease associated with this diagnosis, and the need to consistently work on lifestyle to change this is discussed. Following  a  heart healthy diet ,commitment to 30 minutes of exercise at least 5 days per week, as well as control of blood sugar and cholesterol , and achieving a healthy weight are all the areas to be addressed .  

## 2014-05-13 NOTE — Assessment & Plan Note (Signed)
Deteriorated. Patient re-educated about  the importance of commitment to a  minimum of 150 minutes of exercise per week. The importance of healthy food choices with portion control discussed. Encouraged to start a food diary, count calories and to consider  joining a support group. Sample diet sheets offered. Goals set by the patient for the next several months.    

## 2014-05-13 NOTE — Assessment & Plan Note (Signed)
Uncontrolled, inc dose of amlodipine DASH diet and commitment to daily physical activity for a minimum of 30 minutes discussed and encouraged, as a part of hypertension management. The importance of attaining a healthy weight is also discussed.

## 2014-05-13 NOTE — Assessment & Plan Note (Signed)
Improved but still too high Pt encouraged strongly to start a statin based on high incidence of CVD in her family, she will consider this Hyperlipidemia:Low fat diet discussed and encouraged.  Updated lab needed at/ before next visit.

## 2014-05-13 NOTE — Progress Notes (Signed)
Subjective:    Patient ID: Kathleen Kirby, female    DOB: 1961/10/04, 53 y.o.   MRN: [id number]  HPI The PT is here for follow up and re-evaluation of chronic medical conditions, medication management and review of any available recent lab and radiology data.  Preventive health is updated, specifically  Cancer screening and Immunization.   Questions or concerns regarding consultations or procedures which the PT has had in the interim are  addressed. The PT denies any adverse reactions to current medications since the last visit.  There are no new concerns.  There are no specific complaints       Review of Systems See HPI Denies recent fever or chills. Denies sinus pressure, nasal congestion, ear pain or sore throat. Denies chest congestion, productive cough or wheezing. Denies chest pains, palpitations and leg swelling Denies abdominal pain, nausea, vomiting,diarrhea or constipation.   Denies dysuria, frequency, hesitancy or incontinence. Denies joint pain, swelling and limitation in mobility. Denies headaches, seizures, numbness, or tingling. Denies depression, anxiety or insomnia. Denies skin break down or rash.        Objective:   Physical Exam  BP 150/82  Pulse 67  Resp 16  Wt 166 lb 1.9 oz (75.352 kg)  SpO2 100% Patient alert and oriented and in no cardiopulmonary distress.  HEENT: No facial asymmetry, EOMI, no sinus tenderness,  oropharynx pink and moist.  Neck supple no adenopathy.  Chest: Clear to auscultation bilaterally.  CVS: S1, S2 no murmurs, no S3.  ABD: Soft non tender. Bowel sounds normal.  Ext: No edema  MS: Adequate ROM spine, shoulders, hips and knees.  Skin: Intact, no ulcerations or rash noted.  Psych: Good eye contact, normal affect. Memory intact not anxious or depressed appearing.  CNS: CN 2-12 intact, power, tone and sensation normal throughout.       Assessment & Plan:  HYPERTENSION Uncontrolled, inc dose of  amlodipine DASH diet and commitment to daily physical activity for a minimum of 30 minutes discussed and encouraged, as a part of hypertension management. The importance of attaining a healthy weight is also discussed.   IGT (impaired glucose tolerance) Unchanged Patient educated about the importance of limiting  Carbohydrate intake , the need to commit to daily physical activity for a minimum of 30 minutes , and to commit weight loss. The fact that changes in all these areas will reduce or eliminate all together the development of diabetes is stressed.     Metabolic syndrome X The increased risk of cardiovascular disease associated with this diagnosis, and the need to consistently work on lifestyle to change this is discussed. Following  a  heart healthy diet ,commitment to 30 minutes of exercise at least 5 days per week, as well as control of blood sugar and cholesterol , and achieving a healthy weight are all the areas to be addressed .   Other and unspecified hyperlipidemia Improved but still too high Pt encouraged strongly to start a statin based on high incidence of CVD in her family, she will consider this Hyperlipidemia:Low fat diet discussed and encouraged.  Updated lab needed at/ before next visit.   Obesity (BMI 30.0-34.9) Deteriorated. Patient re-educated about  the importance of commitment to a  minimum of 150 minutes of exercise per week. The importance of healthy food choices with portion control discussed. Encouraged to start a food diary, count calories and to consider  joining a support group. Sample diet sheets offered. Goals set by the patient for the  next several months.

## 2014-05-13 NOTE — Assessment & Plan Note (Signed)
Unchanged Patient educated about the importance of limiting  Carbohydrate intake , the need to commit to daily physical activity for a minimum of 30 minutes , and to commit weight loss. The fact that changes in all these areas will reduce or eliminate all together the development of diabetes is stressed.    

## 2014-07-07 ENCOUNTER — Other Ambulatory Visit: Payer: Self-pay | Admitting: Family Medicine

## 2014-07-07 DIAGNOSIS — Z1231 Encounter for screening mammogram for malignant neoplasm of breast: Secondary | ICD-10-CM

## 2014-07-21 ENCOUNTER — Ambulatory Visit (HOSPITAL_COMMUNITY)
Admission: RE | Admit: 2014-07-21 | Discharge: 2014-07-21 | Disposition: A | Discharge status: Home / Self Care | Payer: Commercial Managed Care - PPO | Source: Ambulatory Visit | Attending: Family Medicine | Admitting: Family Medicine

## 2014-07-21 DIAGNOSIS — Z1231 Encounter for screening mammogram for malignant neoplasm of breast: Secondary | ICD-10-CM | POA: Insufficient documentation

## 2014-08-11 ENCOUNTER — Ambulatory Visit: Payer: Commercial Managed Care - PPO | Admitting: Family Medicine

## 2014-09-09 LAB — LIPID PANEL
CHOLESTEROL: 180 mg/dL (ref 0–200)
HDL: 68 mg/dL (ref >39)
LDL Cholesterol: 104 mg/dL — ABNORMAL HIGH (ref 0–99)
TRIGLYCERIDES: 39 mg/dL (ref <150)
Total CHOL/HDL Ratio: 2.6 Ratio
VLDL: 8 mg/dL (ref 0–40)

## 2014-09-09 LAB — COMPLETE METABOLIC PANEL WITH GFR
ALT: 12 U/L (ref 0–35)
AST: 19 U/L (ref 0–37)
Albumin: 3.7 g/dL (ref 3.5–5.2)
Alkaline Phosphatase: 75 U/L (ref 39–117)
BUN: 14 mg/dL (ref 6–23)
CALCIUM: 9.7 mg/dL (ref 8.4–10.5)
CO2: 28 meq/L (ref 19–32)
CREATININE: 0.7 mg/dL (ref 0.50–1.10)
Chloride: 102 mEq/L (ref 96–112)
GFR, Est African American: >89 mL/min
Glucose, Bld: 105 mg/dL — ABNORMAL HIGH (ref 70–99)
Potassium: 3.7 mEq/L (ref 3.5–5.3)
Sodium: 138 mEq/L (ref 135–145)
Total Bilirubin: 0.4 mg/dL (ref 0.2–1.2)
Total Protein: 6.6 g/dL (ref 6.0–8.3)

## 2014-09-10 LAB — HEMOGLOBIN A1C
Hgb A1c MFr Bld: 6.5 % — ABNORMAL HIGH (ref <5.7)
MEAN PLASMA GLUCOSE: 140 mg/dL — AB (ref <117)

## 2014-09-14 ENCOUNTER — Encounter (INDEPENDENT_AMBULATORY_CARE_PROVIDER_SITE_OTHER): Payer: Self-pay

## 2014-09-14 ENCOUNTER — Encounter: Payer: Self-pay | Admitting: Family Medicine

## 2014-09-14 ENCOUNTER — Ambulatory Visit (INDEPENDENT_AMBULATORY_CARE_PROVIDER_SITE_OTHER): Payer: Commercial Managed Care - PPO | Admitting: Family Medicine

## 2014-09-14 VITALS — BP 128/80 | HR 77 | Resp 16 | Ht 62.5 in | Wt 160.0 lb

## 2014-09-14 DIAGNOSIS — E785 Hyperlipidemia, unspecified: Secondary | ICD-10-CM

## 2014-09-14 DIAGNOSIS — E8881 Metabolic syndrome: Secondary | ICD-10-CM

## 2014-09-14 DIAGNOSIS — I10 Essential (primary) hypertension: Secondary | ICD-10-CM

## 2014-09-14 DIAGNOSIS — Z23 Encounter for immunization: Secondary | ICD-10-CM

## 2014-09-14 DIAGNOSIS — Z8249 Family history of ischemic heart disease and other diseases of the circulatory system: Secondary | ICD-10-CM | POA: Insufficient documentation

## 2014-09-14 DIAGNOSIS — E669 Obesity, unspecified: Secondary | ICD-10-CM

## 2014-09-14 DIAGNOSIS — R7302 Impaired glucose tolerance (oral): Secondary | ICD-10-CM

## 2014-09-14 HISTORY — DX: Family history of ischemic heart disease and other diseases of the circulatory system: Z82.49

## 2014-09-14 NOTE — Patient Instructions (Signed)
Pelvic and Pap 12/13/2014  HBA1C , cmp and EGFr and fasting lipid  12/26 or after  bP is good   Reduce carbs and sweets, and fatty foods, blood sugar has worsened  It is important that you exercise regularly at least 30 minutes 5 times a week. If you develop chest pain, have severe difficulty breathing, or feel very tired, stop exercising immediately and seek medical attention    Flu vaccine today 

## 2014-09-14 NOTE — Assessment & Plan Note (Signed)
Improved, though not at goal No chgange in dose of medication Hyperlipidemia:Low fat diet discussed and encouraged.  Updated lab needed at/ before next visit.

## 2014-09-14 NOTE — Assessment & Plan Note (Signed)
The increased risk of cardiovascular disease associated with this diagnosis, and the need to consistently work on lifestyle to change this is discussed. Following  a  heart healthy diet ,commitment to 30 minutes of exercise at least 5 days per week, as well as control of blood sugar and cholesterol , and achieving a healthy weight are all the areas to be addressed .  

## 2014-09-14 NOTE — Assessment & Plan Note (Signed)
Vaccine administered at visit.  

## 2014-09-14 NOTE — Assessment & Plan Note (Signed)
Improved. Pt applauded on succesful weight loss through lifestyle change, and encouraged to continue same. Weight loss goal set for the next several months.  

## 2014-09-14 NOTE — Assessment & Plan Note (Signed)
Deteriorated,pt still resistant to metformin use, though I believe it would benefit her greatly Patient educated about the importance of limiting  Carbohydrate intake , the need to commit to daily physical activity for a minimum of 30 minutes , and to commit weight loss. The fact that changes in all these areas will reduce or eliminate all together the development of diabetes is stressed.   Updated lab needed at/ before next visit.

## 2014-09-14 NOTE — Progress Notes (Signed)
   Subjective:    Patient ID: Kathleen Kirby, female    DOB: [birth year], 53 y.o.   MRN: [id number]  HPI The PT is here for follow up and re-evaluation of chronic medical conditions, medication management and review of any available recent lab and radiology data.  Preventive health is updated, specifically  Cancer screening and Immunization.Ppap due in December    The PT denies any adverse reactions to current medications since the last visit.  There are no new concerns.  There are no specific complaints       Review of Systems See HPI Denies recent fever or chills. Denies sinus pressure, nasal congestion, ear pain or sore throat. Denies chest congestion, productive cough or wheezing. Denies chest pains, palpitations and leg swelling Denies abdominal pain, nausea, vomiting,diarrhea or constipation.   Denies dysuria, frequency, hesitancy or incontinence. Denies joint pain, swelling and limitation in mobility. Denies headaches, seizures, numbness, or tingling. Denies depression, anxiety or insomnia. Denies skin break down or rash.        Objective:   Physical Exam  BP 128/80  Pulse 77  Resp 16  Ht 5' 2.5" (1.588 m)  Wt 160 lb (72.576 kg)  BMI 28.78 kg/m2  SpO2 99% Patient alert and oriented and in no cardiopulmonary distress.  HEENT: No facial asymmetry, EOMI,   oropharynx pink and moist.  Neck supple no JVD, no mass.  Chest: Clear to auscultation bilaterally.  CVS: S1, S2 no murmurs, no S3.Regular rate.  ABD: Soft non tender.   Ext: No edema  MS: Adequate ROM spine, shoulders, hips and knees.  Skin: Intact, no ulcerations or rash noted.  Psych: Good eye contact, normal affect. Memory intact not anxious or depressed appearing.  CNS: CN 2-12 intact, power,  normal throughout.no focal deficits noted.       Assessment & Plan:  Essential hypertension Controlled, no change in medication DASH diet and commitment to daily physical activity for a minimum of  30 minutes discussed and encouraged, as a part of hypertension management. The importance of attaining a healthy weight is also discussed.   Need for prophylactic vaccination and inoculation against influenza Vaccine administered at visit.   IGT (impaired glucose tolerance) Deteriorated,pt still resistant to metformin use, though I believe it would benefit her greatly Patient educated about the importance of limiting  Carbohydrate intake , the need to commit to daily physical activity for a minimum of 30 minutes , and to commit weight loss. The fact that changes in all these areas will reduce or eliminate all together the development of diabetes is stressed.   Updated lab needed at/ before next visit.    Obesity (BMI 30.0-34.9) Improved. Pt applauded on succesful weight loss through lifestyle change, and encouraged to continue same. Weight loss goal set for the next several months.   Hyperlipidemia LDL goal <100 Improved, though not at goal No chgange in dose of medication Hyperlipidemia:Low fat diet discussed and encouraged.  Updated lab needed at/ before next visit.   Metabolic syndrome X The increased risk of cardiovascular disease associated with this diagnosis, and the need to consistently work on lifestyle to change this is discussed. Following  a  heart healthy diet ,commitment to 30 minutes of exercise at least 5 days per week, as well as control of blood sugar and cholesterol , and achieving a healthy weight are all the areas to be addressed .

## 2014-09-14 NOTE — Assessment & Plan Note (Signed)
Controlled, no change in medication DASH diet and commitment to daily physical activity for a minimum of 30 minutes discussed and encouraged, as a part of hypertension management. The importance of attaining a healthy weight is also discussed.  

## 2014-09-15 ENCOUNTER — Other Ambulatory Visit: Payer: Self-pay | Admitting: Family Medicine

## 2014-09-29 ENCOUNTER — Other Ambulatory Visit: Payer: Self-pay

## 2014-09-29 MED ORDER — IBUPROFEN 800 MG PO TABS: 800.0000 mg | ORAL_TABLET | Freq: Three times a day (TID) | ORAL | Status: DC | PRN

## 2014-11-22 ENCOUNTER — Telehealth: Payer: Self-pay | Admitting: Family Medicine

## 2014-11-22 ENCOUNTER — Ambulatory Visit (INDEPENDENT_AMBULATORY_CARE_PROVIDER_SITE_OTHER): Payer: Commercial Managed Care - PPO | Admitting: Family Medicine

## 2014-11-22 ENCOUNTER — Encounter: Payer: Self-pay | Admitting: Family Medicine

## 2014-11-22 ENCOUNTER — Encounter (INDEPENDENT_AMBULATORY_CARE_PROVIDER_SITE_OTHER): Payer: Self-pay

## 2014-11-22 VITALS — BP 130/70 | HR 84 | Resp 18 | Ht 63.0 in | Wt 162.0 lb

## 2014-11-22 DIAGNOSIS — R7302 Impaired glucose tolerance (oral): Secondary | ICD-10-CM

## 2014-11-22 DIAGNOSIS — E669 Obesity, unspecified: Secondary | ICD-10-CM

## 2014-11-22 DIAGNOSIS — I10 Essential (primary) hypertension: Secondary | ICD-10-CM

## 2014-11-22 DIAGNOSIS — Z1211 Encounter for screening for malignant neoplasm of colon: Secondary | ICD-10-CM | POA: Insufficient documentation

## 2014-11-22 DIAGNOSIS — E785 Hyperlipidemia, unspecified: Secondary | ICD-10-CM

## 2014-11-22 DIAGNOSIS — Z0000 Encounter for general adult medical examination without abnormal findings: Secondary | ICD-10-CM

## 2014-11-22 LAB — POC HEMOCCULT BLD/STL (OFFICE/1-CARD/DIAGNOSTIC): Fecal Occult Blood, POC: NEGATIVE

## 2014-11-22 NOTE — Patient Instructions (Addendum)
F/U as before,for regular visit   Rectal exam today for colon screen is normal;l  Blood pressure is excellent  Congrats on weight loss.  Check with your ins for coverage of the"pneumonia vaccine , Prevnar, so you get at next visit  Fasting lipid, cmp and EGFr, hBA1C, cBC and tSH for follow up visit

## 2014-11-22 NOTE — Assessment & Plan Note (Signed)
Hyperlipidemia:Low fat diet discussed and encouraged.  Updated lab needed at/ before next visit. Improved on medciation

## 2014-11-22 NOTE — Assessment & Plan Note (Addendum)
Heme negative stool, no mass on rectal  exam

## 2014-11-22 NOTE — Assessment & Plan Note (Signed)
Improved. Pt applauded on succesful weight loss through lifestyle change, and encouraged to continue same. Weight loss goal set for the next several months.  

## 2014-11-22 NOTE — Telephone Encounter (Signed)
Pt had abnormal pa  12/01/2013, she was seen by gyne, colpo was nl, rept pap in 11/2014 "per routine" was gyne recommendation. Pls arrange for her to come in for pelvic  pap only 12/19 or after, but by 12/14/2014, explain situation to pt pls  ???pls ask

## 2014-11-22 NOTE — Progress Notes (Signed)
   Subjective:    Patient ID: Kathleen Kirby, female    DOB: Jun 23, 1961, 53 y.o.   MRN: [id number]  HPI Patient is in for annual exam, required by her work, specifically tracking vital signs, lipids,  Blood sugar No other health concerns are expressed or addressed at the visit.    Review of Systems See HPI     Objective:   Physical Exam BP 130/70 mmHg  Pulse 84  Resp 18  Ht 5\' 3"  (1.6 m)  Wt 162 lb 0.6 oz (73.501 kg)  BMI 28.71 kg/m2  SpO2 98%  Pleasant well nourished female, alert and oriented x 3, in no cardio-pulmonary distress. Afebrile. HEENT No facial trauma or asymetry. Sinuses non tender.  Extra occullar muscles intact, pupils equally reactive to light. External ears normal, tympanic membranes clear. Oropharynx moist, no exudate, good dentition. Neck: supple, no adenopathy,JVD or thyromegaly.No bruits.  Chest: Clear to ascultation bilaterally.No crackles or wheezes. Non tender to palpation    Cardiovascular system; Heart sounds normal,  S1 and  S2 ,no S3.  No murmur, or thrill. Apical beat not displaced Peripheral pulses normal.  Abdomen:Waist 32 inches Soft, non tender, no organomegaly or masses. No bruits. Bowel sounds normal. No guarding, tenderness or rebound.  Rectal:  Normal sphincter tone. No mass.No rectal masses.  Guaiac negative stool.  GU: Not examined  Musculoskeletal exam: Full ROM of spine, hips , shoulders and knees. No deformity ,swelling or crepitus noted. No muscle wasting or atrophy.   Neurologic: Cranial nerves 2 to 12 intact. Power, tone ,sensation and reflexes normal throughout. No disturbance in gait. No tremor.  Skin: Intact, no ulceration, erythema , scaling or rash noted. Pigmentation normal throughout  Psych; Normal mood and affect. Judgement and concentration normal        Assessment & Plan:  Encounter for annual health examination Annual exam as documented. Counseling done  re healthy lifestyle  involving commitment to 150 minutes exercise per week, heart healthy diet, and attaining healthy weight.The importance of adequate sleep also discussed.  Immunization and cancer screening needs are specifically addressed at this visit.   Special screening for malignant neoplasms, colon Heme negative stool, no mass on rectal  exam  Obesity (BMI 30.0-34.9) Improved. Pt applauded on succesful weight loss through lifestyle change, and encouraged to continue same. Weight loss goal set for the next several months.   Hyperlipidemia LDL goal <100 Hyperlipidemia:Low fat diet discussed and encouraged.  Updated lab needed at/ before next visit. Improved on medciation  IGT (impaired glucose tolerance) Patient educated about the importance of limiting  Carbohydrate intake , the need to commit to daily physical activity for a minimum of 30 minutes , and to commit weight loss. The fact that changes in all these areas will reduce or eliminate all together the development of diabetes is stressed.   Updated lab needed at/ before next visit.

## 2014-11-22 NOTE — Assessment & Plan Note (Signed)
Annual exam as documented. Counseling done  re healthy lifestyle involving commitment to 150 minutes exercise per week, heart healthy diet, and attaining healthy weight.The importance of adequate sleep also discussed.  Immunization and cancer screening needs are specifically addressed at this visit.  

## 2014-11-22 NOTE — Assessment & Plan Note (Signed)
Patient educated about the importance of limiting  Carbohydrate intake , the need to commit to daily physical activity for a minimum of 30 minutes , and to commit weight loss. The fact that changes in all these areas will reduce or eliminate all together the development of diabetes is stressed.   Updated lab needed at/ before next visit.  

## 2014-11-23 ENCOUNTER — Other Ambulatory Visit: Payer: Self-pay

## 2014-11-23 DIAGNOSIS — E785 Hyperlipidemia, unspecified: Secondary | ICD-10-CM

## 2014-11-23 MED ORDER — AMLODIPINE BESYLATE 5 MG PO TABS: ORAL_TABLET | ORAL | Status: DC

## 2014-11-23 MED ORDER — ATORVASTATIN CALCIUM 10 MG PO TABS: 10.0000 mg | ORAL_TABLET | Freq: Every day | ORAL | Status: DC

## 2014-11-23 MED ORDER — TRIAMTERENE-HCTZ 75-50 MG PO TABS: ORAL_TABLET | ORAL | Status: DC

## 2014-11-28 NOTE — Telephone Encounter (Signed)
Called and left message for patient to return call.  

## 2014-12-04 ENCOUNTER — Telehealth: Payer: Self-pay | Admitting: Family Medicine

## 2014-12-04 NOTE — Telephone Encounter (Signed)
Noted will return call

## 2014-12-05 NOTE — Telephone Encounter (Signed)
Called and left message for patient to return call.  

## 2014-12-06 NOTE — Telephone Encounter (Signed)
Patient aware and will come in on 12/31 @ 8

## 2014-12-11 ENCOUNTER — Encounter: Payer: Commercial Managed Care - PPO | Admitting: Family Medicine

## 2014-12-14 ENCOUNTER — Ambulatory Visit (INDEPENDENT_AMBULATORY_CARE_PROVIDER_SITE_OTHER): Payer: Commercial Managed Care - PPO | Admitting: Family Medicine

## 2014-12-14 ENCOUNTER — Other Ambulatory Visit (HOSPITAL_COMMUNITY)
Admission: RE | Admit: 2014-12-14 | Discharge: 2014-12-14 | Disposition: A | Discharge status: Home / Self Care | Payer: Commercial Managed Care - PPO | Source: Ambulatory Visit | Attending: Family Medicine | Admitting: Family Medicine

## 2014-12-14 VITALS — BP 122/80 | HR 72 | Resp 14 | Wt 167.0 lb

## 2014-12-14 DIAGNOSIS — Z01411 Encounter for gynecological examination (general) (routine) with abnormal findings: Secondary | ICD-10-CM | POA: Insufficient documentation

## 2014-12-14 DIAGNOSIS — Z124 Encounter for screening for malignant neoplasm of cervix: Secondary | ICD-10-CM

## 2014-12-14 DIAGNOSIS — Z01419 Encounter for gynecological examination (general) (routine) without abnormal findings: Secondary | ICD-10-CM

## 2014-12-14 DIAGNOSIS — Z1272 Encounter for screening for malignant neoplasm of vagina: Secondary | ICD-10-CM

## 2014-12-15 ENCOUNTER — Encounter: Payer: Self-pay | Admitting: Family Medicine

## 2014-12-15 DIAGNOSIS — Z01419 Encounter for gynecological examination (general) (routine) without abnormal findings: Secondary | ICD-10-CM | POA: Insufficient documentation

## 2014-12-15 NOTE — Progress Notes (Signed)
   Subjective:    Patient ID: Kathleen Kirby, female    DOB: 1961/06/21, 54 y.o.   MRN: [id number]  HPI Pt in for pelvic and pap, she had abnormal result last year and was evaluate by gyne, this was the first time that she had an abnormal pap smear  Review of Systems See HPI      Objective:   Physical Exam BP 122/80 mmHg  Pulse 72  Resp 14  Wt 167 lb (75.751 kg)  SpO2 99%  Pleasant well nourished female, alert and oriented x 3, in no cardio-pulmonary distress. Afebrile.      GU: External genitalia normal female genitalia , female distribution of hair. No lesions. Urethral meatus normal in size, no  Prolapse, no lesions visibly  Present. Bladder non tender. Vagina pink and moist , with no visible lesions , physiologic discharge present . Adequate pelvic support no  cystocele or rectocele noted Cervix pink and appears healthy, no lesions or ulcerations noted, no discharge noted from os Uterus normal size, no adnexal masses, no cervical motion or adnexal tenderness.          Assessment & Plan:  Mild dysplasia of cervix Pelvic exam and pap done at this visit, will follow up results  Encounter for routine gynecological examination Pelvic and pap done at this visit. Pt has had abnormal pap in the past. Will follow result closely

## 2014-12-15 NOTE — Assessment & Plan Note (Signed)
Pelvic and pap done at this visit. Pt has had abnormal pap in the past. Will follow result closely

## 2014-12-15 NOTE — Assessment & Plan Note (Signed)
Pelvic exam and pap done at this visit, will follow up results

## 2014-12-18 LAB — CYTOLOGY - PAP

## 2014-12-28 ENCOUNTER — Encounter: Payer: Commercial Managed Care - PPO | Admitting: Family Medicine

## 2015-03-15 ENCOUNTER — Ambulatory Visit: Payer: Commercial Managed Care - PPO | Admitting: Family Medicine

## 2015-05-11 LAB — CBC WITH DIFFERENTIAL/PLATELET
BASOS PCT: 1 % (ref 0–1)
Basophils Absolute: 0.1 10*3/uL (ref 0.0–0.1)
EOS PCT: 2 % (ref 0–5)
Eosinophils Absolute: 0.1 10*3/uL (ref 0.0–0.7)
HCT: 38.8 % (ref 36.0–46.0)
HEMOGLOBIN: 12.8 g/dL (ref 12.0–15.0)
LYMPHS ABS: 2.1 10*3/uL (ref 0.7–4.0)
Lymphocytes Relative: 41 % (ref 12–46)
MCH: 25.4 pg — ABNORMAL LOW (ref 26.0–34.0)
MCHC: 33 g/dL (ref 30.0–36.0)
MCV: 77.1 fL — AB (ref 78.0–100.0)
MONO ABS: 0.4 10*3/uL (ref 0.1–1.0)
MONOS PCT: 7 % (ref 3–12)
MPV: 10.9 fL (ref 9.4–12.4)
NEUTROS PCT: 49 % (ref 43–77)
Neutro Abs: 2.5 10*3/uL (ref 1.7–7.7)
Platelets: 275 10*3/uL (ref 150–400)
RBC: 5.03 MIL/uL (ref 3.87–5.11)
RDW: 14.4 % (ref 11.5–15.5)
WBC: 5 10*3/uL (ref 4.0–10.5)

## 2015-05-11 LAB — HEMOGLOBIN A1C
Hgb A1c MFr Bld: 6.4 % — ABNORMAL HIGH (ref <5.7)
Mean Plasma Glucose: 137 mg/dL — ABNORMAL HIGH (ref <117)

## 2015-05-12 LAB — COMPLETE METABOLIC PANEL WITH GFR
ALBUMIN: 3.8 g/dL (ref 3.5–5.2)
ALK PHOS: 86 U/L (ref 39–117)
ALT: 13 U/L (ref 0–35)
AST: 21 U/L (ref 0–37)
BUN: 11 mg/dL (ref 6–23)
CHLORIDE: 99 meq/L (ref 96–112)
CO2: 33 mEq/L — ABNORMAL HIGH (ref 19–32)
Calcium: 9.4 mg/dL (ref 8.4–10.5)
Creat: 0.65 mg/dL (ref 0.50–1.10)
GFR, Est Non African American: >89 mL/min
Glucose, Bld: 110 mg/dL — ABNORMAL HIGH (ref 70–99)
POTASSIUM: 4.3 meq/L (ref 3.5–5.3)
SODIUM: 138 meq/L (ref 135–145)
TOTAL PROTEIN: 6.5 g/dL (ref 6.0–8.3)
Total Bilirubin: 0.6 mg/dL (ref 0.2–1.2)

## 2015-05-12 LAB — LIPID PANEL
CHOL/HDL RATIO: 2.4 ratio
CHOLESTEROL: 212 mg/dL — AB (ref 0–200)
HDL: 87 mg/dL (ref >=46)
LDL Cholesterol: 118 mg/dL — ABNORMAL HIGH (ref 0–99)
Triglycerides: 36 mg/dL (ref <150)
VLDL: 7 mg/dL (ref 0–40)

## 2015-05-12 LAB — TSH: TSH: 0.627 u[IU]/mL (ref 0.350–4.500)

## 2015-05-15 ENCOUNTER — Ambulatory Visit (INDEPENDENT_AMBULATORY_CARE_PROVIDER_SITE_OTHER): Payer: Commercial Managed Care - PPO | Admitting: Family Medicine

## 2015-05-15 ENCOUNTER — Encounter: Payer: Self-pay | Admitting: Family Medicine

## 2015-05-15 VITALS — BP 130/80 | HR 89 | Resp 16 | Ht 63.0 in | Wt 168.1 lb

## 2015-05-15 DIAGNOSIS — E8881 Metabolic syndrome: Secondary | ICD-10-CM

## 2015-05-15 DIAGNOSIS — I10 Essential (primary) hypertension: Secondary | ICD-10-CM | POA: Diagnosis not present

## 2015-05-15 DIAGNOSIS — E785 Hyperlipidemia, unspecified: Secondary | ICD-10-CM | POA: Diagnosis not present

## 2015-05-15 DIAGNOSIS — Z8249 Family history of ischemic heart disease and other diseases of the circulatory system: Secondary | ICD-10-CM

## 2015-05-15 DIAGNOSIS — R7302 Impaired glucose tolerance (oral): Secondary | ICD-10-CM

## 2015-05-15 DIAGNOSIS — N870 Mild cervical dysplasia: Secondary | ICD-10-CM

## 2015-05-15 DIAGNOSIS — E669 Obesity, unspecified: Secondary | ICD-10-CM

## 2015-05-15 MED ORDER — AMLODIPINE BESYLATE 5 MG PO TABS: ORAL_TABLET | ORAL | Status: DC

## 2015-05-15 MED ORDER — TRIAMTERENE-HCTZ 75-50 MG PO TABS: ORAL_TABLET | ORAL | Status: DC

## 2015-05-15 MED ORDER — PRAVASTATIN SODIUM 10 MG PO TABS: 10.0000 mg | ORAL_TABLET | Freq: Every day | ORAL | Status: DC

## 2015-05-15 NOTE — Assessment & Plan Note (Signed)
Normal pap in 11/2014

## 2015-05-15 NOTE — Assessment & Plan Note (Signed)
Unchanged, pt to try pravastatin , importance of statin use based on personal and family history is stressed Hyperlipidemia:Low fat diet discussed and encouraged.   Lipid Panel  Lab Results  Component Value Date   CHOL 212* 05/11/2015   HDL 87 05/11/2015   LDLCALC 118* 05/11/2015   TRIG 36 05/11/2015   CHOLHDL 2.4 05/11/2015     `

## 2015-05-15 NOTE — Assessment & Plan Note (Signed)
Younger sibling required 4V bypass surgery in 03/2015, she had stents prior to this Pt denies chest pain or fatigue on exertion

## 2015-05-15 NOTE — Patient Instructions (Signed)
Physical exam for work in 4.5 month, call if you need me before  Fasting lipid, cmp and EGFR, HBA1C in 4.5 month  New for cholesterol is pravachol at bedtime PLEASE try to consitly take the pravastatin     Please work on good  health habits so that your health will improve. 1. Commitment to daily physical activity for 30 to 60  minutes, if you are able to do this.  2. Commitment to wise food choices. Aim for half of your  food intake to be vegetable and fruit, one quarter starchy foods, and one quarter protein. Try to eat on a regular schedule  3 meals per day, snacking between meals should be limited to vegetables or fruits or small portions of nuts. 64 ounces of water per day is generally recommended, unless you have specific health conditions, like heart failure or kidney failure where you will need to limit fluid intake.  3. Commitment to sufficient and a  good quality of physical and mental rest daily, generally between 6 to 8 hours per day.  WITH PERSISTANCE AND PERSEVERANCE, THE IMPOSSIBLE , BECOMES THE NORM!   Thanks for choosing [facility], we consider it a privelige to serve you.

## 2015-05-15 NOTE — Assessment & Plan Note (Signed)
The increased risk of cardiovascular disease associated with this diagnosis, and the need to consistently work on lifestyle to change this is discussed. Following  a  heart healthy diet ,commitment to 30 minutes of exercise at least 5 days per week, as well as control of blood sugar and cholesterol , and achieving a healthy weight are all the areas to be addressed .  

## 2015-05-15 NOTE — Assessment & Plan Note (Signed)
Controlled, no change in medication DASH diet and commitment to daily physical activity for a minimum of 30 minutes discussed and encouraged, as a part of hypertension management. The importance of attaining a healthy weight is also discussed.  BP/Weight 05/15/2015 12/14/2014 11/22/2014 09/14/2014 05/11/2014 12/21/2013 35/82/5189  Systolic BP 842 103 128 118 867 737 366  Diastolic BP 80 80 70 80 82 80 80  Wt. (Lbs) 168.12 167 162.04 160 166.12 162 158.12  BMI 29.79 29.59 28.71 28.78 32.44 31.64 28.44

## 2015-05-15 NOTE — Assessment & Plan Note (Signed)
Unchanged. Patient re-educated about  the importance of commitment to a  minimum of 150 minutes of exercise per week.  The importance of healthy food choices with portion control discussed. Encouraged to start a food diary, count calories and to consider  joining a support group. Sample diet sheets offered. Goals set by the patient for the next several months.   Weight /BMI 05/15/2015 12/14/2014 11/22/2014  WEIGHT 168 lb 1.9 oz 167 lb 162 lb 0.6 oz  HEIGHT 5\' 3"  - 5\' 3"   BMI 29.79 kg/m2 29.59 kg/m2 28.71 kg/m2    Current exercise per week 120 minutes.

## 2015-05-15 NOTE — Progress Notes (Signed)
Subjective:    Patient ID: Kathleen Kirby, female    DOB: 1961/07/02, 54 y.o.   MRN: [id number]  HPI The PT is here for follow up and re-evaluation of chronic medical conditions, medication management and review of any available recent lab and radiology data.  Preventive health is updated, specifically  Cancer screening and Immunization.    The PT states that she stopped the lipitor due to muscle  cramps and fatigue There are no new concerns except for the fact that her younger sibling needs 4vessekl byoass last month , feels as though she is not spending enoough time and energy on her own health        Review of Systems See HPI Denies recent fever or chills. Denies sinus pressure, nasal congestion, ear pain or sore throat. Denies chest congestion, productive cough or wheezing. Denies chest pains, palpitations and leg swelling Denies abdominal pain, nausea, vomiting,diarrhea or constipation.   Denies dysuria, frequency, hesitancy or incontinence. Denies joint pain, swelling and limitation in mobility. Denies headaches, seizures, numbness, or tingling. Denies depression, anxiety or insomnia. Denies skin break down or rash.        Objective:   Physical Exam BP 130/80 mmHg  Pulse 89  Resp 16  Ht 5\' 3"  (1.6 m)  Wt 168 lb 1.9 oz (76.259 kg)  BMI 29.79 kg/m2  SpO2 99% Patient alert and oriented and in no cardiopulmonary distress.  HEENT: No facial asymmetry, EOMI,   oropharynx pink and moist.  Neck supple no JVD, no mass.  Chest: Clear to auscultation bilaterally.  CVS: S1, S2 no murmurs, no S3.Regular rate.  ABD: Soft non tender.   Ext: No edema  MS: Adequate ROM spine, shoulders, hips and knees.  Skin: Intact, no ulcerations or rash noted.  Psych: Good eye contact, normal affect. Memory intact not anxious or depressed appearing.  CNS: CN 2-12 intact, power,  normal throughout.no focal deficits noted.        Assessment & Plan:  Essential  hypertension Controlled, no change in medication DASH diet and commitment to daily physical activity for a minimum of 30 minutes discussed and encouraged, as a part of hypertension management. The importance of attaining a healthy weight is also discussed.  BP/Weight 05/15/2015 12/14/2014 11/22/2014 09/14/2014 05/11/2014 12/21/2013 41/66/0630  Systolic BP 160 109 323 557 322 025 427  Diastolic BP 80 80 70 80 82 80 80  Wt. (Lbs) 168.12 167 162.04 160 166.12 162 158.12  BMI 29.79 29.59 28.71 28.78 32.44 31.64 28.44         IGT (impaired glucose tolerance) Improved Patient educated about the importance of limiting  Carbohydrate intake , the need to commit to daily physical activity for a minimum of 30 minutes , and to commit weight loss. The fact that changes in all these areas will reduce or eliminate all together the development of diabetes is stressed.   Diabetic Labs Latest Ref Rng 05/11/2015 09/09/2014 05/06/2014 11/26/2013 05/06/2013  HbA1c <5.7 % 6.4(H) 6.5(H) 6.1(H) 6.1(H) 5.8(H)  Chol 0 - 200 mg/dL 212(H) 180 215(H) 240(H) 225(H)  HDL >=46 mg/dL 87 68 74 86 74  Calc LDL 0 - 99 mg/dL 118(H) 104(H) 132(H) 147(H) 143(H)  Triglycerides <150 mg/dL 36 39 43 36 38  Creatinine 0.50 - 1.10 mg/dL 0.65 0.70 0.72 0.71 0.73   BP/Weight 05/15/2015 12/14/2014 11/22/2014 09/14/2014 05/11/2014 12/21/2013 06/07/7627  Systolic BP 315 176 160 737 106 269 485  Diastolic BP 80 80 70 80 82 80 80  Wt. (  Lbs) 168.12 167 162.04 160 166.12 162 158.12  BMI 29.79 29.59 28.71 28.78 32.44 31.64 28.44   No flowsheet data found.      Obesity (BMI 30.0-34.9) Unchanged. Patient re-educated about  the importance of commitment to a  minimum of 150 minutes of exercise per week.  The importance of healthy food choices with portion control discussed. Encouraged to start a food diary, count calories and to consider  joining a support group. Sample diet sheets offered. Goals set by the patient for the next several  months.   Weight /BMI 05/15/2015 12/14/2014 11/22/2014  WEIGHT 168 lb 1.9 oz 167 lb 162 lb 0.6 oz  HEIGHT 5\' 3"  - 5\' 3"   BMI 29.79 kg/m2 29.59 kg/m2 28.71 kg/m2    Current exercise per week 120 minutes.    Metabolic syndrome X The increased risk of cardiovascular disease associated with this diagnosis, and the need to consistently work on lifestyle to change this is discussed. Following  a  heart healthy diet ,commitment to 30 minutes of exercise at least 5 days per week, as well as control of blood sugar and cholesterol , and achieving a healthy weight are all the areas to be addressed .    Hyperlipidemia LDL goal <100 Unchanged, pt to try pravastatin , importance of statin use based on personal and family history is stressed Hyperlipidemia:Low fat diet discussed and encouraged.   Lipid Panel  Lab Results  Component Value Date   CHOL 212* 05/11/2015   HDL 87 05/11/2015   LDLCALC 118* 05/11/2015   TRIG 36 05/11/2015   CHOLHDL 2.4 05/11/2015     `    FH: CAD (coronary artery disease) Younger sibling required 4V bypass surgery in 03/2015, she had stents prior to this Pt denies chest pain or fatigue on exertion   Mild dysplasia of cervix Normal pap in 11/2014

## 2015-05-15 NOTE — Assessment & Plan Note (Signed)
Improved Patient educated about the importance of limiting  Carbohydrate intake , the need to commit to daily physical activity for a minimum of 30 minutes , and to commit weight loss. The fact that changes in all these areas will reduce or eliminate all together the development of diabetes is stressed.   Diabetic Labs Latest Ref Rng 05/11/2015 09/09/2014 05/06/2014 11/26/2013 05/06/2013  HbA1c <5.7 % 6.4(H) 6.5(H) 6.1(H) 6.1(H) 5.8(H)  Chol 0 - 200 mg/dL 212(H) 180 215(H) 240(H) 225(H)  HDL >=46 mg/dL 87 68 74 86 74  Calc LDL 0 - 99 mg/dL 118(H) 104(H) 132(H) 147(H) 143(H)  Triglycerides <150 mg/dL 36 39 43 36 38  Creatinine 0.50 - 1.10 mg/dL 0.65 0.70 0.72 0.71 0.73   BP/Weight 05/15/2015 12/14/2014 11/22/2014 09/14/2014 05/11/2014 12/21/2013 11/55/2080  Systolic BP 223 361 224 497 530 051 102  Diastolic BP 80 80 70 80 82 80 80  Wt. (Lbs) 168.12 167 162.04 160 166.12 162 158.12  BMI 29.79 29.59 28.71 28.78 32.44 31.64 28.44   No flowsheet data found.

## 2015-07-30 ENCOUNTER — Other Ambulatory Visit: Payer: Self-pay | Admitting: Family Medicine

## 2015-07-30 DIAGNOSIS — Z1231 Encounter for screening mammogram for malignant neoplasm of breast: Secondary | ICD-10-CM

## 2015-08-10 ENCOUNTER — Ambulatory Visit (HOSPITAL_COMMUNITY)
Admission: RE | Admit: 2015-08-10 | Discharge: 2015-08-10 | Disposition: A | Discharge status: Home / Self Care | Payer: Commercial Managed Care - PPO | Source: Ambulatory Visit | Attending: Family Medicine | Admitting: Family Medicine

## 2015-08-10 DIAGNOSIS — Z1231 Encounter for screening mammogram for malignant neoplasm of breast: Secondary | ICD-10-CM | POA: Insufficient documentation

## 2015-10-05 ENCOUNTER — Other Ambulatory Visit: Payer: Self-pay | Admitting: Family Medicine

## 2015-10-06 LAB — COMPLETE METABOLIC PANEL WITH GFR
ALT: 16 U/L (ref 6–29)
AST: 26 U/L (ref 10–35)
Albumin: 3.9 g/dL (ref 3.6–5.1)
Alkaline Phosphatase: 83 U/L (ref 33–130)
BUN: 13 mg/dL (ref 7–25)
CHLORIDE: 99 mmol/L (ref 98–110)
CO2: 31 mmol/L (ref 20–31)
Calcium: 9.6 mg/dL (ref 8.6–10.4)
Creat: 0.65 mg/dL (ref 0.50–1.05)
GFR, Est African American: >89 mL/min (ref >=60)
Glucose, Bld: 83 mg/dL (ref 65–99)
Potassium: 3.5 mmol/L (ref 3.5–5.3)
SODIUM: 139 mmol/L (ref 135–146)
Total Bilirubin: 0.5 mg/dL (ref 0.2–1.2)
Total Protein: 6.6 g/dL (ref 6.1–8.1)

## 2015-10-06 LAB — LIPID PANEL
CHOL/HDL RATIO: 2.3 ratio (ref <=5.0)
Cholesterol: 195 mg/dL (ref 125–200)
HDL: 83 mg/dL (ref >=46)
LDL Cholesterol: 103 mg/dL (ref <130)
Triglycerides: 44 mg/dL (ref <150)
VLDL: 9 mg/dL (ref <30)

## 2015-10-06 LAB — HEMOGLOBIN A1C
HEMOGLOBIN A1C: 6.6 % — AB (ref <5.7)
MEAN PLASMA GLUCOSE: 143 mg/dL — AB (ref <117)

## 2015-10-10 ENCOUNTER — Ambulatory Visit (INDEPENDENT_AMBULATORY_CARE_PROVIDER_SITE_OTHER): Payer: Commercial Managed Care - PPO | Admitting: Family Medicine

## 2015-10-10 ENCOUNTER — Encounter: Payer: Self-pay | Admitting: Family Medicine

## 2015-10-10 ENCOUNTER — Other Ambulatory Visit (HOSPITAL_COMMUNITY)
Admission: RE | Admit: 2015-10-10 | Discharge: 2015-10-10 | Disposition: A | Discharge status: Home / Self Care | Payer: Commercial Managed Care - PPO | Source: Ambulatory Visit | Attending: Family Medicine | Admitting: Family Medicine

## 2015-10-10 VITALS — BP 130/74 | HR 70 | Resp 18 | Ht 63.0 in | Wt 166.0 lb

## 2015-10-10 DIAGNOSIS — I10 Essential (primary) hypertension: Secondary | ICD-10-CM

## 2015-10-10 DIAGNOSIS — Z23 Encounter for immunization: Secondary | ICD-10-CM | POA: Diagnosis not present

## 2015-10-10 DIAGNOSIS — Z113 Encounter for screening for infections with a predominantly sexual mode of transmission: Secondary | ICD-10-CM | POA: Diagnosis present

## 2015-10-10 DIAGNOSIS — E8881 Metabolic syndrome: Secondary | ICD-10-CM

## 2015-10-10 DIAGNOSIS — N760 Acute vaginitis: Secondary | ICD-10-CM | POA: Diagnosis present

## 2015-10-10 DIAGNOSIS — E785 Hyperlipidemia, unspecified: Secondary | ICD-10-CM

## 2015-10-10 DIAGNOSIS — R7302 Impaired glucose tolerance (oral): Secondary | ICD-10-CM

## 2015-10-10 DIAGNOSIS — R7303 Prediabetes: Secondary | ICD-10-CM

## 2015-10-10 DIAGNOSIS — N930 Postcoital and contact bleeding: Secondary | ICD-10-CM

## 2015-10-10 LAB — HIV ANTIBODY (ROUTINE TESTING W REFLEX): HIV 1&2 Ab, 4th Generation: NONREACTIVE

## 2015-10-10 LAB — HEPATITIS C ANTIBODY: HCV Ab: NEGATIVE

## 2015-10-10 NOTE — Progress Notes (Signed)
Kathleen Kirby     MRN: [id number]      DOB: Dec 05, 1961   HPI Kathleen Kirby is here for follow up and re-evaluation of chronic medical conditions, medication management and review of any available recent lab and radiology data.  Preventive health is updated, specifically  Cancer screening and Immunization.   Has form to be completed for work documenting her basic health data report is good overall Not as committed to diet and exercise as she wishes, unfortunately , blood sugar has risen The PT denies any adverse reactions to current medications since the last visit.  10 month h/o intermittent post coital spotting, no h/o dyspareunia or discharge, she is post menopausal  ROS Denies recent fever or chills. Denies sinus pressure, nasal congestion, ear pain or sore throat. Denies chest congestion, productive cough or wheezing. Denies chest pains, palpitations and leg swelling Denies abdominal pain, nausea, vomiting,diarrhea or constipation.   Denies dysuria, frequency, hesitancy or incontinence. Denies joint pain, swelling and limitation in mobility. Denies headaches, seizures, numbness, or tingling. Denies depression, anxiety or insomnia. Denies skin break down or rash.   PE  BP 130/74 mmHg  Pulse 70  Resp 18  Ht 5\' 3"  (1.6 m)  Wt 166 lb 0.6 oz (75.315 kg)  BMI 29.42 kg/m2  SpO2 100%  LMP 09/29/2012  Patient alert and oriented and in no cardiopulmonary distress.  HEENT: No facial asymmetry, EOMI,   oropharynx pink and moist.  Neck supple no JVD, no mass.  Chest: Clear to auscultation bilaterally.  CVS: S1, S2 no murmurs, no S3.Regular rate.  ABD: Soft non tender.  Pelvic: normal physiologic d/c , no blood in introitus, no cervical motion or adnexal tenderness, uterus enlarged, no palpable adnexal mas , though exam difficult Ext: No edema  MS: Adequate ROM spine, shoulders, hips and knees.  Skin: Intact, no ulcerations or rash noted.  Psych: Good eye contact, normal  affect. Memory intact not anxious or depressed appearing.  CNS: CN 2-12 intact, power,  normal throughout.no focal deficits noted.   Assessment & Plan   Essential hypertension Controlled, no change in medication DASH diet and commitment to daily physical activity for a minimum of 30 minutes discussed and encouraged, as a part of hypertension management. The importance of attaining a healthy weight is also discussed.  BP/Weight 10/10/2015 05/15/2015 12/14/2014 11/22/2014 09/14/2014 7/82/4235 02/17/1442  Systolic BP 154 008 676 195 093 267 124  Diastolic BP 74 80 80 70 80 82 80  Wt. (Lbs) 166.04 168.12 167 162.04 160 166.12 162  BMI 29.42 29.79 29.59 28.71 28.78 32.44 31.64        Hyperlipidemia LDL goal <100 Controlled, no change in medication Hyperlipidemia:Low fat diet discussed and encouraged.   Lipid Panel  Lab Results  Component Value Date   CHOL 195 10/05/2015   HDL 83 10/05/2015   LDLCALC 103 10/05/2015   TRIG 44 10/05/2015   CHOLHDL 2.3 10/05/2015        IGT (impaired glucose tolerance) Deteriorated Patient educated about the importance of limiting  Carbohydrate intake , the need to commit to daily physical activity for a minimum of 30 minutes , and to commit weight loss. The fact that changes in all these areas will reduce or eliminate all together the development of diabetes is stressed.   Diabetic Labs Latest Ref Rng 10/05/2015 05/11/2015 09/09/2014 05/06/2014 11/26/2013  HbA1c <5.7 % 6.6(H) 6.4(H) 6.5(H) 6.1(H) 6.1(H)  Chol 125 - 200 mg/dL 195 212(H) 180 215(H) 240(H)  HDL >=  46 mg/dL 83 87 68 74 86  Calc LDL <130 mg/dL 103 118(H) 104(H) 132(H) 147(H)  Triglycerides <150 mg/dL 44 36 39 43 36  Creatinine 0.50 - 1.05 mg/dL 0.65 0.65 0.70 0.72 0.71   BP/Weight 10/10/2015 05/15/2015 12/14/2014 11/22/2014 09/14/2014 6/88/6484 06/15/720  Systolic BP 828 833 744 514 604 799 872  Diastolic BP 74 80 80 70 80 82 80  Wt. (Lbs) 166.04 168.12 167 162.04 160 166.12 162    BMI 29.42 29.79 29.59 28.71 28.78 32.44 31.64   No flowsheet data found.     Metabolic syndrome X The increased risk of cardiovascular disease associated with this diagnosis, and the need to consistently work on lifestyle to change this is discussed. Following  a  heart healthy diet ,commitment to 30 minutes of exercise at least 5 days per week, as well as control of blood sugar and cholesterol , and achieving a healthy weight are all the areas to be addressed .   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

## 2015-10-10 NOTE — Assessment & Plan Note (Signed)
Deteriorated Patient educated about the importance of limiting  Carbohydrate intake , the need to commit to daily physical activity for a minimum of 30 minutes , and to commit weight loss. The fact that changes in all these areas will reduce or eliminate all together the development of diabetes is stressed.   Diabetic Labs Latest Ref Rng 10/05/2015 05/11/2015 09/09/2014 05/06/2014 11/26/2013  HbA1c <5.7 % 6.6(H) 6.4(H) 6.5(H) 6.1(H) 6.1(H)  Chol 125 - 200 mg/dL 195 212(H) 180 215(H) 240(H)  HDL >=46 mg/dL 83 87 68 74 86  Calc LDL <130 mg/dL 103 118(H) 104(H) 132(H) 147(H)  Triglycerides <150 mg/dL 44 36 39 43 36  Creatinine 0.50 - 1.05 mg/dL 0.65 0.65 0.70 0.72 0.71   BP/Weight 10/10/2015 05/15/2015 12/14/2014 11/22/2014 09/14/2014 1/77/9390 3/0/0923  Systolic BP 300 762 263 335 456 256 389  Diastolic BP 74 80 80 70 80 82 80  Wt. (Lbs) 166.04 168.12 167 162.04 160 166.12 162  BMI 29.42 29.79 29.59 28.71 28.78 32.44 31.64   No flowsheet data found.

## 2015-10-10 NOTE — Assessment & Plan Note (Signed)
The increased risk of cardiovascular disease associated with this diagnosis, and the need to consistently work on lifestyle to change this is discussed. Following  a  heart healthy diet ,commitment to 30 minutes of exercise at least 5 days per week, as well as control of blood sugar and cholesterol , and achieving a healthy weight are all the areas to be addressed .  

## 2015-10-10 NOTE — Assessment & Plan Note (Signed)
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

## 2015-10-10 NOTE — Progress Notes (Signed)
   Subjective:    Patient ID: Kathleen Kirby, female    DOB: 08-04-1961, 54 y.o.   MRN: [id number]  HPI    Review of Systems     Objective:   Physical Exam        Assessment & Plan:

## 2015-10-10 NOTE — Assessment & Plan Note (Signed)
Controlled, no change in medication Hyperlipidemia:Low fat diet discussed and encouraged.   Lipid Panel  Lab Results  Component Value Date   CHOL 195 10/05/2015   HDL 83 10/05/2015   LDLCALC 103 10/05/2015   TRIG 44 10/05/2015   CHOLHDL 2.3 10/05/2015

## 2015-10-10 NOTE — Assessment & Plan Note (Signed)
Controlled, no change in medication DASH diet and commitment to daily physical activity for a minimum of 30 minutes discussed and encouraged, as a part of hypertension management. The importance of attaining a healthy weight is also discussed.  BP/Weight 10/10/2015 05/15/2015 12/14/2014 11/22/2014 09/14/2014 6/96/2952 07/19/1323  Systolic BP 401 027 253 664 403 474 259  Diastolic BP 74 80 80 70 80 82 80  Wt. (Lbs) 166.04 168.12 167 162.04 160 166.12 162  BMI 29.42 29.79 29.59 28.71 28.78 32.44 31.64

## 2015-10-10 NOTE — Patient Instructions (Signed)
CPE in 4.5 month, call if you need me sooner  Flu vaccine today  .  Blood sugar slightly increased so please reduce cakes  You will be contacted with results when available, and further management decided at that time   Please work on good  health habits so that your health will improve. 1. Commitment to daily physical activity for 30 to 60  minutes, if you are able to do this.  2. Commitment to wise food choices. Aim for half of your  food intake to be vegetable and fruit, one quarter starchy foods, and one quarter protein. Try to eat on a regular schedule  3 meals per day, snacking between meals should be limited to vegetables or fruits or small portions of nuts. 64 ounces of water per day is generally recommended, unless you have specific health conditions, like heart failure or kidney failure where you will need to limit fluid intake.  3. Commitment to sufficient and a  good quality of physical and mental rest daily, generally between 6 to 8 hours per day.  WITH PERSISTANCE AND PERSEVERANCE, THE IMPOSSIBLE , BECOMES THE NORM! Thanks for choosing [facility], we consider it a privelige to serve you. HBA1C , chem 7 and EGFR in 4 month  IF Blood sugar average is 6.5 or more you will be labeled diabetic!

## 2015-10-12 LAB — CERVICOVAGINAL ANCILLARY ONLY
Chlamydia: NEGATIVE
Neisseria Gonorrhea: NEGATIVE
WET PREP (BD AFFIRM): POSITIVE — AB

## 2015-10-15 MED ORDER — FLUCONAZOLE 150 MG PO TABS: 150.0000 mg | ORAL_TABLET | Freq: Once | ORAL | Status: DC

## 2015-10-15 MED ORDER — METRONIDAZOLE 500 MG PO TABS: 500.0000 mg | ORAL_TABLET | Freq: Two times a day (BID) | ORAL | Status: DC

## 2015-10-15 NOTE — Addendum Note (Signed)
Addended by: Denman George B on: 10/15/2015 08:46 AM   Modules accepted: Orders

## 2015-11-19 ENCOUNTER — Other Ambulatory Visit: Payer: Self-pay | Admitting: Family Medicine

## 2015-12-26 ENCOUNTER — Other Ambulatory Visit: Payer: Self-pay | Admitting: Family Medicine

## 2016-01-09 ENCOUNTER — Other Ambulatory Visit: Payer: Self-pay

## 2016-01-09 ENCOUNTER — Telehealth: Payer: Self-pay | Admitting: Family Medicine

## 2016-01-09 MED ORDER — TRIAMTERENE-HCTZ 75-50 MG PO TABS: ORAL_TABLET | ORAL | Status: DC

## 2016-01-09 MED ORDER — AMLODIPINE BESYLATE 5 MG PO TABS: ORAL_TABLET | ORAL | Status: DC

## 2016-01-09 NOTE — Telephone Encounter (Signed)
Med refilled.

## 2016-01-09 NOTE — Telephone Encounter (Signed)
Patient is asking for refills to be sent to [facility] for triamterene-hydrochlorothiazide (MAXZIDE) 75-50 MG tablet and amLODipine (NORVASC) 5 MG tablet

## 2016-02-11 ENCOUNTER — Encounter: Payer: Commercial Managed Care - PPO | Admitting: Family Medicine

## 2016-02-26 ENCOUNTER — Encounter: Payer: Commercial Managed Care - PPO | Admitting: Family Medicine

## 2016-03-23 LAB — COMPLETE METABOLIC PANEL WITH GFR
ALT: 20 U/L (ref 6–29)
AST: 24 U/L (ref 10–35)
Albumin: 3.8 g/dL (ref 3.6–5.1)
Alkaline Phosphatase: 89 U/L (ref 33–130)
BUN: 14 mg/dL (ref 7–25)
CO2: 29 mmol/L (ref 20–31)
Calcium: 9.1 mg/dL (ref 8.6–10.4)
Chloride: 99 mmol/L (ref 98–110)
Creat: 0.76 mg/dL (ref 0.50–1.05)
GFR, EST NON AFRICAN AMERICAN: 89 mL/min (ref >=60)
GFR, Est African American: >89 mL/min (ref >=60)
GLUCOSE: 95 mg/dL (ref 65–99)
POTASSIUM: 4 mmol/L (ref 3.5–5.3)
SODIUM: 135 mmol/L (ref 135–146)
Total Bilirubin: 0.4 mg/dL (ref 0.2–1.2)
Total Protein: 6.8 g/dL (ref 6.1–8.1)

## 2016-03-23 LAB — HEMOGLOBIN A1C
Hgb A1c MFr Bld: 6.7 % — ABNORMAL HIGH (ref <5.7)
MEAN PLASMA GLUCOSE: 146 mg/dL

## 2016-03-26 ENCOUNTER — Ambulatory Visit (INDEPENDENT_AMBULATORY_CARE_PROVIDER_SITE_OTHER): Payer: Commercial Managed Care - PPO | Admitting: Family Medicine

## 2016-03-26 ENCOUNTER — Other Ambulatory Visit (HOSPITAL_COMMUNITY)
Admission: RE | Admit: 2016-03-26 | Discharge: 2016-03-26 | Disposition: A | Discharge status: Home / Self Care | Payer: Commercial Managed Care - PPO | Source: Ambulatory Visit | Attending: Family Medicine | Admitting: Family Medicine

## 2016-03-26 ENCOUNTER — Encounter: Payer: Self-pay | Admitting: Family Medicine

## 2016-03-26 VITALS — BP 138/80 | HR 77 | Resp 18 | Ht 63.0 in | Wt 169.0 lb

## 2016-03-26 DIAGNOSIS — N870 Mild cervical dysplasia: Secondary | ICD-10-CM

## 2016-03-26 DIAGNOSIS — E669 Obesity, unspecified: Secondary | ICD-10-CM

## 2016-03-26 DIAGNOSIS — R7302 Impaired glucose tolerance (oral): Secondary | ICD-10-CM

## 2016-03-26 DIAGNOSIS — Z0000 Encounter for general adult medical examination without abnormal findings: Secondary | ICD-10-CM | POA: Diagnosis not present

## 2016-03-26 DIAGNOSIS — Z1151 Encounter for screening for human papillomavirus (HPV): Secondary | ICD-10-CM | POA: Insufficient documentation

## 2016-03-26 DIAGNOSIS — Z23 Encounter for immunization: Secondary | ICD-10-CM

## 2016-03-26 DIAGNOSIS — Z124 Encounter for screening for malignant neoplasm of cervix: Secondary | ICD-10-CM

## 2016-03-26 DIAGNOSIS — Z01411 Encounter for gynecological examination (general) (routine) with abnormal findings: Secondary | ICD-10-CM | POA: Diagnosis present

## 2016-03-26 DIAGNOSIS — I10 Essential (primary) hypertension: Secondary | ICD-10-CM | POA: Diagnosis not present

## 2016-03-26 DIAGNOSIS — E785 Hyperlipidemia, unspecified: Secondary | ICD-10-CM

## 2016-03-26 DIAGNOSIS — E1169 Type 2 diabetes mellitus with other specified complication: Secondary | ICD-10-CM

## 2016-03-26 DIAGNOSIS — E1159 Type 2 diabetes mellitus with other circulatory complications: Secondary | ICD-10-CM | POA: Insufficient documentation

## 2016-03-26 DIAGNOSIS — Z1211 Encounter for screening for malignant neoplasm of colon: Secondary | ICD-10-CM | POA: Diagnosis not present

## 2016-03-26 DIAGNOSIS — E119 Type 2 diabetes mellitus without complications: Secondary | ICD-10-CM | POA: Insufficient documentation

## 2016-03-26 LAB — POC HEMOCCULT BLD/STL (OFFICE/1-CARD/DIAGNOSTIC): Fecal Occult Blood, POC: NEGATIVE

## 2016-03-26 NOTE — Patient Instructions (Signed)
F/u in 4 month, call if you need me sooner  Limit carb intake at each meal to 30 to 45 gm and snack on vegetables  Fasting labs in 4 months  TdAP today  Thank you  for choosing [facility]. We consider it a privelige to serve you.  Delivering excellent health care in a caring and  compassionate way is our goal.  Partnering with you,  so that together we can achieve this goal is our strategy.  Normal foot  Exam today

## 2016-03-27 DIAGNOSIS — Z23 Encounter for immunization: Secondary | ICD-10-CM | POA: Insufficient documentation

## 2016-03-27 DIAGNOSIS — Z0000 Encounter for general adult medical examination without abnormal findings: Secondary | ICD-10-CM | POA: Insufficient documentation

## 2016-03-27 NOTE — Assessment & Plan Note (Signed)
Ms. Beerman is reminded of the importance of commitment to daily physical activity for 30 minutes or more, as able and the need to limit carbohydrate intake to 30 to 60 grams per meal to help with blood sugar control.    Ms. Toia is reminded of the importance of daily foot exam, annual eye examination, and good blood sugar, blood pressure and cholesterol control.  Diabetic Labs Latest Ref Rng 10/10/2015 10/05/2015 05/11/2015 09/09/2014 05/06/2014  HbA1c <5.7 % 6.7(H) 6.6(H) 6.4(H) 6.5(H) 6.1(H)  Chol 125 - 200 mg/dL - 195 212(H) 180 215(H)  HDL >=46 mg/dL - 83 87 68 74  Calc LDL <130 mg/dL - 103 118(H) 104(H) 132(H)  Triglycerides <150 mg/dL - 44 36 39 43  Creatinine 0.50 - 1.05 mg/dL 0.76 0.65 0.65 0.70 0.72   BP/Weight 03/26/2016 10/10/2015 05/15/2015 12/14/2014 11/22/2014 09/14/2014 [id number]  Systolic BP 0000000 [id number]  Diastolic BP 80 74 80 80 70 80 82  Wt. (Lbs) 169 166.04 168.12 167 162.04 160 166.12  BMI 29.94 29.42 29.79 29.59 28.71 28.78 32.44   No flowsheet data found.

## 2016-03-27 NOTE — Assessment & Plan Note (Signed)

## 2016-03-27 NOTE — Assessment & Plan Note (Signed)
Pap sent ?

## 2016-03-27 NOTE — Assessment & Plan Note (Signed)
After obtaining informed consent, the vaccine is  administered by LPN.  

## 2016-03-27 NOTE — Progress Notes (Signed)
Subjective:    Patient ID: Kathleen Kirby, female    DOB: 09/18/1961, 55 y.o.   MRN: [id number]  HPI Patient is in for annual physical exam.  Recent labs, are reviewed Immunization is reviewed , and  updated .    Review of Systems See HPI     Objective:   Physical Exam BP 138/80 mmHg  Pulse 77  Resp 18  Ht 5\' 3"  (1.6 m)  Wt 169 lb (76.658 kg)  BMI 29.94 kg/m2  SpO2 100%  LMP 09/29/2012  Pleasant well nourished female, alert and oriented x 3, in no cardio-pulmonary distress. Afebrile. HEENT No facial trauma or asymetry. Sinuses non tender.  Extra occullar muscles intact, pupils equally reactive to light. External ears normal, left TM occluded, right TM clear Oropharynx moist, no exudate, good dentition. Neck: supple, no adenopathy,JVD or thyromegaly.No bruits.  Chest: Clear to ascultation bilaterally.No crackles or wheezes. Non tender to palpation  Breast: No asymetry,no masses or lumps. No tenderness. No nipple discharge or inversion. No axillary or supraclavicular adenopathy  Cardiovascular system; Heart sounds normal,  S1 and  S2 ,no S3.  No murmur, or thrill. Apical beat not displaced Peripheral pulses normal.  Abdomen: Soft, non tender, no organomegaly or masses. No bruits. Bowel sounds normal. No guarding, tenderness or rebound.  Rectal:  Normal sphincter tone. No mass.No rectal masses.  Guaiac negative stool.  GU: External genitalia normal female genitalia , female distribution of hair. No lesions. Urethral meatus normal in size, no  Prolapse, no lesions visibly  Present. Bladder non tender. Vagina pink and moist , with no visible lesions , discharge present . Adequate pelvic support no  cystocele or rectocele noted Cervix pink and appears healthy, no lesions or ulcerations noted, no discharge noted from os Uterus normal size, no adnexal masses, no cervical motion or adnexal tenderness.   Musculoskeletal exam: Full ROM of spine, hips ,  shoulders and knees. No deformity ,swelling or crepitus noted. No muscle wasting or atrophy.   Neurologic: Cranial nerves 2 to 12 intact. Power, tone ,sensation and reflexes normal throughout. No disturbance in gait. No tremor.  Skin: Intact, no ulceration, erythema , scaling or rash noted. Pigmentation normal throughout  Psych; Normal mood and affect. Judgement and concentration normal        Assessment & Plan:  Annual physical exam Annual exam as documented. Counseling done  re healthy lifestyle involving commitment to 150 minutes exercise per week, heart healthy diet, and attaining healthy weight.The importance of adequate sleep also discussed. Regular seat belt use and home safety, is also discussed. Changes in health habits are decided on by the patient with goals and time frames  set for achieving them. Immunization and cancer screening needs are specifically addressed at this visit.   Type 2 diabetes, diet controlled (Kathleen Kirby) Kathleen Kirby is reminded of the importance of commitment to daily physical activity for 30 minutes or more, as able and the need to limit carbohydrate intake to 30 to 60 grams per meal to help with blood sugar control.    Kathleen Kirby is reminded of the importance of daily foot exam, annual eye examination, and good blood sugar, blood pressure and cholesterol control.  Diabetic Labs Latest Ref Rng 10/10/2015 10/05/2015 05/11/2015 09/09/2014 05/06/2014  HbA1c <5.7 % 6.7(H) 6.6(H) 6.4(H) 6.5(H) 6.1(H)  Chol 125 - 200 mg/dL - 195 212(H) 180 215(H)  HDL >=46 mg/dL - 83 87 68 74  Calc LDL <130 mg/dL - 103 118(H) 104(H) 132(H)  Triglycerides <  150 mg/dL - 44 36 39 43  Creatinine 0.50 - 1.05 mg/dL 0.76 0.65 0.65 0.70 0.72   BP/Weight 03/26/2016 10/10/2015 05/15/2015 12/14/2014 11/22/2014 09/14/2014 [id number]  Systolic BP 0000000 [id number] [id number] AB-[id number] 0000000 [id number]  Diastolic BP 80 74 80 80 70 80 82  Wt. (Lbs) 169 166.04 168.12 167 162.04 160 166.12  BMI 29.94 29.42 29.79 29.59  28.71 28.78 32.44   No flowsheet data found.       Mild dysplasia of cervix Pap sent  Need for Tdap vaccination After obtaining informed consent, the vaccine is  administered by LPN.

## 2016-03-28 LAB — CYTOLOGY - PAP

## 2016-04-11 ENCOUNTER — Telehealth: Payer: Self-pay | Admitting: Family Medicine

## 2016-04-11 NOTE — Telephone Encounter (Signed)
Kathleen Kirby is returning your phone call Kathleen Kirby

## 2016-04-14 NOTE — Telephone Encounter (Signed)
See result note on pap. Kathleen Kirby has open note on patient

## 2016-04-14 NOTE — Telephone Encounter (Signed)
Called pt left message to call back

## 2016-04-18 NOTE — Addendum Note (Signed)
Addended by: Eual Fines on: 04/18/2016 08:25 AM   Modules accepted: Orders

## 2016-05-02 ENCOUNTER — Encounter: Payer: Self-pay | Admitting: Obstetrics & Gynecology

## 2016-05-02 ENCOUNTER — Ambulatory Visit (INDEPENDENT_AMBULATORY_CARE_PROVIDER_SITE_OTHER): Payer: Commercial Managed Care - PPO | Admitting: Obstetrics & Gynecology

## 2016-05-02 VITALS — BP 130/60 | HR 72 | Ht 61.2 in | Wt 168.0 lb

## 2016-05-02 DIAGNOSIS — IMO0002 Reserved for concepts with insufficient information to code with codable children: Secondary | ICD-10-CM

## 2016-05-02 DIAGNOSIS — R87612 Low grade squamous intraepithelial lesion on cytologic smear of cervix (LGSIL): Secondary | ICD-10-CM | POA: Diagnosis not present

## 2016-05-02 DIAGNOSIS — R87610 Atypical squamous cells of undetermined significance on cytologic smear of cervix (ASC-US): Secondary | ICD-10-CM | POA: Diagnosis not present

## 2016-05-02 NOTE — Progress Notes (Signed)
Patient ID: Kathleen Kirby, female   DOB: 1961-04-27, 55 y.o.   MRN: [id number]   Chief Complaint  Patient presents with  . Colposcopy    abnormal pap    Blood pressure 130/60, pulse 72, height 5' 1.2" (1.554 m), weight 168 lb (76.204 kg), last menstrual period 09/29/2012.   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 The prior pap showed no abnormalities.  Prior cervical/vaginal disease: normal exam without visible pathology. Prior cervical treatment: colposcopy.  Had an ASCUS +HR HPV 3 years ago with negative colposcopic evalaution Previous note attached below  Expand All Collapse All   Patient ID: Kathleen Kirby, female DOB: 1961/02/23, 55 y.o. MRN: [id number] 55 y.o. G0P) No LMP recorded. Patient is not currently having periods (Reason: Perimenopausal). With LSIL on Pap smear performed in 11/2013. Previous Pap 11/2012 was normal  Here for colposcopic evaluation  Colposcopy 3% acetic acid used  No punctation No mosaicism No abnormal vessels seen  Normal colposcopy  Recommend repeat Pap smear per routine in 11/2014          Smoker:  No. New sexual partner:  Yes.    : time frame:  1 year or so  History of abnormal Pap: yes  Procedure Details  The risks and benefits of the procedure and Written informed consent obtained.  Speculum placed in vagina and excellent visualization of cervix achieved, cervix swabbed x 3 with acetic acid solution.  Findings: Cervix: no visible lesions, no mosaicism, no punctation, no abnormal vasculature and acetowhite lchanges are presentno biopsies taken. Vaginal inspection: vaginal colposcopy  performed. Vulvar colposcopy: vulvar colposcopy not performed.  Specimens: none  Complications: none.  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

## 2016-06-10 ENCOUNTER — Other Ambulatory Visit: Payer: Self-pay | Admitting: Family Medicine

## 2016-07-18 ENCOUNTER — Other Ambulatory Visit: Payer: Self-pay | Admitting: Family Medicine

## 2016-07-18 ENCOUNTER — Telehealth: Payer: Self-pay | Admitting: Family Medicine

## 2016-07-18 ENCOUNTER — Other Ambulatory Visit: Payer: Self-pay

## 2016-07-18 DIAGNOSIS — Z1231 Encounter for screening mammogram for malignant neoplasm of breast: Secondary | ICD-10-CM

## 2016-07-18 MED ORDER — AMLODIPINE BESYLATE 5 MG PO TABS: 5.0000 mg | ORAL_TABLET | Freq: Every day | ORAL | 1 refills | Status: DC

## 2016-07-18 MED ORDER — TRIAMTERENE-HCTZ 75-50 MG PO TABS: 1.0000 | ORAL_TABLET | Freq: Every day | ORAL | 1 refills | Status: DC

## 2016-07-18 MED ORDER — PRAVASTATIN SODIUM 10 MG PO TABS: ORAL_TABLET | ORAL | 1 refills | Status: DC

## 2016-07-18 NOTE — Telephone Encounter (Signed)
Pt will need refills on her meds before she comes back to her apt. She had to reschedule her apt to October.

## 2016-07-18 NOTE — Telephone Encounter (Signed)
meds refilled 

## 2016-08-14 ENCOUNTER — Ambulatory Visit: Payer: Commercial Managed Care - PPO | Admitting: Family Medicine

## 2016-08-15 ENCOUNTER — Ambulatory Visit (HOSPITAL_COMMUNITY): Payer: Commercial Managed Care - PPO

## 2016-09-05 ENCOUNTER — Ambulatory Visit (HOSPITAL_COMMUNITY)
Admission: RE | Admit: 2016-09-05 | Discharge: 2016-09-05 | Disposition: A | Discharge status: Home / Self Care | Payer: Commercial Managed Care - PPO | Source: Ambulatory Visit | Attending: Family Medicine | Admitting: Family Medicine

## 2016-09-05 ENCOUNTER — Ambulatory Visit: Payer: Commercial Managed Care - PPO

## 2016-09-05 DIAGNOSIS — Z1231 Encounter for screening mammogram for malignant neoplasm of breast: Secondary | ICD-10-CM

## 2016-09-09 ENCOUNTER — Encounter: Payer: Self-pay | Admitting: Family Medicine

## 2016-09-18 ENCOUNTER — Ambulatory Visit: Payer: Commercial Managed Care - PPO | Admitting: Family Medicine

## 2016-10-04 LAB — COMPLETE METABOLIC PANEL WITH GFR
ALBUMIN: 3.9 g/dL (ref 3.6–5.1)
ALK PHOS: 83 U/L (ref 33–130)
ALT: 17 U/L (ref 6–29)
AST: 25 U/L (ref 10–35)
BUN: 12 mg/dL (ref 7–25)
CALCIUM: 9.2 mg/dL (ref 8.6–10.4)
CHLORIDE: 99 mmol/L (ref 98–110)
CO2: 31 mmol/L (ref 20–31)
CREATININE: 0.7 mg/dL (ref 0.50–1.05)
GFR, Est African American: >89 mL/min (ref >=60)
GFR, Est Non African American: >89 mL/min (ref >=60)
GLUCOSE: 91 mg/dL (ref 65–99)
POTASSIUM: 3.7 mmol/L (ref 3.5–5.3)
SODIUM: 136 mmol/L (ref 135–146)
Total Bilirubin: 0.4 mg/dL (ref 0.2–1.2)
Total Protein: 6.7 g/dL (ref 6.1–8.1)

## 2016-10-04 LAB — CBC
HEMATOCRIT: 39.3 % (ref 35.0–45.0)
HEMOGLOBIN: 12.8 g/dL (ref 11.7–15.5)
MCH: 25.4 pg — ABNORMAL LOW (ref 27.0–33.0)
MCHC: 32.6 g/dL (ref 32.0–36.0)
MCV: 78 fL — ABNORMAL LOW (ref 80.0–100.0)
MPV: 11.2 fL (ref 7.5–12.5)
Platelets: 289 10*3/uL (ref 140–400)
RBC: 5.04 MIL/uL (ref 3.80–5.10)
RDW: 13.9 % (ref 11.0–15.0)
WBC: 5.1 10*3/uL (ref 3.8–10.8)

## 2016-10-04 LAB — LIPID PANEL
CHOLESTEROL: 201 mg/dL — AB (ref 125–200)
HDL: 93 mg/dL (ref >=46)
LDL CALC: 98 mg/dL (ref <130)
TRIGLYCERIDES: 51 mg/dL (ref <150)
Total CHOL/HDL Ratio: 2.2 Ratio (ref <=5.0)
VLDL: 10 mg/dL (ref <30)

## 2016-10-04 LAB — HEMOGLOBIN A1C
Hgb A1c MFr Bld: 6.2 % — ABNORMAL HIGH (ref <5.7)
MEAN PLASMA GLUCOSE: 131 mg/dL

## 2016-10-04 LAB — TSH: TSH: 0.76 mIU/L

## 2016-10-09 ENCOUNTER — Ambulatory Visit (INDEPENDENT_AMBULATORY_CARE_PROVIDER_SITE_OTHER): Payer: Commercial Managed Care - PPO | Admitting: Family Medicine

## 2016-10-09 ENCOUNTER — Encounter: Payer: Self-pay | Admitting: Family Medicine

## 2016-10-09 VITALS — BP 140/80 | HR 68 | Ht 61.0 in | Wt 166.0 lb

## 2016-10-09 DIAGNOSIS — I10 Essential (primary) hypertension: Secondary | ICD-10-CM

## 2016-10-09 DIAGNOSIS — E119 Type 2 diabetes mellitus without complications: Secondary | ICD-10-CM | POA: Diagnosis not present

## 2016-10-09 DIAGNOSIS — Z23 Encounter for immunization: Secondary | ICD-10-CM

## 2016-10-09 DIAGNOSIS — E669 Obesity, unspecified: Secondary | ICD-10-CM

## 2016-10-09 DIAGNOSIS — E785 Hyperlipidemia, unspecified: Secondary | ICD-10-CM

## 2016-10-09 NOTE — Patient Instructions (Signed)
Annual physical exam April 13 or after, call if you need me sooner  Flu and pneumonia 23 vaccines today  cONGRATS on mucH IMPROVED labs and health, kEEP iT uPp  Healthy eating and regular exercise.  Fasting lipid, cmp and eGFR, hBA1c, cBC, tSH and Vit D and microalb first week in April   Thank you  for choosing [facility]. We consider it a privelige to serve you.  Delivering excellent health care in a caring and  compassionate way is our goal.  Partnering with you,  so that together we can achieve this goal is our strategy.

## 2016-10-10 NOTE — Progress Notes (Signed)
TERRIS Kirby     MRN: [id number]      DOB: 24-Dec-1960   HPI [name] is here for follow up and re-evaluation of chronic medical conditions, medication management and review of any available recent lab and radiology data.  Preventive health is updated, specifically  Cancer screening and Immunization.   Questions or concerns regarding consultations or procedures which the PT has had in the interim are  addressed. The PT denies any adverse reactions to current medications since the last visit.  There are no new concerns.  There are no specific complaints  Excellent results with lifestyle modification, as far as blood sugar is concerned  ROS Denies recent fever or chills. Denies sinus pressure, nasal congestion, ear pain or sore throat. Denies chest congestion, productive cough or wheezing. Denies chest pains, palpitations and leg swelling Denies abdominal pain, nausea, vomiting,diarrhea or constipation.   Denies dysuria, frequency, hesitancy or incontinence. Denies joint pain, swelling and limitation in mobility. Denies headaches, seizures, numbness, or tingling. Denies depression, anxiety or insomnia. Denies skin break down or rash.   PE  BP 140/80   Pulse 68   Ht 5\' 1"  (1.549 m)   Wt 166 lb (75.3 kg)   LMP 09/29/2012   SpO2 97%   BMI 31.37 kg/m   Patient alert and oriented and in no cardiopulmonary distress.  HEENT: No facial asymmetry, EOMI,   oropharynx pink and moist.  Neck supple no JVD, no mass.  Chest: Clear to auscultation bilaterally.  CVS: S1, S2 no murmurs, no S3.Regular rate.  ABD: Soft non tender.   Ext: No edema  MS: Adequate ROM spine, shoulders, hips and knees.  Skin: Intact, no ulcerations or rash noted.  Psych: Good eye contact, normal affect. Memory intact not anxious or depressed appearing.  CNS: CN 2-12 intact, power,  normal throughout.no focal deficits noted.   Assessment & Plan  Essential hypertension Controlled, no change in  medication DASH diet and commitment to daily physical activity for a minimum of 30 minutes discussed and encouraged, as a part of hypertension management. The importance of attaining a healthy weight is also discussed.  BP/Weight 10/09/2016 05/02/2016 03/26/2016 10/10/2015 05/15/2015 12/14/2014 [id number]  Systolic BP [id number] [id number] 0000000 AB-[id number] AB-[id number] AB-[id number]  Diastolic BP 80 60 80 74 80 80 70  Wt. (Lbs) 166 168 169 166.04 168.12 167 162.04  BMI 31.37 31.56 29.94 29.42 29.79 29.59 28.71       Hyperlipidemia LDL goal <100 Improved No med change Hyperlipidemia:Low fat diet discussed and encouraged.   Lipid Panel  Lab Results  Component Value Date   CHOL 201 (H) 10/04/2016   HDL 93 10/04/2016   LDLCALC 98 10/04/2016   TRIG 51 10/04/2016   CHOLHDL 2.2 10/04/2016       Type 2 diabetes, diet controlled ([facility]) Marked improvement, pt applauded on this, no change in management which is diet controlled [name] is reminded of the importance of commitment to daily physical activity for 30 minutes or more, as able and the need to limit carbohydrate intake to 30 to 60 grams per meal to help with blood sugar control.     [name] is reminded of the importance of daily foot exam, annual eye examination, and good blood sugar, blood pressure and cholesterol control.  Diabetic Labs Latest Ref Rng & Units 10/04/2016 10/10/2015 10/05/2015 05/11/2015 09/09/2014  HbA1c <5.7 % 6.2(H) 6.7(H) 6.6(H) 6.4(H) 6.5(H)  Chol 125 - 200 mg/dL 201(H) - 195 212(H) 180  HDL >=  46 mg/dL 93 - 83 87 68  Calc LDL <130 mg/dL 98 - 103 118(H) 104(H)  Triglycerides <150 mg/dL 51 - 44 36 39  Creatinine 0.50 - 1.05 mg/dL 0.70 0.76 0.65 0.65 0.70   BP/Weight 10/09/2016 05/02/2016 03/26/2016 10/10/2015 05/15/2015 12/14/2014 [id number]  Systolic BP XX123456 AB-123456789 0000000 AB-123456789 AB-123456789 [id number] AB-[id number]  Diastolic BP 80 60 80 74 80 80 70  Wt. (Lbs) 166 168 169 166.04 168.12 167 162.04  BMI 31.37 31.56 29.94 29.42 29.79 29.59 28.71   Foot/eye exam completion  dates 03/26/2016  Foot Form Completion Done        Need for 23-polyvalent pneumococcal polysaccharide vaccine After obtaining informed consent, the vaccine is  administered by LPN.   Need for prophylactic vaccination and inoculation against influenza After obtaining informed consent, the vaccine is  administered by LPN.   Obesity (BMI 30.0-34.9) Unchanged. Patient re-educated about  the importance of commitment to a  minimum of 150 minutes of exercise per week.  The importance of healthy food choices with portion control discussed. Encouraged to start a food diary, count calories and to consider  joining a support group. Sample diet sheets offered. Goals set by the patient for the next several months.   Weight /BMI 10/09/2016 05/02/2016 03/26/2016  WEIGHT 166 lb 168 lb 169 lb  HEIGHT 5\' 1"  5' 1.2" 5\' 3"   BMI 31.37 kg/m2 31.56 kg/m2 29.94 kg/m2

## 2016-10-10 NOTE — Assessment & Plan Note (Signed)
Improved No med change Hyperlipidemia:Low fat diet discussed and encouraged.   Lipid Panel  Lab Results  Component Value Date   CHOL 201 (H) 10/04/2016   HDL 93 10/04/2016   LDLCALC 98 10/04/2016   TRIG 51 10/04/2016   CHOLHDL 2.2 10/04/2016

## 2016-10-10 NOTE — Assessment & Plan Note (Signed)
Marked improvement, pt applauded on this, no change in management which is diet controlled Kathleen Kirby is reminded of the importance of commitment to daily physical activity for 30 minutes or more, as able and the need to limit carbohydrate intake to 30 to 60 grams per meal to help with blood sugar control.     Kathleen Kirby is reminded of the importance of daily foot exam, annual eye examination, and good blood sugar, blood pressure and cholesterol control.  Diabetic Labs Latest Ref Rng & Units 10/04/2016 10/10/2015 10/05/2015 05/11/2015 09/09/2014  HbA1c <5.7 % 6.2(H) 6.7(H) 6.6(H) 6.4(H) 6.5(H)  Chol 125 - 200 mg/dL 201(H) - 195 212(H) 180  HDL >=46 mg/dL 93 - 83 87 68  Calc LDL <130 mg/dL 98 - 103 118(H) 104(H)  Triglycerides <150 mg/dL 51 - 44 36 39  Creatinine 0.50 - 1.05 mg/dL 0.70 0.76 0.65 0.65 0.70   BP/Weight 10/09/2016 05/02/2016 03/26/2016 10/10/2015 05/15/2015 12/14/2014 [id number]  Systolic BP XX123456 AB-123456789 0000000 AB-123456789 AB-123456789 [id number] AB-[id number]  Diastolic BP 80 60 80 74 80 80 70  Wt. (Lbs) 166 168 169 166.04 168.12 167 162.04  BMI 31.37 31.56 29.94 29.42 29.79 29.59 28.71   Foot/eye exam completion dates 03/26/2016  Foot Form Completion Done

## 2016-10-10 NOTE — Assessment & Plan Note (Signed)
After obtaining informed consent, the vaccine is  administered by LPN.  

## 2016-10-10 NOTE — Assessment & Plan Note (Addendum)
Unchanged. Patient re-educated about  the importance of commitment to a  minimum of 150 minutes of exercise per week.  The importance of healthy food choices with portion control discussed. Encouraged to start a food diary, count calories and to consider  joining a support group. Sample diet sheets offered. Goals set by the patient for the next several months.   Weight /BMI 10/09/2016 05/02/2016 03/26/2016  WEIGHT 166 lb 168 lb 169 lb  HEIGHT 5\' 1"  5' 1.2" 5\' 3"   BMI 31.37 kg/m2 31.56 kg/m2 29.94 kg/m2

## 2016-10-10 NOTE — Assessment & Plan Note (Signed)
Controlled, no change in medication DASH diet and commitment to daily physical activity for a minimum of 30 minutes discussed and encouraged, as a part of hypertension management. The importance of attaining a healthy weight is also discussed.  BP/Weight 10/09/2016 05/02/2016 03/26/2016 10/10/2015 05/15/2015 12/14/2014 [id number]  Systolic BP XX123456 AB-123456789 0000000 AB-[id number] AB-123456789 [id number] AB-[id number]  Diastolic BP 80 60 80 74 80 80 70  Wt. (Lbs) 166 168 169 166.04 168.12 167 162.04  BMI 31.37 31.56 29.94 29.42 29.79 29.59 28.71

## 2016-10-27 ENCOUNTER — Other Ambulatory Visit: Payer: Self-pay | Admitting: Family Medicine

## 2017-01-27 ENCOUNTER — Telehealth: Payer: Self-pay | Admitting: Family Medicine

## 2017-01-27 ENCOUNTER — Other Ambulatory Visit: Payer: Self-pay

## 2017-01-27 NOTE — Telephone Encounter (Signed)
Kathleen Kirby is asking for a refill on her 2 blood pressure medicaitons and the 1 cholesterol medication, please advise?

## 2017-01-27 NOTE — Telephone Encounter (Signed)
Called and left message for patient to verify what pharmacy she would like refills sent to .

## 2017-01-28 ENCOUNTER — Other Ambulatory Visit: Payer: Self-pay

## 2017-01-28 MED ORDER — TRIAMTERENE-HCTZ 75-50 MG PO TABS: 1.0000 | ORAL_TABLET | Freq: Every day | ORAL | 1 refills | Status: DC

## 2017-01-28 MED ORDER — PRAVASTATIN SODIUM 10 MG PO TABS: 10.0000 mg | ORAL_TABLET | Freq: Every day | ORAL | 1 refills | Status: DC

## 2017-01-28 MED ORDER — AMLODIPINE BESYLATE 5 MG PO TABS: 5.0000 mg | ORAL_TABLET | Freq: Every day | ORAL | 1 refills | Status: DC

## 2017-01-28 NOTE — Telephone Encounter (Signed)
Meds refilled to mail order as requested

## 2017-04-10 ENCOUNTER — Telehealth: Payer: Self-pay

## 2017-04-10 DIAGNOSIS — I10 Essential (primary) hypertension: Secondary | ICD-10-CM

## 2017-04-10 DIAGNOSIS — E8881 Metabolic syndrome: Secondary | ICD-10-CM

## 2017-04-10 DIAGNOSIS — E785 Hyperlipidemia, unspecified: Secondary | ICD-10-CM

## 2017-04-10 LAB — COMPLETE METABOLIC PANEL WITH GFR
AG Ratio: 1.5 Ratio (ref 1.0–2.5)
ALT: 16 U/L (ref 6–29)
AST: 22 U/L (ref 10–35)
Albumin: 4 g/dL (ref 3.6–5.1)
Alkaline Phosphatase: 88 U/L (ref 33–130)
BUN/Creatinine Ratio: 17.6 Ratio (ref 6–22)
BUN: 12 mg/dL (ref 7–25)
CALCIUM: 9.4 mg/dL (ref 8.6–10.4)
CO2: 30 mmol/L (ref 20–31)
CREATININE: 0.68 mg/dL (ref 0.50–1.05)
Chloride: 102 mmol/L (ref 98–110)
GFR, Est Non African American: >89 mL/min (ref >=60)
Globulin: 2.7 g/dL (ref 1.9–3.7)
Glucose, Bld: 91 mg/dL (ref 65–99)
POTASSIUM: 3.9 mmol/L (ref 3.5–5.3)
SODIUM: 139 mmol/L (ref 135–146)
Total Bilirubin: 0.5 mg/dL (ref 0.2–1.2)
Total Protein: 6.7 g/dL (ref 6.1–8.1)

## 2017-04-10 LAB — LIPID PANEL
CHOL/HDL RATIO: 2.2 ratio (ref <5.0)
CHOLESTEROL: 196 mg/dL (ref <200)
HDL: 91 mg/dL (ref >50)
LDL Cholesterol: 97 mg/dL (ref <100)
TRIGLYCERIDES: 42 mg/dL (ref <150)
VLDL: 8 mg/dL (ref <30)

## 2017-04-10 NOTE — Telephone Encounter (Signed)
Labs ordered.

## 2017-04-11 LAB — HEMOGLOBIN A1C
HEMOGLOBIN A1C: 6.3 % — AB (ref <5.7)
MEAN PLASMA GLUCOSE: 134 mg/dL

## 2017-04-15 ENCOUNTER — Other Ambulatory Visit (HOSPITAL_COMMUNITY)
Admission: RE | Admit: 2017-04-15 | Discharge: 2017-04-15 | Disposition: A | Discharge status: Home / Self Care | Payer: Commercial Managed Care - PPO | Source: Ambulatory Visit | Attending: Family Medicine | Admitting: Family Medicine

## 2017-04-15 ENCOUNTER — Ambulatory Visit (INDEPENDENT_AMBULATORY_CARE_PROVIDER_SITE_OTHER): Payer: Commercial Managed Care - PPO | Admitting: Family Medicine

## 2017-04-15 ENCOUNTER — Encounter: Payer: Self-pay | Admitting: Family Medicine

## 2017-04-15 VITALS — BP 138/74 | HR 68 | Temp 97.1°F | Resp 16 | Ht 61.0 in | Wt 170.1 lb

## 2017-04-15 DIAGNOSIS — Z1211 Encounter for screening for malignant neoplasm of colon: Secondary | ICD-10-CM

## 2017-04-15 DIAGNOSIS — Z0000 Encounter for general adult medical examination without abnormal findings: Secondary | ICD-10-CM

## 2017-04-15 DIAGNOSIS — Z124 Encounter for screening for malignant neoplasm of cervix: Secondary | ICD-10-CM

## 2017-04-15 DIAGNOSIS — E8881 Metabolic syndrome: Secondary | ICD-10-CM

## 2017-04-15 DIAGNOSIS — I10 Essential (primary) hypertension: Secondary | ICD-10-CM | POA: Insufficient documentation

## 2017-04-15 DIAGNOSIS — E785 Hyperlipidemia, unspecified: Secondary | ICD-10-CM | POA: Diagnosis not present

## 2017-04-15 DIAGNOSIS — E119 Type 2 diabetes mellitus without complications: Secondary | ICD-10-CM | POA: Diagnosis not present

## 2017-04-15 LAB — POC HEMOCCULT BLD/STL (OFFICE/1-CARD/DIAGNOSTIC): FECAL OCCULT BLD: NEGATIVE

## 2017-04-15 NOTE — Progress Notes (Signed)
    Kathleen Kirby     MRN: [id number]      DOB: 18-Apr-1961  HPI: Patient is in for annual physical exam. No other health concerns are expressed or addressed at the visit. Recent labs, if available are reviewed. Immunization is reviewed , and  updated if needed.   PE: Pleasant  female, alert and oriented x 3, in no cardio-pulmonary distress. Afebrile. HEENT No facial trauma or asymetry. Sinuses non tender.  Extra occullar muscles intact, pupils equally reactive to light. External ears normal, tympanic membranes clear. Oropharynx moist, no exudate. Neck: supple, no adenopathy,JVD or thyromegaly.No bruits.  Chest: Clear to ascultation bilaterally.No crackles or wheezes. Non tender to palpation  Breast: No asymetry,no masses or lumps. No tenderness. No nipple discharge or inversion. No axillary or supraclavicular adenopathy  Cardiovascular system; Heart sounds normal,  S1 and  S2 ,no S3.  No murmur, or thrill. Apical beat not displaced Peripheral pulses normal.  Abdomen: Soft, non tender, no organomegaly or masses. No bruits. Bowel sounds normal. No guarding, tenderness or rebound.  Rectal:  Normal sphincter tone. No rectal mass. Guaiac negative stool.  GU: External genitalia normal female genitalia , normal female distribution of hair. No lesions. Urethral meatus normal in size, no  Prolapse, no lesions visibly  Present. Bladder non tender. Vagina pink and moist , with no visible lesions , discharge present . Adequate pelvic support no  cystocele or rectocele noted Cervix pink and appears healthy, no lesions or ulcerations noted, no discharge noted from os Uterus normal size, no adnexal masses, no cervical motion or adnexal tenderness.   Musculoskeletal exam: Full ROM of spine, hips , shoulders and knees. No deformity ,swelling or crepitus noted. No muscle wasting or atrophy.   Neurologic: Cranial nerves 2 to 12 intact. Power, tone ,sensation and reflexes  normal throughout. No disturbance in gait. No tremor.  Skin: Intact, no ulceration, erythema , scaling or rash noted. Pigmentation normal throughout  Psych; Normal mood and affect. Judgement and concentration normal   Assessment & Plan:  Annual physical exam Annual exam as documented. Counseling done  re healthy lifestyle involving commitment to 150 minutes exercise per week, heart healthy diet, and attaining healthy weight.The importance of adequate sleep also discussed.  Changes in health habits are decided on by the patient with goals and time frames  set for achieving them. Immunization and cancer screening needs are specifically addressed at this visit.

## 2017-04-15 NOTE — Patient Instructions (Addendum)
f/u end October/ early Novemebr, call if you need me before  Mammogram due in September please call in August and schedule this number to cal is [phone]  cBC, fnon fast, cmp and eGFR , hBA1c, cmp and EGFR, tSH and microalb Oct 25 or after  Pls schedule eye exam  Excellent labs and exam  No med changes  Please commit to daily exercise Please work on good  health habits so that your health will improve. 1. Commitment to daily physical activity for 30 to 60  minutes, if you are able to do this.  2. Commitment to wise food choices. Aim for half of your  food intake to be vegetable and fruit, one quarter starchy foods, and one quarter protein. Try to eat on a regular schedule  3 meals per day, snacking between meals should be limited to vegetables or fruits or small portions of nuts. 64 ounces of water per day is generally recommended, unless you have specific health conditions, like heart failure or kidney failure where you will need to limit fluid intake.  3. Commitment to sufficient and a  good quality of physical and mental rest daily, generally between 6 to 8 hours per day.  WITH PERSISTANCE AND PERSEVERANCE, THE IMPOSSIBLE , BECOMES THE NORM!  Thank you  for choosing [facility]. We consider it a privelige to serve you.  Delivering excellent health care in a caring and  compassionate way is our goal.  Partnering with you,  so that together we can achieve this goal is our strategy.

## 2017-04-17 NOTE — Assessment & Plan Note (Signed)
Annual exam as documented. Counseling done  re healthy lifestyle involving commitment to 150 minutes exercise per week, heart healthy diet, and attaining healthy weight.The importance of adequate sleep also discussed. Changes in health habits are decided on by the patient with goals and time frames  set for achieving them. Immunization and cancer screening needs are specifically addressed at this visit. 

## 2017-04-20 LAB — CYTOLOGY - PAP
DIAGNOSIS: NEGATIVE
HPV (WINDOPATH): DETECTED — AB

## 2017-05-04 ENCOUNTER — Telehealth: Payer: Self-pay | Admitting: Family Medicine

## 2017-05-04 NOTE — Telephone Encounter (Signed)
Patient left message on the answering machine that she was having some heel pain, she was requesting to come in Friday, I called and left her a voicemail that dr.simpson is not here Friday but to call us back so we can discuss details, and add her to cancellation list.

## 2017-05-06 NOTE — Telephone Encounter (Signed)
May work in on Thursday

## 2017-05-07 ENCOUNTER — Ambulatory Visit (INDEPENDENT_AMBULATORY_CARE_PROVIDER_SITE_OTHER): Payer: Commercial Managed Care - PPO | Admitting: Family Medicine

## 2017-05-07 ENCOUNTER — Encounter: Payer: Self-pay | Admitting: Family Medicine

## 2017-05-07 VITALS — BP 120/74 | HR 89 | Resp 16 | Ht 61.0 in | Wt 172.0 lb

## 2017-05-07 DIAGNOSIS — M722 Plantar fascial fibromatosis: Secondary | ICD-10-CM | POA: Insufficient documentation

## 2017-05-07 DIAGNOSIS — I10 Essential (primary) hypertension: Secondary | ICD-10-CM | POA: Diagnosis not present

## 2017-05-07 DIAGNOSIS — E119 Type 2 diabetes mellitus without complications: Secondary | ICD-10-CM | POA: Diagnosis not present

## 2017-05-07 DIAGNOSIS — R8789 Other abnormal findings in specimens from female genital organs: Secondary | ICD-10-CM

## 2017-05-07 DIAGNOSIS — R87618 Other abnormal cytological findings on specimens from cervix uteri: Secondary | ICD-10-CM

## 2017-05-07 MED ORDER — MELOXICAM 15 MG PO TABS: 15.0000 mg | ORAL_TABLET | Freq: Every day | ORAL | 0 refills | Status: DC

## 2017-05-07 MED ORDER — PREDNISONE 5 MG PO TABS: 5.0000 mg | ORAL_TABLET | Freq: Two times a day (BID) | ORAL | 0 refills | Status: AC

## 2017-05-07 NOTE — Assessment & Plan Note (Signed)
microalb sent

## 2017-05-07 NOTE — Patient Instructions (Addendum)
F/u as before. You are treated for Right plantar fascitis  Injections in office and medications sent to pharmacy     Plantar Fasciitis Rehab Ask your health care provider which exercises are safe for you. Do exercises exactly as told by your health care provider and adjust them as directed. It is normal to feel mild stretching, pulling, tightness, or discomfort as you do these exercises, but you should stop right away if you feel sudden pain or your pain gets worse. Do not begin these exercises until told by your health care provider. Stretching and range of motion exercises These exercises warm up your muscles and joints and improve the movement and flexibility of your foot. These exercises also help to relieve pain. Exercise A: Plantar fascia stretch   1. Sit with your left / right leg crossed over your opposite knee. 2. Hold your heel with one hand with that thumb near your arch. With your other hand, hold your toes and gently pull them back toward the top of your foot. You should feel a stretch on the bottom of your toes or your foot or both. 3. Hold this stretch for__________ seconds. 4. Slowly release your toes and return to the starting position. Repeat __________ times. Complete this exercise __________ times a day. Exercise B: Gastroc, standing   1. Stand with your hands against a wall. 2. Extend your left / right leg behind you, and bend your front knee slightly. 3. Keeping your heels on the floor and keeping your back knee straight, shift your weight toward the wall without arching your back. You should feel a gentle stretch in your left / right calf. 4. Hold this position for __________ seconds. Repeat __________ times. Complete this exercise __________ times a day. Exercise C: Soleus, standing  1. Stand with your hands against a wall. 2. Extend your left / right leg behind you, and bend your front knee slightly. 3. Keeping your heels on the floor, bend your back knee and  slightly shift your weight over the back leg. You should feel a gentle stretch deep in your calf. 4. Hold this position for __________ seconds. Repeat __________ times. Complete this exercise __________ times a day. Exercise D: Gastrocsoleus, standing  1. Stand with the ball of your left / right foot on a step. The ball of your foot is on the walking surface, right under your toes. 2. Keep your other foot firmly on the same step. 3. Hold onto the wall or a railing for balance. 4. Slowly lift your other foot, allowing your body weight to press your heel down over the edge of the step. You should feel a stretch in your left / right calf. 5. Hold this position for __________ seconds. 6. Return both feet to the step. 7. Repeat this exercise with a slight bend in your left / right knee. Repeat __________ times with your left / right knee straight and __________ times with your left / right knee bent. Complete this exercise __________ times a day. Balance exercise This exercise builds your balance and strength control of your arch to help take pressure off your plantar fascia. Exercise E: Single leg stand  1. Without shoes, stand near a railing or in a doorway. You may hold onto the railing or door frame as needed. 2. Stand on your left / right foot. Keep your big toe down on the floor and try to keep your arch lifted. Do not let your foot roll inward. 3. Hold this position  for __________ seconds. 4. If this exercise is too easy, you can try it with your eyes closed or while standing on a pillow. Repeat __________ times. Complete this exercise __________ times a day. This information is not intended to replace advice given to you by your health care provider. Make sure you discuss any questions you have with your health care provider. Document Released: 12/01/2005 Document Revised: 08/05/2016 Document Reviewed: 10/15/2015 Elsevier Interactive Patient Education  2017 [employer].

## 2017-05-07 NOTE — Assessment & Plan Note (Signed)
Uncontrolled.Toradol and depo medrol administered IM in the office , to be followed by a short course of oral prednisone and NSAIDS.  

## 2017-05-08 DIAGNOSIS — M722 Plantar fascial fibromatosis: Secondary | ICD-10-CM | POA: Diagnosis not present

## 2017-05-08 MED ORDER — METHYLPREDNISOLONE ACETATE 80 MG/ML IJ SUSP
80.0000 mg | Freq: Once | INTRAMUSCULAR | Status: AC
  Administered 2017-05-08: 80 mg via INTRAMUSCULAR

## 2017-05-08 MED ORDER — KETOROLAC TROMETHAMINE 60 MG/2ML IM SOLN
60.0000 mg | Freq: Once | INTRAMUSCULAR | Status: AC
  Administered 2017-05-08: 60 mg via INTRAMUSCULAR

## 2017-05-10 DIAGNOSIS — R8789 Other abnormal findings in specimens from female genital organs: Secondary | ICD-10-CM | POA: Insufficient documentation

## 2017-05-10 DIAGNOSIS — R87618 Other abnormal cytological findings on specimens from cervix uteri: Secondary | ICD-10-CM | POA: Insufficient documentation

## 2017-05-10 NOTE — Progress Notes (Signed)
   Kathleen Kirby     MRN: [id number]      DOB: 30-Jul-1961   HPI Kathleen Kirby is here with a 10 day h/o right heel pain worse on initially standing,  Lasts for  5 minutes, rated at a 10, has to stand and walk on the job, pain is worsening , requests treatment and a few days to recover and be able to return to work able to function unimpaired. No aggravating trauma, has had in the past, other foot affected reportedly. Has been icing foot at night , not much relief Recent pap report reviewed and need to rept pap in 1 year  ROS See HPI  Denies recent fever or chills. Denies sinus pressure, nasal congestion, ear pain or sore throat. Denies chest congestion, productive cough or wheezing. Denies chest pains, palpitations and leg swelling  Denies depression, anxiety or insomnia. Denies skin break down or rash.   PE  BP 120/74   Pulse 89   Resp 16   Ht 5\' 1"  (1.549 m)   Wt 172 lb (78 kg)   LMP 11/27/2012   SpO2 98%   BMI 32.50 kg/m  Pain on initial weight bearing with limp Patient alert and oriented and in no cardiopulmonary distress.  HEENT: No facial asymmetry, EOMI,   oropharynx pink and moist.  Neck supple no JVD, no mass.  Chest: Clear to auscultation bilaterally.  CVS: S1, S2 no murmurs, no S3.Regular rate.  ABD: Soft non tender.   Ext: No edema  MS: Adequate ROM spine, shoulders, hips and knees.Tender on palpation of right heel and instep  Skin: Intact, no ulcerations or rash noted.  CNS: CN 2-12 intact, power,  normal throughout.no focal deficits noted.   Assessment & Plan  Plantar fasciitis Uncontrolled.Toradol and depo medrol administered IM in the office , to be followed by a short course of oral prednisone and NSAIDS.   Type 2 diabetes, diet controlled ([place]) microalb sent  Essential hypertension Controlled, no change in medication DASH diet and commitment to daily physical activity for a minimum of 30 minutes discussed and encouraged, as a part of  hypertension management. The importance of attaining a healthy weight is also discussed.  BP/Weight 05/07/2017 04/15/2017 10/09/2016 05/02/2016 03/26/2016 10/10/2015 4/53/6468  Systolic BP 032 122 482 500 370 488 891  Diastolic BP 74 74 80 60 80 74 80  Wt. (Lbs) 172 170.12 166 168 169 166.04 168.12  BMI 32.5 32.14 31.37 31.56 29.94 29.42 29.79

## 2017-05-10 NOTE — Assessment & Plan Note (Signed)
Controlled, no change in medication DASH diet and commitment to daily physical activity for a minimum of 30 minutes discussed and encouraged, as a part of hypertension management. The importance of attaining a healthy weight is also discussed.  BP/Weight 05/07/2017 04/15/2017 10/09/2016 05/02/2016 03/26/2016 10/10/2015 06/01/4858  Systolic BP 276 [phone]  Diastolic BP 74 74 80 60 80 74 80  Wt. (Lbs) 172 170.12 166 168 169 166.04 168.12  BMI 32.5 32.14 31.37 31.56 29.94 29.42 29.79

## 2017-05-28 ENCOUNTER — Telehealth: Payer: Self-pay | Admitting: Pediatrics

## 2017-05-28 DIAGNOSIS — M722 Plantar fascial fibromatosis: Secondary | ICD-10-CM

## 2017-05-28 NOTE — Telephone Encounter (Signed)
Patient is requesting an appt, says she mowed the grass last week now her ears are clogged and shes having allergy issues. I dont have any availability.  Cb#: [phone] or [phone]  Send back to me if I can do anything

## 2017-05-29 NOTE — Telephone Encounter (Signed)
Sinus congestion and bad cough at night. No fever. Sinuses draining into her ears and they feel stopped up. Requesting medication before the weekend. (was willing to come in for appt)  Also her heel pain is some better not much. Anything else that can be tried or is a referral needed?

## 2017-05-29 NOTE — Telephone Encounter (Signed)
Left message on cell voicemail that she was referred to podiatry and to try the zyrtec over the weekend and call Monday for appt if no better

## 2017-06-19 ENCOUNTER — Other Ambulatory Visit: Payer: Self-pay | Admitting: Family Medicine

## 2017-06-26 ENCOUNTER — Telehealth: Payer: Self-pay | Admitting: Family Medicine

## 2017-06-26 ENCOUNTER — Other Ambulatory Visit: Payer: Self-pay | Admitting: Family Medicine

## 2017-06-26 NOTE — Telephone Encounter (Signed)
I spoke directly with the pt, she had got an additional dose of meloxicam, she now understands that tylenol is all she can take for pain other than prednisone

## 2017-06-26 NOTE — Telephone Encounter (Signed)
Follow up  Pt voiced she had allergic reaction to meloxicam 15 mg, including stiff neck, two sores on tongue, bruising easily and couldn't use right shoulder or right side.  Pt voiced wanting to speak to nurse in regard to another medication she can take in place of this medication.  Please f/u

## 2017-06-26 NOTE — Telephone Encounter (Signed)
Patient called and left a message on voice mail this morning asking that Dr. Griffin Dakin nurse return her call.  I tried to call her back to get more detail and go voice mail.  I left message asking her to call as again so that we may assist her.

## 2017-07-01 ENCOUNTER — Telehealth: Payer: Self-pay | Admitting: Family Medicine

## 2017-07-01 NOTE — Telephone Encounter (Signed)
Patient left message on voice mail this morning at 8:22.  The message was brief. Stated there was a problem w/her Rx.  I've made several attempt to reach her this morning on the number provided.  Left message for her to call back.

## 2017-07-02 NOTE — Telephone Encounter (Signed)
Called again this am and voicemail picked up and message was left to call back to the office

## 2017-07-02 NOTE — Telephone Encounter (Signed)
appt scheduled or will go to [facility] if symptoms worsen

## 2017-07-09 ENCOUNTER — Ambulatory Visit (INDEPENDENT_AMBULATORY_CARE_PROVIDER_SITE_OTHER): Payer: Commercial Managed Care - PPO | Admitting: Family Medicine

## 2017-07-09 ENCOUNTER — Encounter: Payer: Self-pay | Admitting: Family Medicine

## 2017-07-09 VITALS — BP 130/84 | HR 93 | Temp 98.1°F | Resp 16 | Ht 61.5 in | Wt 174.2 lb

## 2017-07-09 DIAGNOSIS — E785 Hyperlipidemia, unspecified: Secondary | ICD-10-CM | POA: Diagnosis not present

## 2017-07-09 DIAGNOSIS — F418 Other specified anxiety disorders: Secondary | ICD-10-CM

## 2017-07-09 DIAGNOSIS — I10 Essential (primary) hypertension: Secondary | ICD-10-CM | POA: Diagnosis not present

## 2017-07-09 DIAGNOSIS — E8881 Metabolic syndrome: Secondary | ICD-10-CM

## 2017-07-09 DIAGNOSIS — E119 Type 2 diabetes mellitus without complications: Secondary | ICD-10-CM

## 2017-07-09 LAB — POCT URINALYSIS DIPSTICK
BILIRUBIN UA: NEGATIVE
Glucose, UA: NEGATIVE
KETONES UA: NEGATIVE
LEUKOCYTES UA: NEGATIVE
NITRITE UA: NEGATIVE
PH UA: 6 (ref 5.0–8.0)
PROTEIN UA: NEGATIVE
Spec Grav, UA: <=1.005 — AB (ref 1.010–1.025)
Urobilinogen, UA: 0.2 E.U./dL

## 2017-07-09 LAB — GLUCOSE, POCT (MANUAL RESULT ENTRY): POC Glucose: 107 mg/dl — AB (ref 70–99)

## 2017-07-09 NOTE — Patient Instructions (Addendum)
F/u in novemebr as before , call if you need me sooner  Blood sugar is normal in office   CBC, lipid, cmp hBA1C  Thank you  for choosing [facility]. We consider it a privelige to serve you.  Delivering excellent health care in a caring and  compassionate way is our goal.  Partnering with you,  so that together we can achieve this goal is our strategy.   Thank you  for choosing [facility]. We consider it a privelige to serve you.  Delivering excellent health care in a caring and  compassionate way is our goal.  Partnering with you,  so that together we can achieve this goal is our strategy.

## 2017-07-09 NOTE — Progress Notes (Signed)
CHRYS LANDGREBE     MRN: [id number]      DOB: 27-Feb-1961   HPI Ms. Dobrowski is here stating that ever since she had a reaction to meloxicam with a sore tongue in mid July, she has not felt well, the tongur is no longer sore , she can eat and drink, she can now eat and drink, also reprots that she had loss of appetite , no energy   Feels sick,  Feels as though something is desperately wrong with her, very anxious. Finally near end of the visit , stated that she had unexpectedly lost her cousin's 44 y/o son to a brain aneurysm just as he was to graduate from college when her symptoms began  ROS See HPI  Denies recent fever . Denies sinus pressure, nasal congestion, ear pain or sore throat. Denies chest congestion, productive cough or wheezing. Denies chest pains, palpitations and leg swelling    Denies dysuria, frequency, hesitancy or incontinence. Denies joint pain, swelling and limitation in mobility.  Denies skin break down or rash.   PE  BP 130/84 (BP Location: Left Arm, Patient Position: Sitting, Cuff Size: Normal)   Pulse 93   Temp 98.1 F (36.7 C) (Other (Comment))   Resp 16   Ht 5' 1.5" (1.562 m)   Wt 174 lb 4 oz (79 kg)   LMP 11/27/2012   SpO2 99%   BMI 32.39 kg/m   Patient alert and oriented and in no cardiopulmonary distress. Extremely anxious also has flat/ depressed affect HEENT: No facial asymmetry, EOMI,   oropharynx pink and moist.  Neck supple no JVD, no mass.  Chest: Clear to auscultation bilaterally.  CVS: S1, S2 no murmurs, no S3.Regular rate.  ABD: Soft non tender.   Ext: No edema  MS: Adequate ROM spine, shoulders, hips and knees.  Skin: Intact, no ulcerations or rash noted.    CNS: CN 2-12 intact, power,  normal throughout.no focal deficits noted.   Assessment & Plan  Type 2 diabetes, diet controlled ([place]) Blood glucose in office checked and is excellent Patient educated about the importance of limiting  Carbohydrate intake , the need  to commit to daily physical activity for a minimum of 30 minutes , and to commit weight loss. The fact that changes in all these areas will reduce or eliminate all together the development of diabetes is stressed.  Updated lab needed at/ before next visit.   Diabetic Labs Latest Ref Rng & Units 07/09/2017 04/10/2017 10/04/2016 10/10/2015 10/05/2015  HbA1c <5.7 % 6.6(H) 6.3(H) 6.2(H) 6.7(H) 6.6(H)  Chol <200 mg/dL 205(H) 196 201(H) - 195  HDL >50 mg/dL 78 91 93 - 83  Calc LDL <100 mg/dL 119(H) 97 98 - 103  Triglycerides <150 mg/dL 40 42 51 - 44  Creatinine 0.50 - 1.05 mg/dL 0.75 0.68 0.70 0.76 0.65   BP/Weight 07/09/2017 05/07/2017 04/15/2017 10/09/2016 05/02/2016 03/26/2016 22/01/5426  Systolic BP 062 376 283 151 761 607 371  Diastolic BP 84 74 74 80 60 80 74  Wt. (Lbs) 174.25 172 170.12 166 168 169 166.04  BMI 32.39 32.5 32.14 31.37 31.56 29.94 29.42   Foot/eye exam completion dates 04/15/2017 03/26/2016  Foot Form Completion Done Done      Essential hypertension Controlled, no change in medication DASH diet and commitment to daily physical activity for a minimum of 30 minutes discussed and encouraged, as a part of hypertension management. The importance of attaining a healthy weight is also discussed.  BP/Weight 07/09/2017 05/07/2017 04/15/2017  10/09/2016 05/02/2016 03/26/2016 72/18/2883  Systolic BP 374 451 460 479 987 215 872  Diastolic BP 84 74 74 80 60 80 74  Wt. (Lbs) 174.25 172 170.12 166 168 169 166.04  BMI 32.39 32.5 32.14 31.37 31.56 29.94 29.42       Hyperlipidemia LDL goal <100 Hyperlipidemia:Low fat diet discussed and encouraged.   Lipid Panel  Lab Results  Component Value Date   CHOL 205 (H) 07/09/2017   HDL 78 07/09/2017   LDLCALC 119 (H) 07/09/2017   TRIG 40 07/09/2017   CHOLHDL 2.6 07/09/2017   Uncontrolled , needs to reduce fay intake Updated lab needed at/ before next visit.

## 2017-07-10 LAB — CBC
HEMATOCRIT: 37.6 % (ref 35.0–45.0)
HEMOGLOBIN: 12.2 g/dL (ref 11.7–15.5)
MCH: 25.1 pg — AB (ref 27.0–33.0)
MCHC: 32.4 g/dL (ref 32.0–36.0)
MCV: 77.2 fL — ABNORMAL LOW (ref 80.0–100.0)
MPV: 10 fL (ref 7.5–12.5)
Platelets: 381 10*3/uL (ref 140–400)
RBC: 4.87 MIL/uL (ref 3.80–5.10)
RDW: 13.9 % (ref 11.0–15.0)
WBC: 5.4 10*3/uL (ref 3.8–10.8)

## 2017-07-11 LAB — COMPLETE METABOLIC PANEL WITH GFR
ALBUMIN: 4 g/dL (ref 3.6–5.1)
ALK PHOS: 79 U/L (ref 33–130)
ALT: 12 U/L (ref 6–29)
AST: 17 U/L (ref 10–35)
BUN: 10 mg/dL (ref 7–25)
CALCIUM: 9.5 mg/dL (ref 8.6–10.4)
CHLORIDE: 99 mmol/L (ref 98–110)
CO2: 23 mmol/L (ref 20–31)
Creat: 0.75 mg/dL (ref 0.50–1.05)
Glucose, Bld: 121 mg/dL — ABNORMAL HIGH (ref 65–99)
POTASSIUM: 3.5 mmol/L (ref 3.5–5.3)
Sodium: 136 mmol/L (ref 135–146)
Total Bilirubin: 0.6 mg/dL (ref 0.2–1.2)
Total Protein: 7 g/dL (ref 6.1–8.1)

## 2017-07-11 LAB — LIPID PANEL
CHOL/HDL RATIO: 2.6 ratio (ref <5.0)
CHOLESTEROL: 205 mg/dL — AB (ref <200)
HDL: 78 mg/dL (ref >50)
LDL Cholesterol: 119 mg/dL — ABNORMAL HIGH (ref <100)
TRIGLYCERIDES: 40 mg/dL (ref <150)
VLDL: 8 mg/dL (ref <30)

## 2017-07-11 LAB — HEMOGLOBIN A1C
Hgb A1c MFr Bld: 6.6 % — ABNORMAL HIGH (ref <5.7)
MEAN PLASMA GLUCOSE: 143 mg/dL

## 2017-07-12 ENCOUNTER — Telehealth: Payer: Self-pay | Admitting: Family Medicine

## 2017-07-12 ENCOUNTER — Encounter: Payer: Self-pay | Admitting: Family Medicine

## 2017-07-12 NOTE — Telephone Encounter (Signed)
Pls see result note to pt. Needs mailed to her lab order to get for Nov visit , HBa1C, fasting lipid , cmp and GFr, thanks

## 2017-07-17 NOTE — Telephone Encounter (Signed)
DONE

## 2017-07-20 ENCOUNTER — Encounter: Payer: Self-pay | Admitting: Family Medicine

## 2017-07-20 DIAGNOSIS — F418 Other specified anxiety disorders: Secondary | ICD-10-CM

## 2017-07-20 DIAGNOSIS — R4589 Other symptoms and signs involving emotional state: Secondary | ICD-10-CM | POA: Insufficient documentation

## 2017-07-20 HISTORY — DX: Other specified anxiety disorders: F41.8

## 2017-07-20 HISTORY — DX: Other symptoms and signs involving emotional state: R45.89

## 2017-07-20 NOTE — Assessment & Plan Note (Signed)
Controlled, no change in medication DASH diet and commitment to daily physical activity for a minimum of 30 minutes discussed and encouraged, as a part of hypertension management. The importance of attaining a healthy weight is also discussed.  BP/Weight 07/09/2017 05/07/2017 04/15/2017 10/09/2016 05/02/2016 03/26/2016 03/50/0938  Systolic BP 182 993 716 967 893 810 175  Diastolic BP 84 74 74 80 60 80 74  Wt. (Lbs) 174.25 172 170.12 166 168 169 166.04  BMI 32.39 32.5 32.14 31.37 31.56 29.94 29.42

## 2017-07-20 NOTE — Assessment & Plan Note (Addendum)
Blood glucose in office checked and is excellent Patient educated about the importance of limiting  Carbohydrate intake , the need to commit to daily physical activity for a minimum of 30 minutes , and to commit weight loss. The fact that changes in all these areas will reduce or eliminate all together the development of diabetes is stressed.  Updated lab needed at/ before next visit.   Diabetic Labs Latest Ref Rng & Units 07/09/2017 04/10/2017 10/04/2016 10/10/2015 10/05/2015  HbA1c <5.7 % 6.6(H) 6.3(H) 6.2(H) 6.7(H) 6.6(H)  Chol <200 mg/dL 205(H) 196 201(H) - 195  HDL >50 mg/dL 78 91 93 - 83  Calc LDL <100 mg/dL 119(H) 97 98 - 103  Triglycerides <150 mg/dL 40 42 51 - 44  Creatinine 0.50 - 1.05 mg/dL 0.75 0.68 0.70 0.76 0.65   BP/Weight 07/09/2017 05/07/2017 04/15/2017 10/09/2016 05/02/2016 03/26/2016 72/89/7915  Systolic BP 041 364 383 779 396 886 484  Diastolic BP 84 74 74 80 60 80 74  Wt. (Lbs) 174.25 172 170.12 166 168 169 166.04  BMI 32.39 32.5 32.14 31.37 31.56 29.94 29.42   Foot/eye exam completion dates 04/15/2017 03/26/2016  Foot Form Completion Done Done

## 2017-07-20 NOTE — Assessment & Plan Note (Signed)
Hyperlipidemia:Low fat diet discussed and encouraged.   Lipid Panel  Lab Results  Component Value Date   CHOL 205 (H) 07/09/2017   HDL 78 07/09/2017   LDLCALC 119 (H) 07/09/2017   TRIG 40 07/09/2017   CHOLHDL 2.6 07/09/2017   Uncontrolled , needs to reduce fay intake Updated lab needed at/ before next visit.

## 2017-08-07 ENCOUNTER — Other Ambulatory Visit: Payer: Self-pay | Admitting: Family Medicine

## 2017-08-07 DIAGNOSIS — Z1231 Encounter for screening mammogram for malignant neoplasm of breast: Secondary | ICD-10-CM

## 2017-09-11 ENCOUNTER — Ambulatory Visit (HOSPITAL_COMMUNITY)
Admission: RE | Admit: 2017-09-11 | Discharge: 2017-09-11 | Disposition: A | Discharge status: Home / Self Care | Payer: Commercial Managed Care - PPO | Source: Ambulatory Visit | Attending: Family Medicine | Admitting: Family Medicine

## 2017-09-11 DIAGNOSIS — Z1231 Encounter for screening mammogram for malignant neoplasm of breast: Secondary | ICD-10-CM

## 2017-09-16 ENCOUNTER — Other Ambulatory Visit: Payer: Self-pay | Admitting: Family Medicine

## 2017-10-08 ENCOUNTER — Telehealth: Payer: Self-pay | Admitting: Family Medicine

## 2017-10-08 DIAGNOSIS — E785 Hyperlipidemia, unspecified: Secondary | ICD-10-CM

## 2017-10-08 DIAGNOSIS — E119 Type 2 diabetes mellitus without complications: Secondary | ICD-10-CM

## 2017-10-08 DIAGNOSIS — I10 Essential (primary) hypertension: Secondary | ICD-10-CM

## 2017-10-12 LAB — TSH: TSH: 0.53 mIU/L (ref 0.40–4.50)

## 2017-10-12 LAB — CBC
HCT: 37.4 % (ref 35.0–45.0)
Hemoglobin: 12.1 g/dL (ref 11.7–15.5)
MCH: 24.9 pg — ABNORMAL LOW (ref 27.0–33.0)
MCHC: 32.4 g/dL (ref 32.0–36.0)
MCV: 77 fL — ABNORMAL LOW (ref 80.0–100.0)
MPV: 11.7 fL (ref 7.5–12.5)
PLATELETS: 276 10*3/uL (ref 140–400)
RBC: 4.86 10*6/uL (ref 3.80–5.10)
RDW: 13.5 % (ref 11.0–15.0)
WBC: 4.1 10*3/uL (ref 3.8–10.8)

## 2017-10-12 LAB — COMPLETE METABOLIC PANEL WITH GFR
AG RATIO: 1.4 (calc) (ref 1.0–2.5)
ALT: 13 U/L (ref 6–29)
AST: 22 U/L (ref 10–35)
Albumin: 4 g/dL (ref 3.6–5.1)
Alkaline phosphatase (APISO): 75 U/L (ref 33–130)
BUN: 9 mg/dL (ref 7–25)
CALCIUM: 9.4 mg/dL (ref 8.6–10.4)
CO2: 29 mmol/L (ref 20–32)
Chloride: 100 mmol/L (ref 98–110)
Creat: 0.75 mg/dL (ref 0.50–1.05)
GFR, EST NON AFRICAN AMERICAN: 89 mL/min/{1.73_m2} (ref >=60)
GFR, Est African American: 103 mL/min/{1.73_m2} (ref >=60)
Globulin: 2.8 g/dL (calc) (ref 1.9–3.7)
Glucose, Bld: 101 mg/dL — ABNORMAL HIGH (ref 65–99)
POTASSIUM: 3.5 mmol/L (ref 3.5–5.3)
Sodium: 137 mmol/L (ref 135–146)
Total Bilirubin: 0.4 mg/dL (ref 0.2–1.2)
Total Protein: 6.8 g/dL (ref 6.1–8.1)

## 2017-10-12 LAB — HEMOGLOBIN A1C
EAG (MMOL/L): 7.9 (calc)
Hgb A1c MFr Bld: 6.6 % of total Hgb — ABNORMAL HIGH (ref <5.7)
MEAN PLASMA GLUCOSE: 143 (calc)

## 2017-10-12 LAB — MICROALBUMIN / CREATININE URINE RATIO: CREATININE, URINE: 10 mg/dL — AB (ref 20–275)

## 2017-10-15 ENCOUNTER — Ambulatory Visit (INDEPENDENT_AMBULATORY_CARE_PROVIDER_SITE_OTHER): Payer: Commercial Managed Care - PPO | Admitting: Family Medicine

## 2017-10-15 ENCOUNTER — Encounter: Payer: Self-pay | Admitting: Family Medicine

## 2017-10-15 VITALS — BP 130/70 | HR 80 | Resp 16 | Ht 62.0 in | Wt 164.0 lb

## 2017-10-15 DIAGNOSIS — E669 Obesity, unspecified: Secondary | ICD-10-CM | POA: Diagnosis not present

## 2017-10-15 DIAGNOSIS — E785 Hyperlipidemia, unspecified: Secondary | ICD-10-CM | POA: Diagnosis not present

## 2017-10-15 DIAGNOSIS — Z23 Encounter for immunization: Secondary | ICD-10-CM

## 2017-10-15 DIAGNOSIS — I10 Essential (primary) hypertension: Secondary | ICD-10-CM | POA: Diagnosis not present

## 2017-10-15 DIAGNOSIS — E119 Type 2 diabetes mellitus without complications: Secondary | ICD-10-CM | POA: Diagnosis not present

## 2017-10-15 DIAGNOSIS — E8881 Metabolic syndrome: Secondary | ICD-10-CM | POA: Diagnosis not present

## 2017-10-15 NOTE — Patient Instructions (Addendum)
Physical exam May 3 or after , call if you need me sooner  Fasting lipid, cmp and EGFr and hBA1C in 5.5 months, collect sheet at checkout please   Flu vaccine today  Congrats on weight loss and healthy habits, please cut down on fats and cheese  Please schedule your eye exam  Thanks for choosing [facility], we consider it a privelige to serve you.   It is important that you exercise regularly at least 30 minutes 5 times a week. If you develop chest pain, have severe difficulty breathing, or feel very tired, stop exercising immediately and seek medical attention

## 2017-10-18 ENCOUNTER — Encounter: Payer: Self-pay | Admitting: Family Medicine

## 2017-10-18 NOTE — Assessment & Plan Note (Signed)
The increased risk of cardiovascular disease associated with this diagnosis, and the need to consistently work on lifestyle to change this is discussed. Following  a  heart healthy diet ,commitment to 30 minutes of exercise at least 5 days per week, as well as control of blood sugar and cholesterol , and achieving a healthy weight are all the areas to be addressed .  

## 2017-10-18 NOTE — Progress Notes (Signed)
Kathleen Kirby     MRN: [id number]      DOB: 07-13-1961   HPI Kathleen Kirby is here for follow up and re-evaluation of chronic medical conditions, medication management and review of any available recent lab and radiology data.  Preventive health is updated, specifically  Cancer screening and Immunization.   Questions or concerns regarding consultations or procedures which the PT has had in the interim are  addressed. The PT denies any adverse reactions to current medications since the last visit.  There are no new concerns.  There are no specific complaints   ROS Denies recent fever or chills. Denies sinus pressure, nasal congestion, ear pain or sore throat. Denies chest congestion, productive cough or wheezing. Denies chest pains, palpitations and leg swelling Denies abdominal pain, nausea, vomiting,diarrhea or constipation.   Denies dysuria, frequency, hesitancy or incontinence. Denies joint pain, swelling and limitation in mobility. Denies headaches, seizures, numbness, or tingling. Denies depression, anxiety or insomnia. Denies skin break down or rash.   PE  BP 130/70   Pulse 80   Resp 16   Ht 5\' 2"  (1.575 m)   Wt 164 lb (74.4 kg)   LMP 11/27/2012   SpO2 98%   BMI 30.00 kg/m   Patient alert and oriented and in no cardiopulmonary distress.  HEENT: No facial asymmetry, EOMI,   oropharynx pink and moist.  Neck supple no JVD, no mass.  Chest: Clear to auscultation bilaterally.  CVS: S1, S2 no murmurs, no S3.Regular rate.  ABD: Soft non tender.   Ext: No edema  MS: Adequate ROM spine, shoulders, hips and knees.  Skin: Intact, no ulcerations or rash noted.  Psych: Good eye contact, normal affect. Memory intact not anxious or depressed appearing.  CNS: CN 2-12 intact, power,  normal throughout.no focal deficits noted.   Assessment & Plan  Essential hypertension Controlled, no change in medication DASH diet and commitment to daily physical activity for a  minimum of 30 minutes discussed and encouraged, as a part of hypertension management. The importance of attaining a healthy weight is also discussed.  BP/Weight 10/15/2017 07/09/2017 05/07/2017 04/15/2017 10/09/2016 05/02/2016 9/47/0962  Systolic BP 836 629 476 546 503 546 568  Diastolic BP 70 84 74 74 80 60 80  Wt. (Lbs) 164 174.25 172 170.12 166 168 169  BMI 30 32.39 32.5 32.14 31.37 31.56 29.94       Hyperlipidemia LDL goal <100 Hyperlipidemia:Low fat diet discussed and encouraged.   Lipid Panel  Lab Results  Component Value Date   CHOL 205 (H) 07/09/2017   HDL 78 07/09/2017   LDLCALC 119 (H) 07/09/2017   TRIG 40 07/09/2017   CHOLHDL 2.6 07/09/2017   Not at goal , needs to reduce fat intake , no change in management   Type 2 diabetes, diet controlled ([place]) Kathleen Kirby is reminded of the importance of commitment to daily physical activity for 30 minutes or more, as able and the need to limit carbohydrate intake to 30 to 60 grams per meal to help with blood sugar control.   Kathleen Kirby is reminded of the importance of daily foot exam, annual eye examination, and good blood sugar, blood pressure and cholesterol control. Unchnaged Diabetic Labs Latest Ref Rng & Units 10/10/2017 07/09/2017 04/10/2017 10/04/2016 10/10/2015  HbA1c <5.7 % of total Hgb 6.6(H) 6.6(H) 6.3(H) 6.2(H) 6.7(H)  Microalbumin mg/dL <0.2 - - - -  Micro/Creat Ratio <30 mcg/mg creat NOTE - - - -  Chol <200 mg/dL - 205(H)  196 201(H) -  HDL >50 mg/dL - 78 91 93 -  Calc LDL <100 mg/dL - 119(H) 97 98 -  Triglycerides <150 mg/dL - 40 42 51 -  Creatinine 0.50 - 1.05 mg/dL 0.75 0.75 0.68 0.70 0.76   BP/Weight 10/15/2017 07/09/2017 05/07/2017 04/15/2017 10/09/2016 05/02/2016 6/97/9480  Systolic BP 165 537 482 707 867 544 920  Diastolic BP 70 84 74 74 80 60 80  Wt. (Lbs) 164 174.25 172 170.12 166 168 169  BMI 30 32.39 32.5 32.14 31.37 31.56 29.94   Foot/eye exam completion dates 04/15/2017 03/26/2016  Foot Form Completion Done  Done        Metabolic syndrome X The increased risk of cardiovascular disease associated with this diagnosis, and the need to consistently work on lifestyle to change this is discussed. Following  a  heart healthy diet ,commitment to 30 minutes of exercise at least 5 days per week, as well as control of blood sugar and cholesterol , and achieving a healthy weight are all the areas to be addressed .   Obesity (BMI 30.0-34.9) Improved. Patient re-educated about  the importance of commitment to a  minimum of 150 minutes of exercise per week.  The importance of healthy food choices with portion control discussed. Encouraged to start a food diary, count calories and to consider  joining a support group. Sample diet sheets offered. Goals set by the patient for the next several months.   Weight /BMI 10/15/2017 07/09/2017 05/07/2017  WEIGHT 164 lb 174 lb 4 oz 172 lb  HEIGHT 5\' 2"  5' 1.5" 5\' 1"   BMI 30 kg/m2 32.39 kg/m2 32.5 kg/m2

## 2017-10-18 NOTE — Assessment & Plan Note (Signed)
Hyperlipidemia:Low fat diet discussed and encouraged.   Lipid Panel  Lab Results  Component Value Date   CHOL 205 (H) 07/09/2017   HDL 78 07/09/2017   LDLCALC 119 (H) 07/09/2017   TRIG 40 07/09/2017   CHOLHDL 2.6 07/09/2017   Not at goal , needs to reduce fat intake , no change in management

## 2017-10-18 NOTE — Assessment & Plan Note (Signed)
Controlled, no change in medication DASH diet and commitment to daily physical activity for a minimum of 30 minutes discussed and encouraged, as a part of hypertension management. The importance of attaining a healthy weight is also discussed.  BP/Weight 10/15/2017 07/09/2017 05/07/2017 04/15/2017 10/09/2016 05/02/2016 3/50/0938  Systolic BP 182 993 716 967 893 810 175  Diastolic BP 70 84 74 74 80 60 80  Wt. (Lbs) 164 174.25 172 170.12 166 168 169  BMI 30 32.39 32.5 32.14 31.37 31.56 29.94

## 2017-10-18 NOTE — Assessment & Plan Note (Signed)
Kathleen Kirby is reminded of the importance of commitment to daily physical activity for 30 minutes or more, as able and the need to limit carbohydrate intake to 30 to 60 grams per meal to help with blood sugar control.   Kathleen Kirby is reminded of the importance of daily foot exam, annual eye examination, and good blood sugar, blood pressure and cholesterol control. Unchnaged Diabetic Labs Latest Ref Rng & Units 10/10/2017 07/09/2017 04/10/2017 10/04/2016 10/10/2015  HbA1c <5.7 % of total Hgb 6.6(H) 6.6(H) 6.3(H) 6.2(H) 6.7(H)  Microalbumin mg/dL <0.2 - - - -  Micro/Creat Ratio <30 mcg/mg creat NOTE - - - -  Chol <200 mg/dL - 205(H) 196 201(H) -  HDL >50 mg/dL - 78 91 93 -  Calc LDL <100 mg/dL - 119(H) 97 98 -  Triglycerides <150 mg/dL - 40 42 51 -  Creatinine 0.50 - 1.05 mg/dL 0.75 0.75 0.68 0.70 0.76   BP/Weight 10/15/2017 07/09/2017 05/07/2017 04/15/2017 10/09/2016 05/02/2016 9/38/1017  Systolic BP 510 258 527 782 423 536 144  Diastolic BP 70 84 74 74 80 60 80  Wt. (Lbs) 164 174.25 172 170.12 166 168 169  BMI 30 32.39 32.5 32.14 31.37 31.56 29.94   Foot/eye exam completion dates 04/15/2017 03/26/2016  Foot Form Completion Done Done

## 2017-10-18 NOTE — Assessment & Plan Note (Signed)
Improved. Patient re-educated about  the importance of commitment to a  minimum of 150 minutes of exercise per week.  The importance of healthy food choices with portion control discussed. Encouraged to start a food diary, count calories and to consider  joining a support group. Sample diet sheets offered. Goals set by the patient for the next several months.   Weight /BMI 10/15/2017 07/09/2017 05/07/2017  WEIGHT 164 lb 174 lb 4 oz 172 lb  HEIGHT 5\' 2"  5' 1.5" 5\' 1"   BMI 30 kg/m2 32.39 kg/m2 32.5 kg/m2

## 2018-03-18 ENCOUNTER — Telehealth: Payer: Self-pay | Admitting: Family Medicine

## 2018-03-18 NOTE — Telephone Encounter (Signed)
Patient left vm returning call to r/s appt. I called back [phone] and [phone] as requested. Both had busy tones 2x each.

## 2018-03-22 ENCOUNTER — Other Ambulatory Visit: Payer: Self-pay | Admitting: Family Medicine

## 2018-04-27 ENCOUNTER — Ambulatory Visit: Payer: Commercial Managed Care - PPO | Admitting: Family Medicine

## 2018-05-14 ENCOUNTER — Telehealth: Payer: Self-pay

## 2018-05-14 DIAGNOSIS — I10 Essential (primary) hypertension: Secondary | ICD-10-CM

## 2018-05-14 DIAGNOSIS — E785 Hyperlipidemia, unspecified: Secondary | ICD-10-CM

## 2018-05-14 DIAGNOSIS — E119 Type 2 diabetes mellitus without complications: Secondary | ICD-10-CM

## 2018-05-14 NOTE — Telephone Encounter (Signed)
Labs ordered.

## 2018-05-15 ENCOUNTER — Encounter: Payer: Self-pay | Admitting: Family Medicine

## 2018-05-15 LAB — HEMOGLOBIN A1C
HEMOGLOBIN A1C: 6.7 %{Hb} — AB (ref <5.7)
Mean Plasma Glucose: 146 (calc)
eAG (mmol/L): 8.1 (calc)

## 2018-05-15 LAB — LIPID PANEL
CHOLESTEROL: 207 mg/dL — AB (ref <200)
HDL: 87 mg/dL (ref >50)
LDL Cholesterol (Calc): 109 mg/dL (calc) — ABNORMAL HIGH
Non-HDL Cholesterol (Calc): 120 mg/dL (calc) (ref <130)
Total CHOL/HDL Ratio: 2.4 (calc) (ref <5.0)
Triglycerides: 39 mg/dL (ref <150)

## 2018-05-15 LAB — COMPLETE METABOLIC PANEL WITH GFR
AG Ratio: 1.5 (calc) (ref 1.0–2.5)
ALBUMIN MSPROF: 4.1 g/dL (ref 3.6–5.1)
ALT: 11 U/L (ref 6–29)
AST: 17 U/L (ref 10–35)
Alkaline phosphatase (APISO): 84 U/L (ref 33–130)
BILIRUBIN TOTAL: 0.5 mg/dL (ref 0.2–1.2)
BUN: 11 mg/dL (ref 7–25)
CHLORIDE: 100 mmol/L (ref 98–110)
CO2: 29 mmol/L (ref 20–32)
Calcium: 9.9 mg/dL (ref 8.6–10.4)
Creat: 0.78 mg/dL (ref 0.50–1.05)
GFR, Est African American: 98 mL/min/{1.73_m2} (ref >=60)
GFR, Est Non African American: 85 mL/min/{1.73_m2} (ref >=60)
GLOBULIN: 2.7 g/dL (ref 1.9–3.7)
GLUCOSE: 98 mg/dL (ref 65–99)
Potassium: 3.8 mmol/L (ref 3.5–5.3)
SODIUM: 138 mmol/L (ref 135–146)
Total Protein: 6.8 g/dL (ref 6.1–8.1)

## 2018-05-20 ENCOUNTER — Encounter: Payer: Self-pay | Admitting: Family Medicine

## 2018-05-20 ENCOUNTER — Other Ambulatory Visit (HOSPITAL_COMMUNITY)
Admission: RE | Admit: 2018-05-20 | Discharge: 2018-05-20 | Disposition: A | Discharge status: Home / Self Care | Payer: Commercial Managed Care - PPO | Source: Ambulatory Visit | Attending: Family Medicine | Admitting: Family Medicine

## 2018-05-20 ENCOUNTER — Ambulatory Visit (INDEPENDENT_AMBULATORY_CARE_PROVIDER_SITE_OTHER): Payer: Commercial Managed Care - PPO | Admitting: Family Medicine

## 2018-05-20 VITALS — BP 118/80 | HR 84 | Resp 16 | Ht 62.0 in | Wt 164.0 lb

## 2018-05-20 DIAGNOSIS — Z1211 Encounter for screening for malignant neoplasm of colon: Secondary | ICD-10-CM

## 2018-05-20 DIAGNOSIS — E669 Obesity, unspecified: Secondary | ICD-10-CM | POA: Diagnosis not present

## 2018-05-20 DIAGNOSIS — Z0000 Encounter for general adult medical examination without abnormal findings: Secondary | ICD-10-CM | POA: Diagnosis not present

## 2018-05-20 DIAGNOSIS — Z124 Encounter for screening for malignant neoplasm of cervix: Secondary | ICD-10-CM | POA: Insufficient documentation

## 2018-05-20 DIAGNOSIS — E119 Type 2 diabetes mellitus without complications: Secondary | ICD-10-CM | POA: Diagnosis not present

## 2018-05-20 MED ORDER — PRAVASTATIN SODIUM 10 MG PO TABS: 10.0000 mg | ORAL_TABLET | Freq: Every day | ORAL | 1 refills | Status: DC

## 2018-05-20 MED ORDER — TRIAMTERENE-HCTZ 75-50 MG PO TABS: 1.0000 | ORAL_TABLET | Freq: Every day | ORAL | 1 refills | Status: DC

## 2018-05-20 MED ORDER — AMLODIPINE BESYLATE 5 MG PO TABS: 5.0000 mg | ORAL_TABLET | Freq: Every day | ORAL | 1 refills | Status: DC

## 2018-05-20 NOTE — Assessment & Plan Note (Signed)
Annual exam as documented. Counseling done  re healthy lifestyle involving commitment to 150 minutes exercise per week, heart healthy diet, and attaining healthy weight.The importance of adequate sleep also discussed. Changes in health habits are decided on by the patient with goals and time frames  set for achieving them. Immunization and cancer screening needs are specifically addressed at this visit. 

## 2018-05-20 NOTE — Patient Instructions (Addendum)
F/U early December, call if you need me before  Colo guard test from home    CBC, TSH, hBA1c,, fasting lipid, cmp and eGFR microalb and vit Dmid to  last week in Novemebr Thankful you feel well and are doing well, we will contact you re pap result  Please work on good  health habits so that your health will improve. 1. Commitment to daily physical activity for 30 to 60  minutes, if you are able to do this.  2. Commitment to wise food choices. Aim for half of your  food intake to be vegetable and fruit, one quarter starchy foods, and one quarter protein. Try to eat on a regular schedule  3 meals per day, snacking between meals should be limited to vegetables or fruits or small portions of nuts. 64 ounces of water per day is generally recommended, unless you have specific health conditions, like heart failure or kidney failure where you will need to limit fluid intake.  3. Commitment to sufficient and a  good quality of physical and mental rest daily, generally between 6 to 8 hours per day.  WITH PERSISTANCE AND PERSEVERANCE, THE IMPOSSIBLE , BECOMES THE NORM! It is important that you exercise regularly at least 30 minutes 5 times a week. If you develop chest pain, have severe difficulty breathing, or feel very tired, stop exercising immediately and seek medical attention

## 2018-05-20 NOTE — Progress Notes (Signed)
    Kathleen Kirby     MRN: [id number]      DOB: [birth year]  HPI: Patient is in for annual physical exam. No other health concerns are expressed or addressed at the visit. Recent labs, are reviewed. Immunization is reviewed , and  updated if needed.   PE: BP 118/80   Pulse 84   Resp 16   Ht 5\' 2"  (1.575 m)   Wt 164 lb (74.4 kg)   LMP 11/27/2012   SpO2 98%   BMI 30.00 kg/m   Pleasant  female, alert and oriented x 3, in no cardio-pulmonary distress. Afebrile. HEENT No facial trauma or asymetry. Sinuses non tender.  Extra occullar muscles intact, pupils equally reactive to light. External ears normal, tympanic membranes clear. Oropharynx moist, no exudate. Neck: supple, no adenopathy,JVD or thyromegaly.No bruits.  Chest: Clear to ascultation bilaterally.No crackles or wheezes. Non tender to palpation  Breast: No asymetry,no masses or lumps. No tenderness. No nipple discharge or inversion. No axillary or supraclavicular adenopathy  Cardiovascular system; Heart sounds normal,  S1 and  S2 ,no S3.  No murmur, or thrill. Apical beat not displaced Peripheral pulses normal.  Abdomen: Soft, non tender, no organomegaly or masses. No bruits. Bowel sounds normal. No guarding, tenderness or rebound.  Rectal:  Normal sphincter tone. No rectal mass. Guaiac negative stool.  GU: External genitalia normal female genitalia , normal female distribution of hair. No lesions. Urethral meatus normal in size, no  Prolapse, no lesions visibly  Present. Bladder non tender. Vagina pink and moist , with no visible lesions , discharge present . Adequate pelvic support no  cystocele or rectocele noted Cervix pink and appears healthy, no lesions or ulcerations noted, no discharge noted from os Uterus normal size, no adnexal masses, no cervical motion or adnexal tenderness.   Musculoskeletal exam: Full ROM of spine, hips , shoulders and knees. No deformity ,swelling or crepitus  noted. No muscle wasting or atrophy.   Neurologic: Cranial nerves 2 to 12 intact. Power, tone ,sensation and reflexes normal throughout. No disturbance in gait. No tremor.  Skin: Intact, no ulceration, erythema , scaling or rash noted. Pigmentation normal throughout  Psych; Normal mood and affect. Judgement and concentration normal   Assessment & Plan:  Annual physical exam Annual exam as documented. Counseling done  re healthy lifestyle involving commitment to 150 minutes exercise per week, heart healthy diet, and attaining healthy weight.The importance of adequate sleep also discussed.  Changes in health habits are decided on by the patient with goals and time frames  set for achieving them. Immunization and cancer screening needs are specifically addressed at this visit.   Type 2 diabetes, diet controlled (HCC) Controlled, no change in management  Obesity (BMI 30.0-34.9) Unchanged Patient re-educated about  the importance of commitment to a  minimum of 150 minutes of exercise per week.  The importance of healthy food choices with portion control discussed. Encouraged to start a food diary, count calories and to consider  joining a support group. Sample diet sheets offered. Goals set by the patient for the next several months.   Weight /BMI 05/20/2018 10/15/2017 07/09/2017  WEIGHT 164 lb 164 lb 174 lb 4 oz  HEIGHT 5\' 2"  5\' 2"  5' 1.5"  BMI 30 kg/m2 30 kg/m2 32.39 kg/m2

## 2018-05-21 NOTE — Assessment & Plan Note (Addendum)
Unchanged Patient re-educated about  the importance of commitment to a  minimum of 150 minutes of exercise per week.  The importance of healthy food choices with portion control discussed. Encouraged to start a food diary, count calories and to consider  joining a support group. Sample diet sheets offered. Goals set by the patient for the next several months.   Weight /BMI 05/20/2018 10/15/2017 07/09/2017  WEIGHT 164 lb 164 lb 174 lb 4 oz  HEIGHT 5\' 2"  5\' 2"  5' 1.5"  BMI 30 kg/m2 30 kg/m2 32.39 kg/m2

## 2018-05-21 NOTE — Assessment & Plan Note (Signed)
Controlled , no change in management 

## 2018-05-26 LAB — CYTOLOGY - PAP
DIAGNOSIS: NEGATIVE
HPV (WINDOPATH): DETECTED — AB

## 2018-06-04 ENCOUNTER — Telehealth: Payer: Self-pay | Admitting: Family Medicine

## 2018-06-04 NOTE — Telephone Encounter (Signed)
Patient lvm returning call, requesting call back to # [phone]/ [phone]

## 2018-06-04 NOTE — Telephone Encounter (Signed)
Left message requesting call back to discuss recent lab results.

## 2018-06-04 NOTE — Telephone Encounter (Signed)
Patient aware.

## 2018-06-04 NOTE — Telephone Encounter (Signed)
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

## 2018-06-10 LAB — COLOGUARD: Cologuard: NEGATIVE

## 2018-08-05 ENCOUNTER — Other Ambulatory Visit: Payer: Self-pay | Admitting: Family Medicine

## 2018-08-05 DIAGNOSIS — Z1231 Encounter for screening mammogram for malignant neoplasm of breast: Secondary | ICD-10-CM

## 2018-09-17 ENCOUNTER — Ambulatory Visit (HOSPITAL_COMMUNITY)
Admission: RE | Admit: 2018-09-17 | Discharge: 2018-09-17 | Disposition: A | Discharge status: Home / Self Care | Payer: Commercial Managed Care - PPO | Source: Ambulatory Visit | Attending: Family Medicine | Admitting: Family Medicine

## 2018-09-17 DIAGNOSIS — Z1231 Encounter for screening mammogram for malignant neoplasm of breast: Secondary | ICD-10-CM | POA: Diagnosis not present

## 2018-09-26 ENCOUNTER — Other Ambulatory Visit: Payer: Self-pay | Admitting: Family Medicine

## 2018-11-11 ENCOUNTER — Other Ambulatory Visit: Payer: Self-pay | Admitting: Family Medicine

## 2018-11-18 ENCOUNTER — Encounter: Payer: Self-pay | Admitting: Family Medicine

## 2018-11-18 ENCOUNTER — Ambulatory Visit (INDEPENDENT_AMBULATORY_CARE_PROVIDER_SITE_OTHER): Payer: Commercial Managed Care - PPO | Admitting: Family Medicine

## 2018-11-18 VITALS — BP 148/76 | HR 70 | Resp 12 | Ht 61.5 in | Wt 169.0 lb

## 2018-11-18 DIAGNOSIS — I10 Essential (primary) hypertension: Secondary | ICD-10-CM | POA: Diagnosis not present

## 2018-11-18 DIAGNOSIS — Z23 Encounter for immunization: Secondary | ICD-10-CM | POA: Diagnosis not present

## 2018-11-18 DIAGNOSIS — B369 Superficial mycosis, unspecified: Secondary | ICD-10-CM

## 2018-11-18 DIAGNOSIS — E785 Hyperlipidemia, unspecified: Secondary | ICD-10-CM | POA: Diagnosis not present

## 2018-11-18 DIAGNOSIS — E669 Obesity, unspecified: Secondary | ICD-10-CM | POA: Diagnosis not present

## 2018-11-18 DIAGNOSIS — E119 Type 2 diabetes mellitus without complications: Secondary | ICD-10-CM

## 2018-11-18 MED ORDER — CLOTRIMAZOLE-BETAMETHASONE 1-0.05 % EX CREA: TOPICAL_CREAM | CUTANEOUS | 0 refills | Status: DC

## 2018-11-18 NOTE — Patient Instructions (Addendum)
F/U in 4.5 months, call if you need me before  CBC, fasting lipid, cmp and EGFR, HBA1C, TSH and Vit D  By 12/14/2018  For rash on arm, I have prescribed cream for twice daily use for 1 5 to 7 days , then as needed  It is important that you exercise regularly at least 30 minutes 5 times a week. If you develop chest pain, have severe difficulty breathing, or feel very tired, stop exercising immediately and seek medical attention   Please work on good  health habits so that your health will improve. 1. Commitment to daily physical activity for 30 to 60  minutes, if you are able to do this.  2. Commitment to wise food choices. Aim for half of your  food intake to be vegetable and fruit, one quarter starchy foods, and one quarter protein. Try to eat on a regular schedule  3 meals per day, snacking between meals should be limited to vegetables or fruits or small portions of nuts. 64 ounces of water per day is generally recommended, unless you have specific health conditions, like heart failure or kidney failure where you will need to limit fluid intake.  3. Commitment to sufficient and a  good quality of physical and mental rest daily, generally between 6 to 8 hours per day.  WITH PERSISTANCE AND PERSEVERANCE, THE IMPOSSIBLE , BECOMES THE NORM!

## 2018-11-27 ENCOUNTER — Encounter: Payer: Self-pay | Admitting: Family Medicine

## 2018-11-27 DIAGNOSIS — B369 Superficial mycosis, unspecified: Secondary | ICD-10-CM | POA: Insufficient documentation

## 2018-11-27 NOTE — Assessment & Plan Note (Addendum)
UnControlled, no change in medication, has been off medication x several days DASH diet and commitment to daily physical activity for a minimum of 30 minutes discussed and encouraged, as a part of hypertension management. The importance of attaining a healthy weight is also discussed.  BP/Weight 11/18/2018 05/20/2018 10/15/2017 07/09/2017 05/07/2017 04/15/2017 03/40/3524  Systolic BP 818 590 931 121 624 469 507  Diastolic BP 76 80 70 84 74 74 80  Wt. (Lbs) 169.04 164 164 174.25 172 170.12 166  BMI 31.42 30 30 32.39 32.5 32.14 31.37

## 2018-11-27 NOTE — Assessment & Plan Note (Signed)
Topical clotrimazole/betamethasone prescribed ffor 5 days, then as needed

## 2018-11-27 NOTE — Assessment & Plan Note (Signed)
Kathleen Kirby is reminded of the importance of commitment to daily physical activity for 30 minutes or more, as able and the need to limit carbohydrate intake to 30 to 60 grams per meal to help with blood sugar control.    Kathleen Kirby is reminded of the importance of daily foot exam, annual eye examination, and good blood sugar, blood pressure and cholesterol control.  Diabetic Labs Latest Ref Rng & Units 05/14/2018 10/10/2017 07/09/2017 04/10/2017 10/04/2016  HbA1c <5.7 % of total Hgb 6.7(H) 6.6(H) 6.6(H) 6.3(H) 6.2(H)  Microalbumin mg/dL - <0.2 - - -  Micro/Creat Ratio <30 mcg/mg creat - NOTE - - -  Chol <200 mg/dL 207(H) - 205(H) 196 201(H)  HDL >50 mg/dL 87 - 78 91 93  Calc LDL mg/dL (calc) 109(H) - 119(H) 97 98  Triglycerides <150 mg/dL 39 - 40 42 51  Creatinine 0.50 - 1.05 mg/dL 0.78 0.75 0.75 0.68 0.70   BP/Weight 11/18/2018 05/20/2018 10/15/2017 07/09/2017 05/07/2017 04/15/2017 41/02/130  Systolic BP 438 887 579 728 206 015 615  Diastolic BP 76 80 70 84 74 74 80  Wt. (Lbs) 169.04 164 164 174.25 172 170.12 166  BMI 31.42 30 30 32.39 32.5 32.14 31.37   Foot/eye exam completion dates 05/20/2018 04/15/2017  Foot Form Completion Done Done   Updated lab needed at/ before next visit.

## 2018-11-27 NOTE — Assessment & Plan Note (Signed)
Deteriorated. Patient re-educated about  the importance of commitment to a  minimum of 150 minutes of exercise per week.  The importance of healthy food choices with portion control discussed. Encouraged to start a food diary, count calories and to consider  joining a support group. Sample diet sheets offered. Goals set by the patient for the next several months.   Weight /BMI 11/18/2018 05/20/2018 10/15/2017  WEIGHT 169 lb 0.6 oz 164 lb 164 lb  HEIGHT 5' 1.5" 5\' 2"  5\' 2"   BMI 31.42 kg/m2 30 kg/m2 30 kg/m2

## 2018-11-27 NOTE — Progress Notes (Signed)
Kathleen Kirby     MRN: [id number]      DOB: 1961-09-03   HPI Kathleen Kirby is here for follow up and re-evaluation of chronic medical conditions, medication management and review of any available recent lab and radiology data.  Preventive health is updated, specifically  Cancer screening and Immunization.   Questions or concerns regarding consultations or procedures which the Kathleen Kirby has had in the interim are  addressed. The Kathleen Kirby denies any adverse reactions to current medications since the last visit.  C/o rash on right forearm which itches, no new detergent or personal care products, no redness or drainage  ROS Denies recent fever or chills. Denies sinus pressure, nasal congestion, ear pain or sore throat. Denies chest congestion, productive cough or wheezing. Denies chest pains, palpitations and leg swelling Denies abdominal pain, nausea, vomiting,diarrhea or constipation.   Denies dysuria, frequency, hesitancy or incontinence. Denies joint pain, swelling and limitation in mobility. Denies headaches, seizures, numbness, or tingling. Denies depression, anxiety or insomnia. Denies skin break down or rash.   PE  BP (!) 148/76 (BP Location: Right Arm, Patient Position: Sitting, Cuff Size: Large)   Pulse 70   Resp 12   Ht 5' 1.5" (1.562 m)   Wt 169 lb 0.6 oz (76.7 kg)   LMP 11/27/2012   SpO2 97% Comment: room air  BMI 31.42 kg/m   Patient alert and oriented and in no cardiopulmonary distress.  HEENT: No facial asymmetry, EOMI,   oropharynx pink and moist.  Neck supple no JVD, no mass.  Chest: Clear to auscultation bilaterally.  CVS: S1, S2 no murmurs, no S3.Regular rate.  ABD: Soft non tender.   Ext: No edema  MS: Adequate ROM spine, shoulders, hips and knees.    Psych: Good eye contact, normal affect. Memory intact not anxious or depressed appearing.  CNS: CN 2-12 intact, power,  normal throughout.no focal deficits noted.   Assessment & Plan  Essential  hypertension Controlled, no change in medication DASH diet and commitment to daily physical activity for a minimum of 30 minutes discussed and encouraged, as a part of hypertension management. The importance of attaining a healthy weight is also discussed.  BP/Weight 11/18/2018 05/20/2018 10/15/2017 07/09/2017 05/07/2017 04/15/2017 56/43/3295  Systolic BP 188 416 606 301 601 093 235  Diastolic BP 76 80 70 84 74 74 80  Wt. (Lbs) 169.04 164 164 174.25 172 170.12 166  BMI 31.42 30 30 32.39 32.5 32.14 31.37       Hyperlipidemia LDL goal <100 Hyperlipidemia:Low fat diet discussed and encouraged.   Lipid Panel  Lab Results  Component Value Date   CHOL 207 (H) 05/14/2018   HDL 87 05/14/2018   LDLCALC 109 (H) 05/14/2018   TRIG 39 05/14/2018   CHOLHDL 2.4 05/14/2018   Not at goal, needs to reduce fried and fatty foods    Obesity (BMI 30.0-34.9) Deteriorated. Patient re-educated about  the importance of commitment to a  minimum of 150 minutes of exercise per week.  The importance of healthy food choices with portion control discussed. Encouraged to start a food diary, count calories and to consider  joining a support group. Sample diet sheets offered. Goals set by the patient for the next several months.   Weight /BMI 11/18/2018 05/20/2018 10/15/2017  WEIGHT 169 lb 0.6 oz 164 lb 164 lb  HEIGHT 5' 1.5" 5\' 2"  5\' 2"   BMI 31.42 kg/m2 30 kg/m2 30 kg/m2      Type 2 diabetes, diet controlled ([place]) Ms.  Kirby is reminded of the importance of commitment to daily physical activity for 30 minutes or more, as able and the need to limit carbohydrate intake to 30 to 60 grams per meal to help with blood sugar control.    Kathleen Kirby is reminded of the importance of daily foot exam, annual eye examination, and good blood sugar, blood pressure and cholesterol control.  Diabetic Labs Latest Ref Rng & Units 05/14/2018 10/10/2017 07/09/2017 04/10/2017 10/04/2016  HbA1c <5.7 % of total Hgb 6.7(H) 6.6(H)  6.6(H) 6.3(H) 6.2(H)  Microalbumin mg/dL - <0.2 - - -  Micro/Creat Ratio <30 mcg/mg creat - NOTE - - -  Chol <200 mg/dL 207(H) - 205(H) 196 201(H)  HDL >50 mg/dL 87 - 78 91 93  Calc LDL mg/dL (calc) 109(H) - 119(H) 97 98  Triglycerides <150 mg/dL 39 - 40 42 51  Creatinine 0.50 - 1.05 mg/dL 0.78 0.75 0.75 0.68 0.70   BP/Weight 11/18/2018 05/20/2018 10/15/2017 07/09/2017 05/07/2017 04/15/2017 60/73/7106  Systolic BP 269 485 462 703 500 938 182  Diastolic BP 76 80 70 84 74 74 80  Wt. (Lbs) 169.04 164 164 174.25 172 170.12 166  BMI 31.42 30 30 32.39 32.5 32.14 31.37   Foot/eye exam completion dates 05/20/2018 04/15/2017  Foot Form Completion Done Done   Updated lab needed at/ before next visit.      Dermatomycosis Topical clotrimazole/betamethasone prescribed ffor 5 days, then as needed

## 2018-11-27 NOTE — Assessment & Plan Note (Signed)
Hyperlipidemia:Low fat diet discussed and encouraged.   Lipid Panel  Lab Results  Component Value Date   CHOL 207 (H) 05/14/2018   HDL 87 05/14/2018   LDLCALC 109 (H) 05/14/2018   TRIG 39 05/14/2018   CHOLHDL 2.4 05/14/2018   Not at goal, needs to reduce fried and fatty foods

## 2019-04-16 LAB — COMPLETE METABOLIC PANEL WITH GFR
AG RATIO: 1.5 (calc) (ref 1.0–2.5)
ALKALINE PHOSPHATASE (APISO): 92 U/L (ref 37–153)
ALT: 13 U/L (ref 6–29)
AST: 18 U/L (ref 10–35)
Albumin: 4.1 g/dL (ref 3.6–5.1)
BILIRUBIN TOTAL: 0.4 mg/dL (ref 0.2–1.2)
BUN: 11 mg/dL (ref 7–25)
CHLORIDE: 101 mmol/L (ref 98–110)
CO2: 33 mmol/L — ABNORMAL HIGH (ref 20–32)
Calcium: 9.7 mg/dL (ref 8.6–10.4)
Creat: 0.75 mg/dL (ref 0.50–1.05)
GFR, EST AFRICAN AMERICAN: 103 mL/min/{1.73_m2} (ref >=60)
GFR, Est Non African American: 88 mL/min/{1.73_m2} (ref >=60)
Globulin: 2.8 g/dL (calc) (ref 1.9–3.7)
Glucose, Bld: 115 mg/dL — ABNORMAL HIGH (ref 65–99)
POTASSIUM: 3.6 mmol/L (ref 3.5–5.3)
Sodium: 140 mmol/L (ref 135–146)
TOTAL PROTEIN: 6.9 g/dL (ref 6.1–8.1)

## 2019-04-16 LAB — CBC
HCT: 38.3 % (ref 35.0–45.0)
HEMOGLOBIN: 12.5 g/dL (ref 11.7–15.5)
MCH: 25.3 pg — ABNORMAL LOW (ref 27.0–33.0)
MCHC: 32.6 g/dL (ref 32.0–36.0)
MCV: 77.4 fL — ABNORMAL LOW (ref 80.0–100.0)
MPV: 11.3 fL (ref 7.5–12.5)
Platelets: 279 10*3/uL (ref 140–400)
RBC: 4.95 10*6/uL (ref 3.80–5.10)
RDW: 13.2 % (ref 11.0–15.0)
WBC: 4.8 10*3/uL (ref 3.8–10.8)

## 2019-04-16 LAB — HEMOGLOBIN A1C
EAG (MMOL/L): 8.9 (calc)
HEMOGLOBIN A1C: 7.2 %{Hb} — AB (ref <5.7)
MEAN PLASMA GLUCOSE: 160 (calc)

## 2019-04-16 LAB — LIPID PANEL
Cholesterol: 220 mg/dL — ABNORMAL HIGH (ref <200)
HDL: 83 mg/dL (ref >=50)
LDL Cholesterol (Calc): 123 mg/dL (calc) — ABNORMAL HIGH
NON-HDL CHOLESTEROL (CALC): 137 mg/dL — AB (ref <130)
Total CHOL/HDL Ratio: 2.7 (calc) (ref <5.0)
Triglycerides: 51 mg/dL (ref <150)

## 2019-04-16 LAB — VITAMIN D 25 HYDROXY (VIT D DEFICIENCY, FRACTURES): Vit D, 25-Hydroxy: 15 ng/mL — ABNORMAL LOW (ref 30–100)

## 2019-04-16 LAB — TSH: TSH: 1.17 mIU/L (ref 0.40–4.50)

## 2019-04-21 ENCOUNTER — Ambulatory Visit (INDEPENDENT_AMBULATORY_CARE_PROVIDER_SITE_OTHER): Payer: Commercial Managed Care - PPO | Admitting: Family Medicine

## 2019-04-21 ENCOUNTER — Encounter: Payer: Self-pay | Admitting: Family Medicine

## 2019-04-21 VITALS — BP 148/76 | Ht 61.5 in | Wt 169.0 lb

## 2019-04-21 DIAGNOSIS — E119 Type 2 diabetes mellitus without complications: Secondary | ICD-10-CM | POA: Diagnosis not present

## 2019-04-21 DIAGNOSIS — E669 Obesity, unspecified: Secondary | ICD-10-CM | POA: Diagnosis not present

## 2019-04-21 DIAGNOSIS — I10 Essential (primary) hypertension: Secondary | ICD-10-CM | POA: Diagnosis not present

## 2019-04-21 DIAGNOSIS — E785 Hyperlipidemia, unspecified: Secondary | ICD-10-CM | POA: Diagnosis not present

## 2019-04-21 DIAGNOSIS — Z1231 Encounter for screening mammogram for malignant neoplasm of breast: Secondary | ICD-10-CM

## 2019-04-21 MED ORDER — PRAVASTATIN SODIUM 10 MG PO TABS: 10.0000 mg | ORAL_TABLET | Freq: Every day | ORAL | 3 refills | Status: DC

## 2019-04-21 MED ORDER — AMLODIPINE BESYLATE 5 MG PO TABS: 5.0000 mg | ORAL_TABLET | Freq: Every day | ORAL | 1 refills | Status: DC

## 2019-04-21 NOTE — Patient Instructions (Addendum)
F/U with MD  week of  September 7 , in office visit please, also will get shingrix # 1 , and EKG call if you need me before  Please commit to cutting WAY back on  fried and fatty foods and also sugar and starches, also take the pravastatin every morning with your blood pressure medication so that you do not forget  Mammogram being scheduled and appointment info sent also October 5 or after  It is important that you exercise regularly at least 30 minutes 5 times a week. If you develop chest pain, have severe difficulty breathing, or feel very tired, stop exercising immediately and seek medical attention    Please get fasting lipid, cmp and EGFr , hBA1C and microalb last week in August (24)  Your recent HBA1C is 7.2 , goal is at most 7.0 this is for your diabetes and your bad cholesterol is 123, goal is 99 or less and your total cholesterol 220, goal is 199 or less When both opf these are high this increases your risk of heart disease, so very important to reduce both and take medications prescribed     Preventing High Cholesterol Cholesterol is a waxy, fat-like substance that your body needs in small amounts. Your liver makes all the cholesterol that your body needs. Having high cholesterol (hypercholesterolemia) increases your risk for heart disease and stroke. Extra (excess) cholesterol comes from the food you eat, such as animal-based fat (saturated fat) from meat and some dairy products. High cholesterol can often be prevented with diet and lifestyle changes. If you already have high cholesterol, you can control it with diet and lifestyle changes, as well as medicine. What nutrition changes can be made?  Eat less saturated fat. Foods that contain saturated fat include red meat and some dairy products.  Avoid processed meats, like bacon and lunch meats.  Avoid trans fats, which are found in margarine and some baked goods.  Avoid foods and beverages that have added sugars.  Eat more  fruits, vegetables, and whole grains.  Choose healthy sources of protein, such as fish, poultry, and nuts.  Choose healthy sources of fat, such as: ? Nuts. ? Vegetable oils, especially olive oil. ? Fish that have healthy fats (omega-3 fatty acids), such as mackerel or salmon. What lifestyle changes can be made?   Lose weight if you are overweight. Losing 5-10 lb (2.3-4.5 kg) can help prevent or control high cholesterol and reduce your risk for diabetes and high blood pressure. Ask your health care provider to help you with a diet and exercise plan to safely lose weight.  Get enough exercise. Do at least 150 minutes of moderate-intensity exercise each week. ? You could do this in short exercise sessions several times a day, or you could do longer exercise sessions a few times a week. For example, you could take a brisk 10-minute walk or bike ride, 3 times a day, for 5 days a week.  Do not smoke. If you need help quitting, ask your health care provider.  Limit your alcohol intake. If you drink alcohol, limit alcohol intake to no more than 1 drink a day for nonpregnant women and 2 drinks a day for men. One drink equals 12 oz of beer, 5 oz of wine, or 1 oz of hard liquor. Why are these changes important?  If you have high cholesterol, deposits (plaques) may build up on the walls of your blood vessels. Plaques make the arteries narrower and stiffer, which can restrict or  block blood flow and cause blood clots to form. This greatly increases your risk for heart attack and stroke. Making diet and lifestyle changes can reduce your risk for these life-threatening conditions. What can I do to lower my risk?  Manage your risk factors for high cholesterol. Talk with your health care provider about all of your risk factors and how to lower your risk.  Manage other conditions that you have, such as diabetes or high blood pressure (hypertension).  Have your cholesterol checked at regular  intervals.  Keep all follow-up visits as told by your health care provider. This is important. How is this treated? In addition to diet and lifestyle changes, your health care provider may recommend medicines to help lower cholesterol, such as a medicine to reduce the amount of cholesterol made in your liver. You may need medicine if:  Diet and lifestyle changes do not lower your cholesterol enough.  You have high cholesterol and other risk factors for heart disease or stroke. Take over-the-counter and prescription medicines only as told by your health care provider. Where to find more information  American Heart Association: ThisTune.com.pt.jsp  National Heart, Lung, and Blood Institute: FrenchToiletries.com.cy Summary  High cholesterol increases your risk for heart disease and stroke. By keeping your cholesterol level low, you can reduce your risk for these conditions.  Diet and lifestyle changes are the most important steps in preventing high cholesterol.  Work with your health care provider to manage your risk factors, and have your blood tested regularly. This information is not intended to replace advice given to you by your health care provider. Make sure you discuss any questions you have with your health care provider. Document Released: 12/16/2015 Document Revised: 08/09/2016 Document Reviewed: 08/09/2016 Elsevier Interactive Patient Education  2019 [employer].

## 2019-04-21 NOTE — Progress Notes (Signed)
Virtual Visit via Telephone Note  I connected with Claudina Lick on 04/21/19 at  4:00 PM EDT by telephone and verified that I am speaking with the correct person using two identifiers.  Location: Patient: home Provider: office    I discussed the limitations, risks, security and privacy concerns of performing an evaluation and management service by telephone and the availability of in person appointments. I also discussed with the patient that there may be a patient responsible charge related to this service. The patient expressed understanding and agreed to proceed.   History of Present Illness: F/U chronic problems , lab review, and update health maintenance No concerns, handling COVID 19 as best as able, and still working Denies recent fever or chills. Denies sinus pressure, nasal congestion, ear pain or sore throat. Denies chest congestion, productive cough or wheezing. Denies chest pains, palpitations and leg swelling Denies abdominal pain, nausea, vomiting,diarrhea or constipation.   Denies dysuria, frequency, hesitancy or incontinence. Denies joint pain, swelling and limitation in mobility. Denies headaches, seizures, numbness, or tingling. Denies depression, anxiety or insomnia.Recenmtly lost a family member so she is somewhat sad about this Denies skin break down or rash.       Observations/Objective: BP (!) 148/76   Ht 5' 1.5" (1.562 m)   Wt 169 lb (76.7 kg)   LMP 11/27/2012   BMI 31.42 kg/m  Good communication with no confusion and intact memory. Alert and oriented x 3 No signs of respiratory distress during sppech    Assessment and Plan:  Essential hypertension Uncontrolled by history, f/u in office DASH diet and commitment to daily physical activity for a minimum of 30 minutes discussed and encouraged, as a part of hypertension management. The importance of attaining a healthy weight is also discussed.  BP/Weight 04/21/2019 11/18/2018 05/20/2018 10/15/2017  07/09/2017 06/11/3150 06/19/1606  Systolic BP 371 062 694 854 627 035 009  Diastolic BP 76 76 80 70 84 74 74  Wt. (Lbs) 169 169.04 164 164 174.25 172 170.12  BMI 31.42 31.42 30 30 32.39 32.5 32.14       Hyperlipidemia LDL goal <100 Hyperlipidemia:Low fat diet discussed and encouraged.   Lipid Panel  Lab Results  Component Value Date   CHOL 220 (H) 04/15/2019   HDL 83 04/15/2019   LDLCALC 123 (H) 04/15/2019   TRIG 51 04/15/2019   CHOLHDL 2.7 04/15/2019   Uncontrolled , reduce fat intake  And  Comply with medication, reports often missing the dose    Type 2 diabetes, diet controlled ([place]) Ms. Coull is reminded of the importance of commitment to daily physical activity for 30 minutes or more, as able and the need to limit carbohydrate intake to 30 to 60 grams per meal to help with blood sugar control.  Deteriorated, rept in 3.5 months  Still resisting metformin although undoubtedly would improve blood sugar Ms. Darrow is reminded of the importance of daily foot exam, annual eye examination, and good blood sugar, blood pressure and cholesterol control.  Diabetic Labs Latest Ref Rng & Units 04/15/2019 05/14/2018 10/10/2017 07/09/2017 04/10/2017  HbA1c <5.7 % of total Hgb 7.2(H) 6.7(H) 6.6(H) 6.6(H) 6.3(H)  Microalbumin mg/dL - - <0.2 - -  Micro/Creat Ratio <30 mcg/mg creat - - NOTE - -  Chol <200 mg/dL 220(H) 207(H) - 205(H) 196  HDL > OR = 50 mg/dL 83 87 - 78 91  Calc LDL mg/dL (calc) 123(H) 109(H) - 119(H) 97  Triglycerides <150 mg/dL 51 39 - 40 42  Creatinine 0.50 -  1.05 mg/dL 0.75 0.78 0.75 0.75 0.68   BP/Weight 04/21/2019 11/18/2018 05/20/2018 10/15/2017 07/09/2017 08/10/785 06/19/4491  Systolic BP 010 071 219 758 832 549 826  Diastolic BP 76 76 80 70 84 74 74  Wt. (Lbs) 169 169.04 164 164 174.25 172 170.12  BMI 31.42 31.42 30 30 32.39 32.5 32.14   Foot/eye exam completion dates 05/20/2018 04/15/2017  Foot Form Completion Done Done        Obesity (BMI 30.0-34.9)  Patient  re-educated about  the importance of commitment to a  minimum of 150 minutes of exercise per week as able.  The importance of healthy food choices with portion control discussed, as well as eating regularly and within a 12 hour window most days. The need to choose "clean , green" food 50 to 75% of the time is discussed, as well as to make water the primary drink and set a goal of 64 ounces water daily.  Encouraged to start a food diary,  and to consider  joining a support group. Sample diet sheets offered. Goals set by the patient for the next several months.   Weight /BMI 04/21/2019 11/18/2018 05/20/2018  WEIGHT 169 lb 169 lb 0.6 oz 164 lb  HEIGHT 5' 1.5" 5' 1.5" 5\' 2"   BMI 31.42 kg/m2 31.42 kg/m2 30 kg/m2       Follow Up Instructions:    I discussed the assessment and treatment plan with the patient. The patient was provided an opportunity to ask questions and all were answered. The patient agreed with the plan and demonstrated an understanding of the instructions.   The patient was advised to call back or seek an in-person evaluation if the symptoms worsen or if the condition fails to improve as anticipated.  I provided  25 minutes of non-face-to-face time during this encounter.   Tula Nakayama, MD

## 2019-04-23 NOTE — Assessment & Plan Note (Signed)
Hyperlipidemia:Low fat diet discussed and encouraged.   Lipid Panel  Lab Results  Component Value Date   CHOL 220 (H) 04/15/2019   HDL 83 04/15/2019   LDLCALC 123 (H) 04/15/2019   TRIG 51 04/15/2019   CHOLHDL 2.7 04/15/2019   Uncontrolled , reduce fat intake  And  Comply with medication, reports often missing the dose

## 2019-04-23 NOTE — Assessment & Plan Note (Signed)
  Patient re-educated about  the importance of commitment to a  minimum of 150 minutes of exercise per week as able.  The importance of healthy food choices with portion control discussed, as well as eating regularly and within a 12 hour window most days. The need to choose "clean , green" food 50 to 75% of the time is discussed, as well as to make water the primary drink and set a goal of 64 ounces water daily.  Encouraged to start a food diary,  and to consider  joining a support group. Sample diet sheets offered. Goals set by the patient for the next several months.   Weight /BMI 04/21/2019 11/18/2018 05/20/2018  WEIGHT 169 lb 169 lb 0.6 oz 164 lb  HEIGHT 5' 1.5" 5' 1.5" 5\' 2"   BMI 31.42 kg/m2 31.42 kg/m2 30 kg/m2

## 2019-04-23 NOTE — Assessment & Plan Note (Signed)
Kathleen Kirby is reminded of the importance of commitment to daily physical activity for 30 minutes or more, as able and the need to limit carbohydrate intake to 30 to 60 grams per meal to help with blood sugar control.  Deteriorated, rept in 3.5 months  Still resisting metformin although undoubtedly would improve blood sugar Kathleen Kirby is reminded of the importance of daily foot exam, annual eye examination, and good blood sugar, blood pressure and cholesterol control.  Diabetic Labs Latest Ref Rng & Units 04/15/2019 05/14/2018 10/10/2017 07/09/2017 04/10/2017  HbA1c <5.7 % of total Hgb 7.2(H) 6.7(H) 6.6(H) 6.6(H) 6.3(H)  Microalbumin mg/dL - - <0.2 - -  Micro/Creat Ratio <30 mcg/mg creat - - NOTE - -  Chol <200 mg/dL 220(H) 207(H) - 205(H) 196  HDL > OR = 50 mg/dL 83 87 - 78 91  Calc LDL mg/dL (calc) 123(H) 109(H) - 119(H) 97  Triglycerides <150 mg/dL 51 39 - 40 42  Creatinine 0.50 - 1.05 mg/dL 0.75 0.78 0.75 0.75 0.68   BP/Weight 04/21/2019 11/18/2018 05/20/2018 10/15/2017 07/09/2017 06/29/9677 08/17/8100  Systolic BP 751 025 852 778 242 353 614  Diastolic BP 76 76 80 70 84 74 74  Wt. (Lbs) 169 169.04 164 164 174.25 172 170.12  BMI 31.42 31.42 30 30 32.39 32.5 32.14   Foot/eye exam completion dates 05/20/2018 04/15/2017  Foot Form Completion Done Done

## 2019-04-23 NOTE — Assessment & Plan Note (Signed)
Uncontrolled by history, f/u in office DASH diet and commitment to daily physical activity for a minimum of 30 minutes discussed and encouraged, as a part of hypertension management. The importance of attaining a healthy weight is also discussed.  BP/Weight 04/21/2019 11/18/2018 05/20/2018 10/15/2017 07/09/2017 3/95/3202 02/15/4355  Systolic BP 861 683 729 021 115 520 802  Diastolic BP 76 76 80 70 84 74 74  Wt. (Lbs) 169 169.04 164 164 174.25 172 170.12  BMI 31.42 31.42 30 30 32.39 32.5 32.14

## 2019-07-31 ENCOUNTER — Other Ambulatory Visit: Payer: Self-pay | Admitting: Family Medicine

## 2019-08-11 ENCOUNTER — Encounter: Payer: Self-pay | Admitting: *Deleted

## 2019-08-24 ENCOUNTER — Ambulatory Visit: Payer: Commercial Managed Care - PPO | Admitting: Family Medicine

## 2019-09-08 ENCOUNTER — Other Ambulatory Visit: Payer: Self-pay | Admitting: Family Medicine

## 2019-09-08 ENCOUNTER — Ambulatory Visit: Payer: Commercial Managed Care - PPO | Admitting: Family Medicine

## 2019-09-26 ENCOUNTER — Ambulatory Visit: Payer: Commercial Managed Care - PPO | Admitting: Family Medicine

## 2019-09-26 ENCOUNTER — Ambulatory Visit (HOSPITAL_COMMUNITY): Payer: Commercial Managed Care - PPO

## 2019-09-30 ENCOUNTER — Ambulatory Visit (HOSPITAL_COMMUNITY): Payer: Commercial Managed Care - PPO

## 2019-10-21 ENCOUNTER — Encounter: Payer: Self-pay | Admitting: Family Medicine

## 2019-10-21 ENCOUNTER — Other Ambulatory Visit: Payer: Self-pay

## 2019-10-21 ENCOUNTER — Ambulatory Visit (HOSPITAL_COMMUNITY)
Admission: RE | Admit: 2019-10-21 | Discharge: 2019-10-21 | Disposition: A | Discharge status: Home / Self Care | Payer: Commercial Managed Care - PPO | Source: Ambulatory Visit | Attending: Family Medicine | Admitting: Family Medicine

## 2019-10-21 DIAGNOSIS — Z1231 Encounter for screening mammogram for malignant neoplasm of breast: Secondary | ICD-10-CM

## 2019-10-21 LAB — COMPLETE METABOLIC PANEL WITH GFR
AG Ratio: 1.4 (calc) (ref 1.0–2.5)
ALT: 17 U/L (ref 6–29)
AST: 24 U/L (ref 10–35)
Albumin: 4.2 g/dL (ref 3.6–5.1)
Alkaline phosphatase (APISO): 83 U/L (ref 37–153)
BUN: 8 mg/dL (ref 7–25)
CO2: 30 mmol/L (ref 20–32)
Calcium: 10.2 mg/dL (ref 8.6–10.4)
Chloride: 100 mmol/L (ref 98–110)
Creat: 0.75 mg/dL (ref 0.50–1.05)
GFR, Est African American: 102 mL/min/{1.73_m2} (ref >=60)
GFR, Est Non African American: 88 mL/min/{1.73_m2} (ref >=60)
Globulin: 2.9 g/dL (calc) (ref 1.9–3.7)
Glucose, Bld: 96 mg/dL (ref 65–99)
Potassium: 3.5 mmol/L (ref 3.5–5.3)
Sodium: 139 mmol/L (ref 135–146)
Total Bilirubin: 0.6 mg/dL (ref 0.2–1.2)
Total Protein: 7.1 g/dL (ref 6.1–8.1)

## 2019-10-21 LAB — HEMOGLOBIN A1C
Hgb A1c MFr Bld: 6.8 % of total Hgb — ABNORMAL HIGH (ref <5.7)
Mean Plasma Glucose: 148 (calc)
eAG (mmol/L): 8.2 (calc)

## 2019-10-21 LAB — LIPID PANEL
Cholesterol: 214 mg/dL — ABNORMAL HIGH (ref <200)
HDL: 79 mg/dL (ref >=50)
LDL Cholesterol (Calc): 121 mg/dL (calc) — ABNORMAL HIGH
Non-HDL Cholesterol (Calc): 135 mg/dL (calc) — ABNORMAL HIGH (ref <130)
Total CHOL/HDL Ratio: 2.7 (calc) (ref <5.0)
Triglycerides: 47 mg/dL (ref <150)

## 2019-10-27 ENCOUNTER — Other Ambulatory Visit: Payer: Self-pay

## 2019-10-27 ENCOUNTER — Ambulatory Visit (INDEPENDENT_AMBULATORY_CARE_PROVIDER_SITE_OTHER): Payer: Commercial Managed Care - PPO | Admitting: Family Medicine

## 2019-10-27 ENCOUNTER — Encounter: Payer: Self-pay | Admitting: Family Medicine

## 2019-10-27 ENCOUNTER — Other Ambulatory Visit (HOSPITAL_COMMUNITY)
Admission: RE | Admit: 2019-10-27 | Discharge: 2019-10-27 | Disposition: A | Discharge status: Home / Self Care | Payer: Commercial Managed Care - PPO | Source: Ambulatory Visit | Attending: Family Medicine | Admitting: Family Medicine

## 2019-10-27 VITALS — BP 150/78 | HR 74 | Temp 98.4°F | Resp 15 | Ht 61.5 in | Wt 169.1 lb

## 2019-10-27 DIAGNOSIS — Z124 Encounter for screening for malignant neoplasm of cervix: Secondary | ICD-10-CM

## 2019-10-27 DIAGNOSIS — E119 Type 2 diabetes mellitus without complications: Secondary | ICD-10-CM | POA: Diagnosis not present

## 2019-10-27 DIAGNOSIS — I10 Essential (primary) hypertension: Secondary | ICD-10-CM

## 2019-10-27 DIAGNOSIS — Z23 Encounter for immunization: Secondary | ICD-10-CM | POA: Diagnosis not present

## 2019-10-27 DIAGNOSIS — E785 Hyperlipidemia, unspecified: Secondary | ICD-10-CM | POA: Diagnosis not present

## 2019-10-27 DIAGNOSIS — Z0000 Encounter for general adult medical examination without abnormal findings: Secondary | ICD-10-CM

## 2019-10-27 DIAGNOSIS — Z8249 Family history of ischemic heart disease and other diseases of the circulatory system: Secondary | ICD-10-CM

## 2019-10-27 NOTE — Patient Instructions (Addendum)
F/U in 4 months, call if you need me sooner  Pap today   Flu vaccine today  Increased dose of pravachol to 20 mg daily  Blood sugar is improved continue same, congratulations!  EKG is normal , which is good  You need to start aspirin 81 mg once daily for heart protection  Fasting hBA1C,lipid, cmp and EGFr 1 week before next visit  It is important that you exercise regularly at least 30 minutes 5 times a week. If you develop chest pain, have severe difficulty breathing, or feel very tired, stop exercising immediately and seek medical attention  Thanks for choosing [facility], we consider it a privelige to serve you.

## 2019-10-31 ENCOUNTER — Encounter: Payer: Self-pay | Admitting: Family Medicine

## 2019-10-31 NOTE — Progress Notes (Signed)
Kathleen Kirby     MRN: [id number]      DOB: [birth year]  HPI: Patient is in for annual physical exam. Baseline eKG is obtained due to her personal and family history which increases her risk of heart disease. Recent labs, if available are reviewed. Immunization is reviewed , and  updated if needed.   PE: BP (!) 150/78   Pulse 74   Temp 98.4 F (36.9 C) (Oral)   Resp 15   Ht 5' 1.5" (1.562 m)   Wt 169 lb 1.9 oz (76.7 kg)   LMP 11/27/2012   SpO2 98%   BMI 31.44 kg/m   Pleasant  female, alert and oriented x 3, in no cardio-pulmonary distress. Afebrile. HEENT No facial trauma or asymetry. Sinuses non tender.  Extra occullar muscles intact.. External ears normal, . Neck: supple, no adenopathy,JVD or thyromegaly.No bruits.  Chest: Clear to ascultation bilaterally.No crackles or wheezes. Non tender to palpation  Breast: No asymetry,no masses or lumps. No tenderness. No nipple discharge or inversion. No axillary or supraclavicular adenopathy  Cardiovascular system; Heart sounds normal,  S1 and  S2 ,no S3.  No murmur, or thrill. Apical beat not displaced Peripheral pulses normal. EKG; NSR , no LVH, no ischemia  Abdomen: Soft, non tender, no organomegaly or masses. No bruits. Bowel sounds normal. No guarding, tenderness or rebound.   GU: External genitalia normal female genitalia , normal female distribution of hair. No lesions. Urethral meatus normal in size, no  Prolapse, no lesions visibly  Present. Bladder non tender. Vagina pink and moist , with no visible lesions , discharge present . Adequate pelvic support no  cystocele or rectocele noted Cervix pink and appears healthy, no lesions or ulcerations noted, no discharge noted from os Uterus normal size, no adnexal masses, no cervical motion or adnexal tenderness.   Musculoskeletal exam: Full ROM of spine, hips , shoulders and knees. No deformity ,swelling or crepitus noted. No muscle wasting or atrophy.    Neurologic: Cranial nerves 2 to 12 intact. Power, tone ,sensation and reflexes normal throughout. No disturbance in gait. No tremor.  Skin: Intact, no ulceration, erythema , scaling or rash noted. Pigmentation normal throughout  Psych; Normal mood and affect. Judgement and concentration normal   Assessment & Plan:  Annual physical exam Annual exam as documented. Counseling done  re healthy lifestyle involving commitment to 150 minutes exercise per week, heart healthy diet, and attaining healthy weight.The importance of adequate sleep also discussed. Regular seat belt use and home safety, is also discussed. Changes in health habits are decided on by the patient with goals and time frames  set for achieving them. Immunization and cancer screening needs are specifically addressed at this visit.   Type 2 diabetes, diet controlled ([place]) Kathleen Kirby is reminded of the importance of commitment to daily physical activity for 30 minutes or more, as able and the need to limit carbohydrate intake to 30 to 60 grams per meal to help with blood sugar control.   Improved.  Kathleen Kirby is reminded of the importance of daily foot exam, annual eye examination, and good blood sugar, blood pressure and cholesterol control.  Diabetic Labs Latest Ref Rng & Units 10/20/2019 04/15/2019 05/14/2018 10/10/2017 07/09/2017  HbA1c <5.7 % of total Hgb 6.8(H) 7.2(H) 6.7(H) 6.6(H) 6.6(H)  Microalbumin mg/dL - - - <0.2 -  Micro/Creat Ratio <30 mcg/mg creat - - - NOTE -  Chol <200 mg/dL 214(H) 220(H) 207(H) - 205(H)  HDL > OR =  50 mg/dL 79 83 87 - 78  Calc LDL mg/dL (calc) 121(H) 123(H) 109(H) - 119(H)  Triglycerides <150 mg/dL 47 51 39 - 40  Creatinine 0.50 - 1.05 mg/dL 0.75 0.75 0.78 0.75 0.75   BP/Weight 10/27/2019 04/21/2019 11/18/2018 05/20/2018 10/15/2017 07/09/2017 Q000111Q  Systolic BP Q000111Q 123456 123456 123456 [id number] 123456  Diastolic BP 78 76 76 80 70 84 74  Wt. (Lbs) 169.12 169 169.04 164 164 174.25 172  BMI 31.44  31.42 31.42 30 30 32.39 32.5   Foot/eye exam completion dates 10/27/2019 05/20/2018  Foot Form Completion Done Done        Essential hypertension Uncontrolled, ned to adjust medication, will address at next visit. Increased cV risk due to personal h/o dm also f/h of premature cAD in her sister. Office eKG: NSR, no ischemia, no LVH, start prophylactic aspirin daily  Hyperlipidemia LDL goal <100 Hyperlipidemia:Low fat diet discussed and encouraged.   Lipid Panel  Lab Results  Component Value Date   CHOL 214 (H) 10/20/2019   HDL 79 10/20/2019   LDLCALC 121 (H) 10/20/2019   TRIG 47 10/20/2019   CHOLHDL 2.7 10/20/2019  not at goal, increase dose of pravachol

## 2019-10-31 NOTE — Assessment & Plan Note (Signed)
[name] is reminded of the importance of commitment to daily physical activity for 30 minutes or more, as able and the need to limit carbohydrate intake to 30 to 60 grams per meal to help with blood sugar control.   Improved.  Ms. Ran is reminded of the importance of daily foot exam, annual eye examination, and good blood sugar, blood pressure and cholesterol control.  Diabetic Labs Latest Ref Rng & Units 10/20/2019 04/15/2019 05/14/2018 10/10/2017 07/09/2017  HbA1c <5.7 % of total Hgb 6.8(H) 7.2(H) 6.7(H) 6.6(H) 6.6(H)  Microalbumin mg/dL - - - <0.2 -  Micro/Creat Ratio <30 mcg/mg creat - - - NOTE -  Chol <200 mg/dL 214(H) 220(H) 207(H) - 205(H)  HDL > OR = 50 mg/dL 79 83 87 - 78  Calc LDL mg/dL (calc) 121(H) 123(H) 109(H) - 119(H)  Triglycerides <150 mg/dL 47 51 39 - 40  Creatinine 0.50 - 1.05 mg/dL 0.75 0.75 0.78 0.75 0.75   BP/Weight 10/27/2019 04/21/2019 11/18/2018 05/20/2018 10/15/2017 07/09/2017 Q000111Q  Systolic BP Q000111Q 123456 123456 123456 [id number] 123456  Diastolic BP 78 76 76 80 70 84 74  Wt. (Lbs) 169.12 169 169.04 164 164 174.25 172  BMI 31.44 31.42 31.42 30 30 32.39 32.5   Foot/eye exam completion dates 10/27/2019 05/20/2018  Foot Form Completion Done Done

## 2019-10-31 NOTE — Assessment & Plan Note (Signed)
Uncontrolled, ned to adjust medication, will address at next visit. Increased cV risk due to personal h/o dm also f/h of premature cAD in her sister. Office eKG: NSR, no ischemia, no LVH, start prophylactic aspirin daily

## 2019-10-31 NOTE — Assessment & Plan Note (Signed)

## 2019-10-31 NOTE — Assessment & Plan Note (Signed)
Hyperlipidemia:Low fat diet discussed and encouraged.   Lipid Panel  Lab Results  Component Value Date   CHOL 214 (H) 10/20/2019   HDL 79 10/20/2019   LDLCALC 121 (H) 10/20/2019   TRIG 47 10/20/2019   CHOLHDL 2.7 10/20/2019  not at goal, increase dose of pravachol

## 2019-11-08 ENCOUNTER — Telehealth: Payer: Self-pay | Admitting: Family Medicine

## 2019-11-08 NOTE — Telephone Encounter (Signed)
MOST RECENT PAPA HAS NORMAL CELLS BUT IS hpv POSITIVE X 3 , WILL REFER TO [facility] FOR RE EVAL, I LEFT A MESSAGE FOR HER TO CALL, UNABLE TO SPEAK WITH HER. WILL ALSO SEND MSG TO GYNE TO ENSURE HE NEEDS TO SEE HER PLS LET HER KNOW THANKS

## 2019-11-14 ENCOUNTER — Telehealth: Payer: Self-pay

## 2019-11-14 NOTE — Telephone Encounter (Signed)
P;ls address [facility]

## 2019-11-14 NOTE — Telephone Encounter (Signed)
Gave pap results. She stated she needs you to send in her paper for her wellness for work. Today is the last day to get it in.

## 2019-11-14 NOTE — Telephone Encounter (Signed)
Pt aware per abby

## 2019-11-23 ENCOUNTER — Telehealth: Payer: Self-pay | Admitting: Family Medicine

## 2019-11-23 NOTE — Telephone Encounter (Signed)
Is it psossible to add to her recent pap

## 2019-11-23 NOTE — Telephone Encounter (Signed)
Called and cytology is faxing me an add on form- will complete and fax back to add on

## 2019-11-23 NOTE — Telephone Encounter (Signed)
Is  It possible to add to her recen pap, HPV 16, and 18/45  Doubt stil have specimen, but please check as Dr Elonda Husky recvommended this be added to ALL pts with abnormal paps.  Please let me know, thanks

## 2019-11-24 NOTE — Telephone Encounter (Signed)
GREAT to hear that it is not too late!!!

## 2019-11-25 ENCOUNTER — Telehealth: Payer: Self-pay

## 2019-11-25 NOTE — Telephone Encounter (Signed)
Kathleen Kirby from [facility] called an stated that the add on test result to be added on to her could not be added due to the test had already been disposed of

## 2019-11-28 ENCOUNTER — Other Ambulatory Visit: Payer: Self-pay | Admitting: Family Medicine

## 2019-11-28 LAB — CYTOLOGY - PAP
Comment: NEGATIVE
Diagnosis: NEGATIVE
High risk HPV: POSITIVE — AB

## 2019-11-28 NOTE — Telephone Encounter (Signed)
Kathleen Kirby, Please note, I have sent another message to Dr Elonda Husky asking for his help since we cannot add on the tests

## 2019-11-28 NOTE — Telephone Encounter (Signed)
noted 

## 2019-12-01 NOTE — Telephone Encounter (Signed)
Patient cannot come in [place] or Tues of next week because she signed up to work but she said the earliest she could come in was Jan 4th at 8. I told her the reason for the recollection and she understands

## 2019-12-01 NOTE — Telephone Encounter (Signed)
Pls arrange asap visit for rept pap to  type for hPV 16 and 18 , explain to pt this is gyne recommendation, if negative will follow up with me, if positive will need to see gyne for colposcopy I confirmed this with Dr Elonda Husky today  Very important we complete asap

## 2019-12-16 ENCOUNTER — Other Ambulatory Visit: Payer: Self-pay | Admitting: Family Medicine

## 2019-12-19 ENCOUNTER — Other Ambulatory Visit: Payer: Self-pay

## 2019-12-19 ENCOUNTER — Ambulatory Visit: Payer: Commercial Managed Care - PPO | Admitting: Family Medicine

## 2019-12-19 ENCOUNTER — Other Ambulatory Visit (HOSPITAL_COMMUNITY)
Admission: RE | Admit: 2019-12-19 | Discharge: 2019-12-19 | Disposition: A | Discharge status: Home / Self Care | Payer: Commercial Managed Care - PPO | Source: Ambulatory Visit | Attending: Family Medicine | Admitting: Family Medicine

## 2019-12-19 ENCOUNTER — Encounter: Payer: Self-pay | Admitting: Family Medicine

## 2019-12-19 VITALS — BP 130/74 | HR 78 | Resp 15 | Ht 61.5 in | Wt 172.1 lb

## 2019-12-19 DIAGNOSIS — R8789 Other abnormal findings in specimens from female genital organs: Secondary | ICD-10-CM

## 2019-12-19 DIAGNOSIS — B977 Papillomavirus as the cause of diseases classified elsewhere: Secondary | ICD-10-CM

## 2019-12-19 DIAGNOSIS — R87810 Cervical high risk human papillomavirus (HPV) DNA test positive: Secondary | ICD-10-CM | POA: Insufficient documentation

## 2019-12-19 DIAGNOSIS — R87618 Other abnormal cytological findings on specimens from cervix uteri: Secondary | ICD-10-CM

## 2019-12-19 NOTE — Patient Instructions (Signed)
WE WILL BE IN TOUCH WITH RESULTS OF TETS TODAY AS SOON AS AVAILABLE.  F/U AS BEFORE  It is important that you exercise regularly at least 30 minutes 5 times a week. If you develop chest pain, have severe difficulty breathing, or feel very tired, stop exercising immediately and seek medical attention   Think about what you will eat, plan ahead. Choose " clean, green, fresh or frozen" over canned, processed or packaged foods which are more sugary, salty and fatty. 70 to 75% of food eaten should be vegetables and fruit. Three meals at set times with snacks allowed between meals, but they must be fruit or vegetables. Aim to eat over a 12 hour period , example 7 am to 7 pm, and STOP after  your last meal of the day. Drink water,generally about 64 ounces per day, no other drink is as healthy. Fruit juice is best enjoyed in a healthy way, by EATING the fruit.

## 2019-12-19 NOTE — Progress Notes (Signed)
Repeat pap with hpv SEROTYP[ING ONLY NO COMPLAINTS

## 2019-12-22 ENCOUNTER — Encounter: Payer: Self-pay | Admitting: Family Medicine

## 2019-12-22 LAB — CERVICOVAGINAL ANCILLARY ONLY
Comment: NEGATIVE
HPV 16: NEGATIVE
HPV 18 / 45: NEGATIVE

## 2019-12-23 ENCOUNTER — Encounter: Payer: Self-pay | Admitting: Family Medicine

## 2019-12-23 NOTE — Assessment & Plan Note (Signed)
Pt in for specimen collection for HPV serotyping only, specimen collected and sent

## 2020-02-17 ENCOUNTER — Other Ambulatory Visit: Payer: Self-pay | Admitting: Family Medicine

## 2020-03-01 ENCOUNTER — Ambulatory Visit: Payer: Commercial Managed Care - PPO | Admitting: Family Medicine

## 2020-03-10 LAB — LIPID PANEL
Cholesterol: 204 mg/dL — ABNORMAL HIGH (ref <200)
HDL: 73 mg/dL (ref >=50)
LDL Cholesterol (Calc): 117 mg/dL (calc) — ABNORMAL HIGH
Non-HDL Cholesterol (Calc): 131 mg/dL (calc) — ABNORMAL HIGH (ref <130)
Total CHOL/HDL Ratio: 2.8 (calc) (ref <5.0)
Triglycerides: 52 mg/dL (ref <150)

## 2020-03-10 LAB — COMPLETE METABOLIC PANEL WITH GFR
AG Ratio: 1.5 (calc) (ref 1.0–2.5)
ALT: 15 U/L (ref 6–29)
AST: 20 U/L (ref 10–35)
Albumin: 4.2 g/dL (ref 3.6–5.1)
Alkaline phosphatase (APISO): 92 U/L (ref 37–153)
BUN: 12 mg/dL (ref 7–25)
CO2: 31 mmol/L (ref 20–32)
Calcium: 10.1 mg/dL (ref 8.6–10.4)
Chloride: 99 mmol/L (ref 98–110)
Creat: 0.7 mg/dL (ref 0.50–1.05)
GFR, Est African American: 111 mL/min/{1.73_m2} (ref >=60)
GFR, Est Non African American: 96 mL/min/{1.73_m2} (ref >=60)
Globulin: 2.8 g/dL (calc) (ref 1.9–3.7)
Glucose, Bld: 109 mg/dL — ABNORMAL HIGH (ref 65–99)
Potassium: 3.7 mmol/L (ref 3.5–5.3)
Sodium: 138 mmol/L (ref 135–146)
Total Bilirubin: 0.5 mg/dL (ref 0.2–1.2)
Total Protein: 7 g/dL (ref 6.1–8.1)

## 2020-03-10 LAB — HEMOGLOBIN A1C
Hgb A1c MFr Bld: 7.2 % of total Hgb — ABNORMAL HIGH (ref <5.7)
Mean Plasma Glucose: 160 (calc)
eAG (mmol/L): 8.9 (calc)

## 2020-03-15 ENCOUNTER — Other Ambulatory Visit: Payer: Self-pay

## 2020-03-15 ENCOUNTER — Encounter: Payer: Self-pay | Admitting: Family Medicine

## 2020-03-15 ENCOUNTER — Ambulatory Visit: Payer: Commercial Managed Care - PPO | Admitting: Family Medicine

## 2020-03-15 VITALS — BP 140/78 | HR 92 | Temp 97.0°F | Resp 16 | Ht 62.0 in | Wt 176.0 lb

## 2020-03-15 DIAGNOSIS — Z23 Encounter for immunization: Secondary | ICD-10-CM

## 2020-03-15 DIAGNOSIS — E119 Type 2 diabetes mellitus without complications: Secondary | ICD-10-CM | POA: Diagnosis not present

## 2020-03-15 DIAGNOSIS — E669 Obesity, unspecified: Secondary | ICD-10-CM

## 2020-03-15 DIAGNOSIS — E785 Hyperlipidemia, unspecified: Secondary | ICD-10-CM | POA: Diagnosis not present

## 2020-03-15 DIAGNOSIS — I10 Essential (primary) hypertension: Secondary | ICD-10-CM | POA: Diagnosis not present

## 2020-03-15 NOTE — Assessment & Plan Note (Signed)
Continue statin, might need increase in level is trying 3 months of lifestyle changes.  Encourage low-fat diet at this time.

## 2020-03-15 NOTE — Progress Notes (Signed)
Subjective:  Patient ID: Kathleen Kirby, female    DOB: 1961-03-02  Age: 59 y.o. MRN: [id number]  CC:  Chief Complaint  Patient presents with  . Hypertension    follow up visit with lab review   . Hyperlipidemia      HPI  HPI Ms. Everding is a 59 year old female patient of Dr. Moshe Cipro.  Who presents today for follow-up on her chronic conditions including hypertension, hyper lipidemia, diabetes and obesity.  Recent lab work showed a increase in her A1c to 7.2% and elevated cholesterol levels even though she is taken pravastatin.  She declines wanting to take any medications for these and would like 3 months for lifestyle changes to be performed.  She notes that she is not been eating well and she has not been exercising like she should.  Today patient denies signs and symptoms of COVID 19 infection including fever, chills, cough, shortness of breath, and headache. Past Medical, Surgical, Social History, Allergies, and Medications have been Reviewed.   Past Medical History:  Diagnosis Date  . Allergy   . Anxiety about health 07/20/2017  . Back pain   . Bilateral shoulder pain   . Diabetes mellitus without complication ([place])   . FH: CAD (coronary artery disease) 09/14/2014   Sibling with MI under age 52   . Hyperlipidemia   . Hypertension   . Metabolic syndrome X [id number]  . Mild dysplasia of cervix 12/21/2013  . Obesity   . Right hip pain     Current Meds  Medication Sig  . amLODipine (NORVASC) 5 MG tablet TAKE 1 TABLET BY MOUTH  DAILY  . Multiple Minerals-Vitamins (CALCIUM & VIT D3 BONE HEALTH PO) Take by mouth.  . Potassium 99 MG TABS Take by mouth.  . pravastatin (PRAVACHOL) 10 MG tablet TAKE 1 TABLET BY MOUTH  DAILY  . triamterene-hydrochlorothiazide (MAXZIDE) 75-50 MG tablet TAKE 1 TABLET BY MOUTH  DAILY    ROS:  Review of Systems  HENT: Negative.   Eyes: Negative.   Respiratory: Negative.   Cardiovascular: Negative.   Gastrointestinal: Negative.     Genitourinary: Negative.   Musculoskeletal: Negative.   Skin: Negative.   Neurological: Negative.   Endo/Heme/Allergies: Negative.   Psychiatric/Behavioral: Negative.   All other systems reviewed and are negative.    Objective:   Today's Vitals: BP 140/78   Pulse 92   Temp (!) 97 F (36.1 C) (Temporal)   Resp 16   Ht 5\' 2"  (1.575 m)   Wt 176 lb (79.8 kg)   LMP 11/27/2012   SpO2 97%   BMI 32.19 kg/m  Vitals with BMI 03/15/2020 12/19/2019 10/27/2019  Height 5\' 2"  5' 1.5" 5' 1.5"  Weight 176 lbs 172 lbs 1 oz 169 lbs 2 oz  BMI 32.18 123456 [id number]  Systolic [id number]  Diastolic 78 74 78  Pulse 92 78 74     Physical Exam Vitals and nursing note reviewed.  Constitutional:      Appearance: Normal appearance. She is well-developed and well-groomed. She is obese.  HENT:     Head: Normocephalic and atraumatic.     Right Ear: External ear normal.     Left Ear: External ear normal.     Mouth/Throat:     Comments: Mask in place  Eyes:     General:        Right eye: No discharge.        Left eye: No discharge.     Conjunctiva/sclera:  Conjunctivae normal.  Cardiovascular:     Rate and Rhythm: Normal rate and regular rhythm.     Pulses: Normal pulses.     Heart sounds: Normal heart sounds.  Pulmonary:     Effort: Pulmonary effort is normal.     Breath sounds: Normal breath sounds.  Musculoskeletal:        General: Normal range of motion.     Cervical back: Normal range of motion and neck supple.  Skin:    General: Skin is warm.  Neurological:     General: No focal deficit present.     Mental Status: She is alert and oriented to person, place, and time.  Psychiatric:        Attention and Perception: Attention normal.        Mood and Affect: Mood normal.        Speech: Speech normal.        Behavior: Behavior normal. Behavior is cooperative.        Thought Content: Thought content normal.        Cognition and Memory: Cognition normal.        Judgment: Judgment normal.      Assessment   1. Type 2 diabetes, diet controlled ([place])   2. Essential hypertension   3. Obesity (BMI 30.0-34.9)   4. Hyperlipidemia LDL goal <100   5. Need for zoster vaccination     Tests ordered Orders Placed This Encounter  Procedures  . Varicella-zoster vaccine IM  . CBC  . COMPLETE METABOLIC PANEL WITH GFR  . Hemoglobin A1c  . Lipid panel     Plan: Please see assessment and plan per problem list above.   No orders of the defined types were placed in this encounter.   Patient to follow-up in 3 months   Perlie Mayo, NP

## 2020-03-15 NOTE — Assessment & Plan Note (Addendum)
Kathleen Kirby is encouraged to maintain a well balanced diet that is low in salt. Controlled-but would like to see better control 130/80 goal, continue current medication regimen.  She is also reminded that exercise is beneficial for heart health and control of  Blood pressure. 30-60 minutes daily is recommended-walking was suggested.

## 2020-03-15 NOTE — Assessment & Plan Note (Signed)
Shingles first dose provided today in office.  Patient was educated on the recommendation for shingles vaccine. After obtaining informed consent, the vaccine was administered no adverse effects noted at time of administration. Patient provided with education on arm soreness and use of tylenol or ibuprofen (if safe) for this. Encourage to use the arm vaccine was given in to help reduce the soreness. Patient educated on the signs of a reaction to the vaccine and advised to contact the office should these occur.

## 2020-03-15 NOTE — Patient Instructions (Signed)
I appreciate the opportunity to provide you with care for your health and wellness. Today we discussed: Blood pressure and diabetes  Follow up: 3 months BP and labs  Fasting labs one week before next appt  Please continue to practice social distancing to keep you, your family, and our community safe.  If you must go out, please wear a mask and practice good handwashing.  It was a pleasure to see you and I look forward to continuing to work together on your health and well-being. Please do not hesitate to call the office if you need care or have questions about your care.  Have a wonderful day and week. With Gratitude, Cherly Beach, DNP, AGNP-BC   DASH Eating Plan DASH stands for "Dietary Approaches to Stop Hypertension." The DASH eating plan is a healthy eating plan that has been shown to reduce high blood pressure (hypertension). It may also reduce your risk for type 2 diabetes, heart disease, and stroke. The DASH eating plan may also help with weight loss. What are tips for following this plan?  General guidelines  Avoid eating more than 2,300 mg (milligrams) of salt (sodium) a day. If you have hypertension, you may need to reduce your sodium intake to 1,500 mg a day.  Limit alcohol intake to no more than 1 drink a day for nonpregnant women and 2 drinks a day for men. One drink equals 12 oz of beer, 5 oz of wine, or 1 oz of hard liquor.  Work with your health care provider to maintain a healthy body weight or to lose weight. Ask what an ideal weight is for you.  Get at least 30 minutes of exercise that causes your heart to beat faster (aerobic exercise) most days of the week. Activities may include walking, swimming, or biking.  Work with your health care provider or diet and nutrition specialist (dietitian) to adjust your eating plan to your individual calorie needs. Reading food labels   Check food labels for the amount of sodium per serving. Choose foods with less than 5  percent of the Daily Value of sodium. Generally, foods with less than 300 mg of sodium per serving fit into this eating plan.  To find whole grains, look for the word "whole" as the first word in the ingredient list. Shopping  Buy products labeled as "low-sodium" or "no salt added."  Buy fresh foods. Avoid canned foods and premade or frozen meals. Cooking  Avoid adding salt when cooking. Use salt-free seasonings or herbs instead of table salt or sea salt. Check with your health care provider or pharmacist before using salt substitutes.  Do not fry foods. Cook foods using healthy methods such as baking, boiling, grilling, and broiling instead.  Cook with heart-healthy oils, such as olive, canola, soybean, or sunflower oil. Meal planning  Eat a balanced diet that includes: ? 5 or more servings of fruits and vegetables each day. At each meal, try to fill half of your plate with fruits and vegetables. ? Up to 6-8 servings of whole grains each day. ? Less than 6 oz of lean meat, poultry, or fish each day. A 3-oz serving of meat is about the same size as a deck of cards. One egg equals 1 oz. ? 2 servings of low-fat dairy each day. ? A serving of nuts, seeds, or beans 5 times each week. ? Heart-healthy fats. Healthy fats called Omega-3 fatty acids are found in foods such as flaxseeds and coldwater fish, like sardines, salmon, and  mackerel.  Limit how much you eat of the following: ? Canned or prepackaged foods. ? Food that is high in trans fat, such as fried foods. ? Food that is high in saturated fat, such as fatty meat. ? Sweets, desserts, sugary drinks, and other foods with added sugar. ? Full-fat dairy products.  Do not salt foods before eating.  Try to eat at least 2 vegetarian meals each week.  Eat more home-cooked food and less restaurant, buffet, and fast food.  When eating at a restaurant, ask that your food be prepared with less salt or no salt, if possible. What foods are  recommended? The items listed may not be a complete list. Talk with your dietitian about what dietary choices are best for you. Grains Whole-grain or whole-wheat bread. Whole-grain or whole-wheat pasta. Brown rice. [place]. Bulgur. Whole-grain and low-sodium cereals. Pita bread. Low-fat, low-sodium crackers. Whole-wheat flour tortillas. Vegetables Fresh or frozen vegetables (raw, steamed, roasted, or grilled). Low-sodium or reduced-sodium tomato and vegetable juice. Low-sodium or reduced-sodium tomato sauce and tomato paste. Low-sodium or reduced-sodium canned vegetables. Fruits All fresh, dried, or frozen fruit. Canned fruit in natural juice (without added sugar). Meat and other protein foods Skinless chicken or [place]. Ground chicken or [place]. Pork with fat trimmed off. Fish and seafood. Egg whites. Dried beans, peas, or lentils. Unsalted nuts, nut butters, and seeds. Unsalted canned beans. Lean cuts of beef with fat trimmed off. Low-sodium, lean deli meat. Dairy Low-fat (1%) or fat-free (skim) milk. Fat-free, low-fat, or reduced-fat cheeses. Nonfat, low-sodium ricotta or cottage cheese. Low-fat or nonfat yogurt. Low-fat, low-sodium cheese. Fats and oils Soft margarine without trans fats. Vegetable oil. Low-fat, reduced-fat, or light mayonnaise and salad dressings (reduced-sodium). Canola, safflower, olive, soybean, and sunflower oils. Avocado. Seasoning and other foods Herbs. Spices. Seasoning mixes without salt. Unsalted popcorn and pretzels. Fat-free sweets. What foods are not recommended? The items listed may not be a complete list. Talk with your dietitian about what dietary choices are best for you. Grains Baked goods made with fat, such as croissants, muffins, or some breads. Dry pasta or rice meal packs. Vegetables Creamed or fried vegetables. Vegetables in a cheese sauce. Regular canned vegetables (not low-sodium or reduced-sodium). Regular canned tomato sauce and paste (not  low-sodium or reduced-sodium). Regular tomato and vegetable juice (not low-sodium or reduced-sodium). Angie Fava. Olives. Fruits Canned fruit in a light or heavy syrup. Fried fruit. Fruit in cream or butter sauce. Meat and other protein foods Fatty cuts of meat. Ribs. Fried meat. Berniece Salines. Sausage. Bologna and other processed lunch meats. Salami. Fatback. Hotdogs. Bratwurst. Salted nuts and seeds. Canned beans with added salt. Canned or smoked fish. Whole eggs or egg yolks. Chicken or [place] with skin. Dairy Whole or 2% milk, cream, and half-and-half. Whole or full-fat cream cheese. Whole-fat or sweetened yogurt. Full-fat cheese. Nondairy creamers. Whipped toppings. Processed cheese and cheese spreads. Fats and oils Butter. Stick margarine. Lard. Shortening. Ghee. Bacon fat. Tropical oils, such as coconut, palm kernel, or palm oil. Seasoning and other foods Salted popcorn and pretzels. Onion salt, garlic salt, seasoned salt, table salt, and sea salt. Worcestershire sauce. Tartar sauce. Barbecue sauce. Teriyaki sauce. Soy sauce, including reduced-sodium. Steak sauce. Canned and packaged gravies. Fish sauce. Oyster sauce. Cocktail sauce. Horseradish that you find on the shelf. Ketchup. Mustard. Meat flavorings and tenderizers. Bouillon cubes. Hot sauce and Tabasco sauce. Premade or packaged marinades. Premade or packaged taco seasonings. Relishes. Regular salad dressings. Where to find more information:  National Heart, Lung,  and Blood Institute: https://wilson-eaton.com/  American Heart Association: www.heart.org Summary  The DASH eating plan is a healthy eating plan that has been shown to reduce high blood pressure (hypertension). It may also reduce your risk for type 2 diabetes, heart disease, and stroke.  With the DASH eating plan, you should limit salt (sodium) intake to 2,300 mg a day. If you have hypertension, you may need to reduce your sodium intake to 1,500 mg a day.  When on the DASH eating plan,  aim to eat more fresh fruits and vegetables, whole grains, lean proteins, low-fat dairy, and heart-healthy fats.  Work with your health care provider or diet and nutrition specialist (dietitian) to adjust your eating plan to your individual calorie needs. This information is not intended to replace advice given to you by your health care provider. Make sure you discuss any questions you have with your health care provider. Document Revised: 11/13/2017 Document Reviewed: 11/24/2016 Elsevier Patient Education  2020 [place] for Diabetes Mellitus, Adult  Carbohydrate counting is a method of keeping track of how many carbohydrates you eat. Eating carbohydrates naturally increases the amount of sugar (glucose) in the blood. Counting how many carbohydrates you eat helps keep your blood glucose within normal limits, which helps you manage your diabetes (diabetes mellitus). It is important to know how many carbohydrates you can safely have in each meal. This is different for every person. A diet and nutrition specialist (registered dietitian) can help you make a meal plan and calculate how many carbohydrates you should have at each meal and snack. Carbohydrates are found in the following foods:  Grains, such as breads and cereals.  Dried beans and soy products.  Starchy vegetables, such as potatoes, peas, and corn.  Fruit and fruit juices.  Milk and yogurt.  Sweets and snack foods, such as cake, cookies, candy, chips, and soft drinks. How do I count carbohydrates? There are two ways to count carbohydrates in food. You can use either of the methods or a combination of both. Reading "Nutrition Facts" on packaged food The "Nutrition Facts" list is included on the labels of almost all packaged foods and beverages in the [place] It includes:  The serving size.  Information about nutrients in each serving, including the grams (g) of carbohydrate per serving. To use the  "Nutrition Facts":  Decide how many servings you will have.  Multiply the number of servings by the number of carbohydrates per serving.  The resulting number is the total amount of carbohydrates that you will be having. Learning standard serving sizes of other foods When you eat carbohydrate foods that are not packaged or do not include "Nutrition Facts" on the label, you need to measure the servings in order to count the amount of carbohydrates:  Measure the foods that you will eat with a food scale or measuring cup, if needed.  Decide how many standard-size servings you will eat.  Multiply the number of servings by 15. Most carbohydrate-rich foods have about 15 g of carbohydrates per serving. ? For example, if you eat 8 oz (170 g) of strawberries, you will have eaten 2 servings and 30 g of carbohydrates (2 servings x 15 g = 30 g).  For foods that have more than one food mixed, such as soups and casseroles, you must count the carbohydrates in each food that is included. The following list contains standard serving sizes of common carbohydrate-rich foods. Each of these servings has about 15 g  of carbohydrates:   hamburger bun or  English muffin.   oz (15 mL) syrup.   oz (14 g) jelly.  1 slice of bread.  1 six-inch tortilla.  3 oz (85 g) cooked rice or pasta.  4 oz (113 g) cooked dried beans.  4 oz (113 g) starchy vegetable, such as peas, corn, or potatoes.  4 oz (113 g) hot cereal.  4 oz (113 g) mashed potatoes or  of a large baked potato.  4 oz (113 g) canned or frozen fruit.  4 oz (120 mL) fruit juice.  4-6 crackers.  6 chicken nuggets.  6 oz (170 g) unsweetened dry cereal.  6 oz (170 g) plain fat-free yogurt or yogurt sweetened with artificial sweeteners.  8 oz (240 mL) milk.  8 oz (170 g) fresh fruit or one small piece of fruit.  24 oz (680 g) popped popcorn. Example of carbohydrate counting Sample meal  3 oz (85 g) chicken breast.  6 oz (170 g)  brown rice.  4 oz (113 g) corn.  8 oz (240 mL) milk.  8 oz (170 g) strawberries with sugar-free whipped topping. Carbohydrate calculation 1. Identify the foods that contain carbohydrates: ? Rice. ? Corn. ? Milk. ? Strawberries. 2. Calculate how many servings you have of each food: ? 2 servings rice. ? 1 serving corn. ? 1 serving milk. ? 1 serving strawberries. 3. Multiply each number of servings by 15 g: ? 2 servings rice x 15 g = 30 g. ? 1 serving corn x 15 g = 15 g. ? 1 serving milk x 15 g = 15 g. ? 1 serving strawberries x 15 g = 15 g. 4. Add together all of the amounts to find the total grams of carbohydrates eaten: ? 30 g + 15 g + 15 g + 15 g = 75 g of carbohydrates total. Summary  Carbohydrate counting is a method of keeping track of how many carbohydrates you eat.  Eating carbohydrates naturally increases the amount of sugar (glucose) in the blood.  Counting how many carbohydrates you eat helps keep your blood glucose within normal limits, which helps you manage your diabetes.  A diet and nutrition specialist (registered dietitian) can help you make a meal plan and calculate how many carbohydrates you should have at each meal and snack. This information is not intended to replace advice given to you by your health care provider. Make sure you discuss any questions you have with your health care provider. Document Revised: 06/25/2017 Document Reviewed: 05/14/2016 Elsevier Patient Education  [place].    Diabetes Mellitus and Nutrition, Adult When you have diabetes (diabetes mellitus), it is very important to have healthy eating habits because your blood sugar (glucose) levels are greatly affected by what you eat and drink. Eating healthy foods in the appropriate amounts, at about the same times every day, can help you:  Control your blood glucose.  Lower your risk of heart disease.  Improve your blood pressure.  Reach or maintain a healthy  weight. Every person with diabetes is different, and each person has different needs for a meal plan. Your health care provider may recommend that you work with a diet and nutrition specialist (dietitian) to make a meal plan that is best for you. Your meal plan may vary depending on factors such as:  The calories you need.  The medicines you take.  Your weight.  Your blood glucose, blood pressure, and cholesterol levels.  Your activity level.  Other health conditions you have, such as heart or kidney disease. How do carbohydrates affect me? Carbohydrates, also called carbs, affect your blood glucose level more than any other type of food. Eating carbs naturally raises the amount of glucose in your blood. Carb counting is a method for keeping track of how many carbs you eat. Counting carbs is important to keep your blood glucose at a healthy level, especially if you use insulin or take certain oral diabetes medicines. It is important to know how many carbs you can safely have in each meal. This is different for every person. Your dietitian can help you calculate how many carbs you should have at each meal and for each snack. Foods that contain carbs include:  Bread, cereal, rice, pasta, and crackers.  Potatoes and corn.  Peas, beans, and lentils.  Milk and yogurt.  Fruit and juice.  Desserts, such as cakes, cookies, ice cream, and candy. How does alcohol affect me? Alcohol can cause a sudden decrease in blood glucose (hypoglycemia), especially if you use insulin or take certain oral diabetes medicines. Hypoglycemia can be a life-threatening condition. Symptoms of hypoglycemia (sleepiness, dizziness, and confusion) are similar to symptoms of having too much alcohol. If your health care provider says that alcohol is safe for you, follow these guidelines:  Limit alcohol intake to no more than 1 drink per day for nonpregnant women and 2 drinks per day for men. One drink equals 12 oz of  beer, 5 oz of wine, or 1 oz of hard liquor.  Do not drink on an empty stomach.  Keep yourself hydrated with water, diet soda, or unsweetened iced tea.  Keep in mind that regular soda, juice, and other mixers may contain a lot of sugar and must be counted as carbs. What are tips for following this plan?  Reading food labels  Start by checking the serving size on the "Nutrition Facts" label of packaged foods and drinks. The amount of calories, carbs, fats, and other nutrients listed on the label is based on one serving of the item. Many items contain more than one serving per package.  Check the total grams (g) of carbs in one serving. You can calculate the number of servings of carbs in one serving by dividing the total carbs by 15. For example, if a food has 30 g of total carbs, it would be equal to 2 servings of carbs.  Check the number of grams (g) of saturated and trans fats in one serving. Choose foods that have low or no amount of these fats.  Check the number of milligrams (mg) of salt (sodium) in one serving. Most people should limit total sodium intake to less than 2,300 mg per day.  Always check the nutrition information of foods labeled as "low-fat" or "nonfat". These foods may be higher in added sugar or refined carbs and should be avoided.  Talk to your dietitian to identify your daily goals for nutrients listed on the label. Shopping  Avoid buying canned, premade, or processed foods. These foods tend to be high in fat, sodium, and added sugar.  Shop around the outside edge of the grocery store. This includes fresh fruits and vegetables, bulk grains, fresh meats, and fresh dairy. Cooking  Use low-heat cooking methods, such as baking, instead of high-heat cooking methods like deep frying.  Cook using healthy oils, such as olive, canola, or sunflower oil.  Avoid cooking with butter, cream, or high-fat meats. Meal planning  Eat meals and snacks  regularly, preferably at  the same times every day. Avoid going long periods of time without eating.  Eat foods high in fiber, such as fresh fruits, vegetables, beans, and whole grains. Talk to your dietitian about how many servings of carbs you can eat at each meal.  Eat 4-6 ounces (oz) of lean protein each day, such as lean meat, chicken, fish, eggs, or tofu. One oz of lean protein is equal to: ? 1 oz of meat, chicken, or fish. ? 1 egg. ?  cup of tofu.  Eat some foods each day that contain healthy fats, such as avocado, nuts, seeds, and fish. Lifestyle  Check your blood glucose regularly.  Exercise regularly as told by your health care provider. This may include: ? 150 minutes of moderate-intensity or vigorous-intensity exercise each week. This could be brisk walking, biking, or water aerobics. ? Stretching and doing strength exercises, such as yoga or weightlifting, at least 2 times a week.  Take medicines as told by your health care provider.  Do not use any products that contain nicotine or tobacco, such as cigarettes and e-cigarettes. If you need help quitting, ask your health care provider.  Work with a Social worker or diabetes educator to identify strategies to manage stress and any emotional and social challenges. Questions to ask a health care provider  Do I need to meet with a diabetes educator?  Do I need to meet with a dietitian?  What number can I call if I have questions?  When are the best times to check my blood glucose? Where to find more information:  American Diabetes Association: diabetes.org  Academy of Nutrition and Dietetics: www.eatright.[employer] of Diabetes and Digestive and Kidney Diseases (NIH): [employer] Summary  A healthy meal plan will help you control your blood glucose and maintain a healthy lifestyle.  Working with a diet and nutrition specialist (dietitian) can help you make a meal plan that is best for you.  Keep in mind that carbohydrates  (carbs) and alcohol have immediate effects on your blood glucose levels. It is important to count carbs and to use alcohol carefully. This information is not intended to replace advice given to you by your health care provider. Make sure you discuss any questions you have with your health care provider. Document Revised: 11/13/2017 Document Reviewed: 01/05/2017 Elsevier Patient Education  2020 [employer].

## 2020-03-15 NOTE — Assessment & Plan Note (Addendum)
Kathleen Kirby is encouraged to check blood sugar daily as directed. Declined start medication, wants 3 months to adjust lifestyle. Is on statin as well. Educated on importance of maintain a well balanced diabetic friendly diet.  She is reminded the importance of maintaining  good blood sugars,  taking medications as directed, daily foot care, annual eye exams. Additionally educated about keeping good control over blood pressure and cholesterol as well.

## 2020-03-15 NOTE — Assessment & Plan Note (Signed)
Deteriorated,  Kathleen Kirby is educated about the importance of exercise daily to help with weight management. A minumum of 30 minutes daily is recommended. Additionally, importance of healthy food choices  with portion control discussed.  Wt Readings from Last 3 Encounters:  03/15/20 176 lb (79.8 kg)  12/19/19 172 lb 1.3 oz (78.1 kg)  10/27/19 169 lb 1.9 oz (76.7 kg)

## 2020-05-08 ENCOUNTER — Other Ambulatory Visit: Payer: Self-pay | Admitting: Family Medicine

## 2020-05-26 ENCOUNTER — Other Ambulatory Visit: Payer: Self-pay | Admitting: Family Medicine

## 2020-06-21 ENCOUNTER — Ambulatory Visit: Payer: Commercial Managed Care - PPO | Admitting: Family Medicine

## 2020-07-16 ENCOUNTER — Ambulatory Visit: Payer: Commercial Managed Care - PPO | Admitting: Family Medicine

## 2020-07-19 ENCOUNTER — Ambulatory Visit: Payer: Commercial Managed Care - PPO | Admitting: Family Medicine

## 2020-08-27 ENCOUNTER — Other Ambulatory Visit: Payer: Self-pay

## 2020-08-27 MED ORDER — PRAVASTATIN SODIUM 10 MG PO TABS: 10.0000 mg | ORAL_TABLET | Freq: Every day | ORAL | 3 refills | Status: DC

## 2020-09-01 LAB — COMPLETE METABOLIC PANEL WITHOUT GFR
AG Ratio: 1.5 (calc) (ref 1.0–2.5)
ALT: 11 U/L (ref 6–29)
AST: 18 U/L (ref 10–35)
Albumin: 3.9 g/dL (ref 3.6–5.1)
Alkaline phosphatase (APISO): 85 U/L (ref 37–153)
BUN: 10 mg/dL (ref 7–25)
CO2: 30 mmol/L (ref 20–32)
Calcium: 9.6 mg/dL (ref 8.6–10.4)
Chloride: 100 mmol/L (ref 98–110)
Creat: 0.66 mg/dL (ref 0.50–1.05)
GFR, Est African American: 112 mL/min/1.73m2
GFR, Est Non African American: 97 mL/min/1.73m2
Globulin: 2.6 g/dL (ref 1.9–3.7)
Glucose, Bld: 107 mg/dL — ABNORMAL HIGH (ref 65–99)
Potassium: 3.3 mmol/L — ABNORMAL LOW (ref 3.5–5.3)
Sodium: 138 mmol/L (ref 135–146)
Total Bilirubin: 0.3 mg/dL (ref 0.2–1.2)
Total Protein: 6.5 g/dL (ref 6.1–8.1)

## 2020-09-01 LAB — LIPID PANEL
Cholesterol: 204 mg/dL — ABNORMAL HIGH (ref <200)
HDL: 78 mg/dL (ref >=50)
LDL Cholesterol (Calc): 113 mg/dL (calc) — ABNORMAL HIGH
Non-HDL Cholesterol (Calc): 126 mg/dL (calc) (ref <130)
Total CHOL/HDL Ratio: 2.6 (calc) (ref <5.0)
Triglycerides: 51 mg/dL (ref <150)

## 2020-09-01 LAB — CBC
HCT: 37.3 % (ref 35.0–45.0)
Hemoglobin: 12.1 g/dL (ref 11.7–15.5)
MCH: 25.4 pg — ABNORMAL LOW (ref 27.0–33.0)
MCHC: 32.4 g/dL (ref 32.0–36.0)
MCV: 78.2 fL — ABNORMAL LOW (ref 80.0–100.0)
MPV: 11.3 fL (ref 7.5–12.5)
Platelets: 309 10*3/uL (ref 140–400)
RBC: 4.77 10*6/uL (ref 3.80–5.10)
RDW: 13.3 % (ref 11.0–15.0)
WBC: 5.3 10*3/uL (ref 3.8–10.8)

## 2020-09-01 LAB — HEMOGLOBIN A1C
Hgb A1c MFr Bld: 6.8 %{Hb} — ABNORMAL HIGH
Mean Plasma Glucose: 148 (calc)
eAG (mmol/L): 8.2 (calc)

## 2020-09-06 ENCOUNTER — Other Ambulatory Visit (HOSPITAL_COMMUNITY)
Admission: RE | Admit: 2020-09-06 | Discharge: 2020-09-06 | Disposition: A | Discharge status: Home / Self Care | Payer: Commercial Managed Care - PPO | Source: Other Acute Inpatient Hospital | Attending: Family Medicine | Admitting: Family Medicine

## 2020-09-06 ENCOUNTER — Encounter: Payer: Self-pay | Admitting: Family Medicine

## 2020-09-06 ENCOUNTER — Ambulatory Visit (INDEPENDENT_AMBULATORY_CARE_PROVIDER_SITE_OTHER): Payer: Commercial Managed Care - PPO | Admitting: Family Medicine

## 2020-09-06 ENCOUNTER — Other Ambulatory Visit: Payer: Self-pay

## 2020-09-06 VITALS — BP 146/77 | HR 76 | Resp 16 | Ht 62.0 in | Wt 165.0 lb

## 2020-09-06 DIAGNOSIS — E669 Obesity, unspecified: Secondary | ICD-10-CM

## 2020-09-06 DIAGNOSIS — E785 Hyperlipidemia, unspecified: Secondary | ICD-10-CM

## 2020-09-06 DIAGNOSIS — Z1231 Encounter for screening mammogram for malignant neoplasm of breast: Secondary | ICD-10-CM | POA: Diagnosis not present

## 2020-09-06 DIAGNOSIS — N3000 Acute cystitis without hematuria: Secondary | ICD-10-CM

## 2020-09-06 DIAGNOSIS — Z23 Encounter for immunization: Secondary | ICD-10-CM | POA: Diagnosis not present

## 2020-09-06 DIAGNOSIS — E119 Type 2 diabetes mellitus without complications: Secondary | ICD-10-CM | POA: Insufficient documentation

## 2020-09-06 DIAGNOSIS — I10 Essential (primary) hypertension: Secondary | ICD-10-CM | POA: Diagnosis not present

## 2020-09-06 DIAGNOSIS — E66811 Obesity, class 1: Secondary | ICD-10-CM

## 2020-09-06 LAB — POCT URINALYSIS DIP (CLINITEK)
Bilirubin, UA: NEGATIVE
Blood, UA: NEGATIVE
Glucose, UA: NEGATIVE mg/dL
Ketones, POC UA: NEGATIVE mg/dL
Leukocytes, UA: NEGATIVE
Nitrite, UA: NEGATIVE
POC PROTEIN,UA: NEGATIVE
Spec Grav, UA: 1.02 (ref 1.010–1.025)
Urobilinogen, UA: 0.2 E.U./dL
pH, UA: 8.5 — AB (ref 5.0–8.0)

## 2020-09-06 MED ORDER — AMLODIPINE BESYLATE 10 MG PO TABS: 10.0000 mg | ORAL_TABLET | Freq: Every day | ORAL | 3 refills | Status: DC

## 2020-09-06 NOTE — Assessment & Plan Note (Signed)
Mild symptoms, normal CCUA, reassured no infection, push water intake

## 2020-09-06 NOTE — Assessment & Plan Note (Signed)
After obtaining informed consent, the vaccine is  administered , with no adverse effect noted at the time of administration.  

## 2020-09-06 NOTE — Progress Notes (Signed)
Kathleen Kirby     MRN: [id number]      DOB: [birth year]   HPI Kathleen Kirby is here for follow up and re-evaluation of chronic medical conditions, medication management and review of any available recent lab and radiology data.  Preventive health is updated, specifically  Cancer screening and Immunization.   Questions or concerns regarding consultations or procedures which the PT has had in the interim are  addressed. The PT denies any adverse reactions to current medications since the last visit.  There are no new concerns.  There are no specific complaints   ROS Denies recent fever or chills. Denies sinus pressure, nasal congestion, ear pain or sore throat. Denies chest congestion, productive cough or wheezing. Denies chest pains, palpitations and leg swelling Denies abdominal pain, nausea, vomiting,diarrhea or constipation.   C/o urine being " hot" x 1 monht, denies fever, chills or flank pain Denies joint pain, swelling and limitation in mobility. Denies headaches, seizures, numbness, or tingling. Denies depression, anxiety or insomnia. Denies skin break down or rash.   PE  BP (!) 146/77   Pulse 76   Resp 16   Ht 5\' 2"  (1.575 m)   Wt 165 lb (74.8 kg)   LMP 11/27/2012   SpO2 96%   BMI 30.18 kg/m   Patient alert and oriented and in no cardiopulmonary distress.  HEENT: No facial asymmetry, EOMI,     Neck supple .  Chest: Clear to auscultation bilaterally.  CVS: S1, S2 no murmurs, no S3.Regular rate.  ABD: Soft non tender.   Ext: No edema  MS: Adequate ROM spine, shoulders, hips and knees.  Skin: Intact, no ulcerations or rash noted.  Psych: Good eye contact, normal affect. Memory intact not anxious or depressed appearing.  CNS: CN 2-12 intact, power,  normal throughout.no focal deficits noted.   Assessment & Plan Essential hypertension Unxcontrolled, increase amlodipine DASH diet and commitment to daily physical activity for a minimum of 30 minutes  discussed and encouraged, as a part of hypertension management. The importance of attaining a healthy weight is also discussed.  BP/Weight 09/06/2020 03/15/2020 12/19/2019 10/27/2019 04/21/2019 31/04/4007 05/21/6194  Systolic BP 093 267 124 580 998 338 250  Diastolic BP 77 78 74 78 76 76 80  Wt. (Lbs) 165 176 172.08 169.12 169 169.04 164  BMI 30.18 32.19 31.99 31.44 31.42 31.42 30       Type 2 diabetes, diet controlled (HCC) Improved, continue non pharmacological management Kathleen Kirby is reminded of the importance of commitment to daily physical activity for 30 minutes or more, as able and the need to limit carbohydrate intake to 30 to 60 grams per meal to help with blood sugar control.   Kathleen Kirby is reminded of the importance of daily foot exam, annual eye examination, and good blood sugar, blood pressure and cholesterol control.  Diabetic Labs Latest Ref Rng & Units 08/31/2020 03/09/2020 10/20/2019 04/15/2019 05/14/2018  HbA1c <5.7 % of total Hgb 6.8(H) 7.2(H) 6.8(H) 7.2(H) 6.7(H)  Microalbumin mg/dL - - - - -  Micro/Creat Ratio <30 mcg/mg creat - - - - -  Chol <200 mg/dL 204(H) 204(H) 214(H) 220(H) 207(H)  HDL > OR = 50 mg/dL 78 73 79 83 87  Calc LDL mg/dL (calc) 113(H) 117(H) 121(H) 123(H) 109(H)  Triglycerides <150 mg/dL 51 52 47 51 39  Creatinine 0.50 - 1.05 mg/dL 0.66 0.70 0.75 0.75 0.78   BP/Weight 09/06/2020 03/15/2020 12/19/2019 10/27/2019 04/21/2019 53/08/7672 03/15/9378  Systolic BP 024 097 353  150 196 222 979  Diastolic BP 77 78 74 78 76 76 80  Wt. (Lbs) 165 176 172.08 169.12 169 169.04 164  BMI 30.18 32.19 31.99 31.44 31.42 31.42 30   Foot/eye exam completion dates 10/27/2019 05/20/2018  Foot Form Completion Done Done        Obesity (BMI 30.0-34.9)  Patient re-educated about  the importance of commitment to a  minimum of 150 minutes of exercise per week as able.  The importance of healthy food choices with portion control discussed, as well as eating regularly and within a 12 hour  window most days. The need to choose "clean , green" food 50 to 75% of the time is discussed, as well as to make water the primary drink and set a goal of 64 ounces water daily.    Weight /BMI 09/06/2020 03/15/2020 12/19/2019  WEIGHT 165 lb 176 lb 172 lb 1.3 oz  HEIGHT 5\' 2"  5\' 2"  5' 1.5"  BMI 30.18 kg/m2 32.19 kg/m2 31.99 kg/m2      Hyperlipidemia LDL goal <100 Hyperlipidemia:Low fat diet discussed and encouraged.   Lipid Panel  Lab Results  Component Value Date   CHOL 204 (H) 08/31/2020   HDL 78 08/31/2020   LDLCALC 113 (H) 08/31/2020   TRIG 51 08/31/2020   CHOLHDL 2.6 08/31/2020     Needs increased dose medication but currently resisting  Need for zoster vaccination After obtaining informed consent, the vaccine is  administered , with no adverse effect noted at the time of administration.   Acute cystitis without hematuria Mild symptoms, normal CCUA, reassured no infection, push water intake

## 2020-09-06 NOTE — Patient Instructions (Addendum)
nnual exam with pap wih MD Nov 14 or after, call if you need me before. Flu vaccine at visit  Shingrix #2 today  Microalb and CCUA today  BP is still above goal, INCREASE amlodipine to 10 mg once daily, you may take TWO amlodpine 5 mg tablets together every day till these are finished then  Take 10 mg once daily)  Mammogram due in Novemebr to be scheduled at checkout  Please reduce fried and fatty foods, as bad cholesterol is still too high  Nurse to send for eye exam from [facility] in [place], pt thinks done in 2021   CONGRATS on improved blood sugar , keep it  Up  It is important that you exercise regularly at least 30 minutes 5 times a week. If you develop chest pain, have severe difficulty breathing, or feel very tired, stop exercising immediately and seek medical attention   Think about what you will eat, plan ahead. Choose " clean, green, fresh or frozen" over canned, processed or packaged foods which are more sugary, salty and fatty. 70 to 75% of food eaten should be vegetables and fruit. Three meals at set times with snacks allowed between meals, but they must be fruit or vegetables. Aim to eat over a 12 hour period , example 7 am to 7 pm, and STOP after  your last meal of the day. Drink water,generally about 64 ounces per day, no other drink is as healthy. Fruit juice is best enjoyed in a healthy way, by EATING the fruit.

## 2020-09-06 NOTE — Assessment & Plan Note (Signed)
Hyperlipidemia:Low fat diet discussed and encouraged.   Lipid Panel  Lab Results  Component Value Date   CHOL 204 (H) 08/31/2020   HDL 78 08/31/2020   LDLCALC 113 (H) 08/31/2020   TRIG 51 08/31/2020   CHOLHDL 2.6 08/31/2020     Needs increased dose medication but currently resisting

## 2020-09-06 NOTE — Assessment & Plan Note (Signed)
Unxcontrolled, increase amlodipine DASH diet and commitment to daily physical activity for a minimum of 30 minutes discussed and encouraged, as a part of hypertension management. The importance of attaining a healthy weight is also discussed.  BP/Weight 09/06/2020 03/15/2020 12/19/2019 10/27/2019 04/21/2019 37/12/6965 07/23/3809  Systolic BP 175 102 585 277 824 235 361  Diastolic BP 77 78 74 78 76 76 80  Wt. (Lbs) 165 176 172.08 169.12 169 169.04 164  BMI 30.18 32.19 31.99 31.44 31.42 31.42 30

## 2020-09-06 NOTE — Assessment & Plan Note (Signed)
  Patient re-educated about  the importance of commitment to a  minimum of 150 minutes of exercise per week as able.  The importance of healthy food choices with portion control discussed, as well as eating regularly and within a 12 hour window most days. The need to choose "clean , green" food 50 to 75% of the time is discussed, as well as to make water the primary drink and set a goal of 64 ounces water daily.    Weight /BMI 09/06/2020 03/15/2020 12/19/2019  WEIGHT 165 lb 176 lb 172 lb 1.3 oz  HEIGHT 5\' 2"  5\' 2"  5' 1.5"  BMI 30.18 kg/m2 32.19 kg/m2 31.99 kg/m2

## 2020-09-06 NOTE — Assessment & Plan Note (Addendum)
Improved, continue non pharmacological management Kathleen Kirby is reminded of the importance of commitment to daily physical activity for 30 minutes or more, as able and the need to limit carbohydrate intake to 30 to 60 grams per meal to help with blood sugar control.   Kathleen Kirby is reminded of the importance of daily foot exam, annual eye examination, and good blood sugar, blood pressure and cholesterol control.  Diabetic Labs Latest Ref Rng & Units 08/31/2020 03/09/2020 10/20/2019 04/15/2019 05/14/2018  HbA1c <5.7 % of total Hgb 6.8(H) 7.2(H) 6.8(H) 7.2(H) 6.7(H)  Microalbumin mg/dL - - - - -  Micro/Creat Ratio <30 mcg/mg creat - - - - -  Chol <200 mg/dL 204(H) 204(H) 214(H) 220(H) 207(H)  HDL > OR = 50 mg/dL 78 73 79 83 87  Calc LDL mg/dL (calc) 113(H) 117(H) 121(H) 123(H) 109(H)  Triglycerides <150 mg/dL 51 52 47 51 39  Creatinine 0.50 - 1.05 mg/dL 0.66 0.70 0.75 0.75 0.78   BP/Weight 09/06/2020 03/15/2020 12/19/2019 10/27/2019 04/21/2019 82/0/8138 07/21/1958  Systolic BP 747 185 501 586 825 749 355  Diastolic BP 77 78 74 78 76 76 80  Wt. (Lbs) 165 176 172.08 169.12 169 169.04 164  BMI 30.18 32.19 31.99 31.44 31.42 31.42 30   Foot/eye exam completion dates 10/27/2019 05/20/2018  Foot Form Completion Done Done

## 2020-09-07 LAB — MICROALBUMIN, URINE: Microalb, Ur: <3 ug/mL — ABNORMAL HIGH

## 2020-10-19 ENCOUNTER — Other Ambulatory Visit: Payer: Self-pay

## 2020-10-19 ENCOUNTER — Ambulatory Visit
Admission: RE | Admit: 2020-10-19 | Discharge: 2020-10-19 | Disposition: A | Discharge status: Home / Self Care | Payer: Commercial Managed Care - PPO | Source: Ambulatory Visit | Attending: Family Medicine | Admitting: Family Medicine

## 2020-10-19 DIAGNOSIS — Z1231 Encounter for screening mammogram for malignant neoplasm of breast: Secondary | ICD-10-CM

## 2020-10-25 ENCOUNTER — Encounter: Payer: Self-pay | Admitting: Family Medicine

## 2020-10-25 ENCOUNTER — Other Ambulatory Visit: Payer: Self-pay

## 2020-10-25 ENCOUNTER — Ambulatory Visit (INDEPENDENT_AMBULATORY_CARE_PROVIDER_SITE_OTHER): Payer: Commercial Managed Care - PPO | Admitting: Family Medicine

## 2020-10-25 ENCOUNTER — Other Ambulatory Visit (HOSPITAL_COMMUNITY)
Admission: RE | Admit: 2020-10-25 | Discharge: 2020-10-25 | Disposition: A | Discharge status: Home / Self Care | Payer: Commercial Managed Care - PPO | Source: Ambulatory Visit | Attending: Family Medicine | Admitting: Family Medicine

## 2020-10-25 VITALS — BP 157/83 | HR 69 | Resp 16 | Ht 61.0 in | Wt 168.1 lb

## 2020-10-25 DIAGNOSIS — Z0000 Encounter for general adult medical examination without abnormal findings: Secondary | ICD-10-CM

## 2020-10-25 DIAGNOSIS — Z124 Encounter for screening for malignant neoplasm of cervix: Secondary | ICD-10-CM | POA: Diagnosis not present

## 2020-10-25 DIAGNOSIS — I10 Essential (primary) hypertension: Secondary | ICD-10-CM | POA: Diagnosis not present

## 2020-10-25 DIAGNOSIS — E119 Type 2 diabetes mellitus without complications: Secondary | ICD-10-CM | POA: Diagnosis not present

## 2020-10-25 DIAGNOSIS — R87618 Other abnormal cytological findings on specimens from cervix uteri: Secondary | ICD-10-CM

## 2020-10-25 DIAGNOSIS — Z566 Other physical and mental strain related to work: Secondary | ICD-10-CM | POA: Diagnosis not present

## 2020-10-25 DIAGNOSIS — Z23 Encounter for immunization: Secondary | ICD-10-CM

## 2020-10-25 DIAGNOSIS — E785 Hyperlipidemia, unspecified: Secondary | ICD-10-CM

## 2020-10-25 LAB — POCT GLYCOSYLATED HEMOGLOBIN (HGB A1C): Hemoglobin A1C: 7.1 % — AB (ref 4.0–5.6)

## 2020-10-25 MED ORDER — LOSARTAN POTASSIUM 50 MG PO TABS: 50.0000 mg | ORAL_TABLET | Freq: Every day | ORAL | 1 refills | Status: DC

## 2020-10-25 NOTE — Progress Notes (Signed)
Kathleen Kirby     MRN: [id number]      DOB: [birth year]  HPI: Patient is in for annual physical exam. C/o increased stress on the job in the past 3 weeks, which causes her to be excessively stressed and having crying spells and anxiety C/o leg and ankle swelling with amlodipine dose increased , medication change is made Recent labs, if available are reviewed. Immunization is reviewed , and  updated    PE: BP (!) 157/83   Pulse 69   Resp 16   Ht 5\' 1"  (1.549 m)   Wt 168 lb 1.3 oz (76.2 kg)   LMP 11/27/2012   SpO2 100%   BMI 31.76 kg/m   Pleasant  female, alert and oriented x 3, in no cardio-pulmonary distress. Afebrile. HEENT No facial trauma or asymetry. Sinuses non tender.  Extra occullar muscles intact.. External ears normal, . Neck: supple, no adenopathy,JVD or thyromegaly.No bruits.  Chest: Clear to ascultation bilaterally.No crackles or wheezes. Non tender to palpation  Breast: No asymetry,no masses or lumps. No tenderness. No nipple discharge or inversion. No axillary or supraclavicular adenopathy  Cardiovascular system; Heart sounds normal,  S1 and  S2 ,no S3.  No murmur, or thrill. Apical beat not displaced Peripheral pulses normal.  Abdomen: Soft, non tender, no organomegaly or masses. No bruits. Bowel sounds normal. No guarding, tenderness or rebound.   GU: External genitalia normal female genitalia , normal female distribution of hair. No lesions. Urethral meatus normal in size, no  Prolapse, no lesions visibly  Present. Bladder non tender. Vagina pink and moist , with no visible lesions , discharge present . Adequate pelvic support no  cystocele or rectocele noted Cervix pink and appears healthy, no lesions or ulcerations noted, no discharge noted from os Uterus normal size, no adnexal masses, no cervical motion or adnexal tenderness.   Musculoskeletal exam: Full ROM of spine, hips , shoulders and knees. No deformity ,swelling or crepitus  noted. No muscle wasting or atrophy.   Neurologic: Cranial nerves 2 to 12 intact. Power, tone ,sensation and reflexes normal throughout. No disturbance in gait. No tremor.  Skin: Intact, no ulceration, erythema , scaling or rash noted. Pigmentation normal throughout  Psych; Normal mood and affect. Judgement and concentration normal   Assessment & Plan:  Essential hypertension Uncontrolled and intolerant of amlodipine, start cozaar 50 mg one daily, continue maxzide DASH diet and commitment to daily physical activity for a minimum of 30 minutes discussed and encouraged, as a part of hypertension management. The importance of attaining a healthy weight is also discussed.  BP/Weight 10/25/2020 09/06/2020 03/15/2020 12/19/2019 10/27/2019 04/21/2019 88/04/276  Systolic BP 412 878 676 720 947 096 283  Diastolic BP 83 77 78 74 78 76 76  Wt. (Lbs) 168.08 165 176 172.08 169.12 169 169.04  BMI 31.76 30.18 32.19 31.99 31.44 31.42 31.42       Type 2 diabetes, diet controlled (Kathleen Kirby) Updated lab needed at/ before next visit. Kathleen Kirby is reminded of the importance of commitment to daily physical activity for 30 minutes or more, as able and the need to limit carbohydrate intake to 30 to 60 grams per meal to help with blood sugar control.   Kathleen Kirby is reminded of the importance of daily foot exam, annual eye examination, and good blood sugar, blood pressure and cholesterol control.  Diabetic Labs Latest Ref Rng & Units 09/06/2020 08/31/2020 03/09/2020 10/20/2019 04/15/2019  HbA1c <5.7 % of total Hgb - 6.8(H) 7.2(H) 6.8(H)  7.2(H)  Microalbumin Not Estab. ug/mL <3.0(H) - - - -  Micro/Creat Ratio <30 mcg/mg creat - - - - -  Chol <200 mg/dL - 204(H) 204(H) 214(H) 220(H)  HDL > OR = 50 mg/dL - 78 73 79 83  Calc LDL mg/dL (calc) - 113(H) 117(H) 121(H) 123(H)  Triglycerides <150 mg/dL - 51 52 47 51  Creatinine 0.50 - 1.05 mg/dL - 0.66 0.70 0.75 0.75   BP/Weight 10/25/2020 09/06/2020 03/15/2020 12/19/2019  10/27/2019 04/21/2019 00/02/4916  Systolic BP 915 056 979 480 165 537 482  Diastolic BP 83 77 78 74 78 76 76  Wt. (Lbs) 168.08 165 176 172.08 169.12 169 169.04  BMI 31.76 30.18 32.19 31.99 31.44 31.42 31.42   Foot/eye exam completion dates 10/27/2019 05/20/2018  Foot Form Completion Done Done        Hyperlipidemia LDL goal <100 Hyperlipidemia:Low fat diet discussed and encouraged.   Lipid Panel  Lab Results  Component Value Date   CHOL 204 (H) 08/31/2020   HDL 78 08/31/2020   LDLCALC 113 (H) 08/31/2020   TRIG 51 08/31/2020   CHOLHDL 2.6 08/31/2020   Updated lab needed at/ before next visit. Not AT GOAL NEEDS TO LOWER FAT INTAKE    Pap smear abnormality of cervix/human papillomavirus (HPV) positive PAP SENT  Stress at work 3 week h/o increased and uncontrolled anxiety and stress, refer to therapist, not depressed  Encounter for annual physical exam Annual exam as documented. Counseling done  re healthy lifestyle involving commitment to 150 minutes exercise per week, heart healthy diet, and attaining healthy weight.The importance of adequate sleep also discussed. Regular seat belt use and home safety, is also discussed. Changes in health habits are decided on by the patient with goals and time frames  set for achieving them. Immunization and cancer screening needs are specifically addressed at this visit.

## 2020-10-25 NOTE — Assessment & Plan Note (Signed)
PAP SENT 

## 2020-10-25 NOTE — Assessment & Plan Note (Signed)
Hyperlipidemia:Low fat diet discussed and encouraged.   Lipid Panel  Lab Results  Component Value Date   CHOL 204 (H) 08/31/2020   HDL 78 08/31/2020   LDLCALC 113 (H) 08/31/2020   TRIG 51 08/31/2020   CHOLHDL 2.6 08/31/2020   Updated lab needed at/ before next visit. Not AT GOAL NEEDS TO LOWER FAT INTAKE

## 2020-10-25 NOTE — Assessment & Plan Note (Addendum)
3 week h/o increased and uncontrolled anxiety and stress, refer to therapist, not depressed

## 2020-10-25 NOTE — Patient Instructions (Addendum)
F/u in office with MD re evaluate blood pressure and  Anxiety end January, call if you need me sooner  Flu vaccine today.  You are referred to therapist  Blood sugar doing well   Blood pressure uncontrolled Please start regular exercise for mental and physical health, including blood pressure Stop amlodipine once you get the cozaar 50 mg tablet prescribed  Reduce salt in diet and work on smaller portion sizes to promote weight loss  Best for 2022!  Fasting lipid, cmp and eGFr 1 weeks before  next visit please  Thanks for choosing [facility], we consider it a privelige to serve you.

## 2020-10-25 NOTE — Assessment & Plan Note (Addendum)
Updated lab needed at/ before next visit. Ms. Rauth is reminded of the importance of commitment to daily physical activity for 30 minutes or more, as able and the need to limit carbohydrate intake to 30 to 60 grams per meal to help with blood sugar control.   TMs. Hostetler is reminded of the importance of daily foot exam, annual eye examination, and good blood sugar, blood pressure and cholesterol control.  Diabetic Labs Latest Ref Rng & Units 09/06/2020 08/31/2020 03/09/2020 10/20/2019 04/15/2019  HbA1c <5.7 % of total Hgb - 6.8(H) 7.2(H) 6.8(H) 7.2(H)  Microalbumin Not Estab. ug/mL <3.0(H) - - - -  Micro/Creat Ratio <30 mcg/mg creat - - - - -  Chol <200 mg/dL - 204(H) 204(H) 214(H) 220(H)  HDL > OR = 50 mg/dL - 78 73 79 83  Calc LDL mg/dL (calc) - 113(H) 117(H) 121(H) 123(H)  Triglycerides <150 mg/dL - 51 52 47 51  Creatinine 0.50 - 1.05 mg/dL - 0.66 0.70 0.75 0.75   BP/Weight 10/25/2020 09/06/2020 03/15/2020 12/19/2019 10/27/2019 04/21/2019 33/0/0762  Systolic BP 263 335 456 256 389 373 428  Diastolic BP 83 77 78 74 78 76 76  Wt. (Lbs) 168.08 165 176 172.08 169.12 169 169.04  BMI 31.76 30.18 32.19 31.99 31.44 31.42 31.42   Foot/eye exam completion dates 10/27/2019 05/20/2018  Foot Form Completion Done Done

## 2020-10-25 NOTE — Assessment & Plan Note (Signed)
Uncontrolled and intolerant of amlodipine, start cozaar 50 mg one daily, continue maxzide DASH diet and commitment to daily physical activity for a minimum of 30 minutes discussed and encouraged, as a part of hypertension management. The importance of attaining a healthy weight is also discussed.  BP/Weight 10/25/2020 09/06/2020 03/15/2020 12/19/2019 10/27/2019 04/21/2019 30/0/7622  Systolic BP 633 354 562 563 893 734 287  Diastolic BP 83 77 78 74 78 76 76  Wt. (Lbs) 168.08 165 176 172.08 169.12 169 169.04  BMI 31.76 30.18 32.19 31.99 31.44 31.42 31.42

## 2020-10-28 ENCOUNTER — Encounter: Payer: Self-pay | Admitting: Family Medicine

## 2020-10-28 NOTE — Assessment & Plan Note (Signed)

## 2020-11-01 ENCOUNTER — Encounter: Payer: Commercial Managed Care - PPO | Admitting: Family Medicine

## 2020-11-01 LAB — CYTOLOGY - PAP
Comment: NEGATIVE
Comment: NEGATIVE
Diagnosis: NEGATIVE
HPV 16: NEGATIVE
HPV 18 / 45: NEGATIVE
High risk HPV: POSITIVE — AB

## 2020-11-01 LAB — HM DIABETES EYE EXAM

## 2020-11-02 ENCOUNTER — Telehealth: Payer: Self-pay

## 2020-11-02 NOTE — Telephone Encounter (Signed)
Patient aware of pap results.  °

## 2020-11-15 ENCOUNTER — Ambulatory Visit (HOSPITAL_COMMUNITY): Payer: Commercial Managed Care - PPO

## 2020-11-15 ENCOUNTER — Ambulatory Visit (HOSPITAL_COMMUNITY)
Admission: RE | Admit: 2020-11-15 | Discharge: 2020-11-15 | Disposition: A | Discharge status: Home / Self Care | Payer: Commercial Managed Care - PPO | Source: Ambulatory Visit | Attending: Family Medicine | Admitting: Family Medicine

## 2020-11-15 ENCOUNTER — Other Ambulatory Visit: Payer: Self-pay

## 2020-11-15 DIAGNOSIS — Z1231 Encounter for screening mammogram for malignant neoplasm of breast: Secondary | ICD-10-CM | POA: Diagnosis not present

## 2020-12-27 ENCOUNTER — Encounter: Payer: Self-pay | Admitting: Internal Medicine

## 2020-12-27 ENCOUNTER — Ambulatory Visit (HOSPITAL_COMMUNITY)
Admission: RE | Admit: 2020-12-27 | Discharge: 2020-12-27 | Disposition: A | Discharge status: Home / Self Care | Payer: Commercial Managed Care - PPO | Source: Ambulatory Visit | Attending: Internal Medicine | Admitting: Internal Medicine

## 2020-12-27 ENCOUNTER — Ambulatory Visit: Payer: Commercial Managed Care - PPO | Admitting: Internal Medicine

## 2020-12-27 ENCOUNTER — Other Ambulatory Visit: Payer: Self-pay

## 2020-12-27 VITALS — BP 168/82 | HR 75 | Temp 97.5°F | Ht 61.5 in | Wt 170.0 lb

## 2020-12-27 DIAGNOSIS — I10 Essential (primary) hypertension: Secondary | ICD-10-CM | POA: Diagnosis not present

## 2020-12-27 DIAGNOSIS — M25562 Pain in left knee: Secondary | ICD-10-CM | POA: Diagnosis not present

## 2020-12-27 DIAGNOSIS — M7989 Other specified soft tissue disorders: Secondary | ICD-10-CM | POA: Insufficient documentation

## 2020-12-27 DIAGNOSIS — M545 Low back pain, unspecified: Secondary | ICD-10-CM

## 2020-12-27 MED ORDER — METOPROLOL TARTRATE 25 MG PO TABS: 25.0000 mg | ORAL_TABLET | Freq: Two times a day (BID) | ORAL | 3 refills | Status: DC

## 2020-12-27 NOTE — Assessment & Plan Note (Signed)
BP Readings from Last 1 Encounters:  12/27/20 (!) 168/82   Uncontrolled, currently in Maxzide and Losartan Reports having back and leg pain since starting Losartan Back pain, leg edema and elevated BP - would check BMP to r/o AKI Advised to hold Losartan for now  Started Metoprolol 25 mg BID Counseled for compliance with the medications Advised DASH diet and moderate exercise/walking, at least 150 mins/week

## 2020-12-27 NOTE — Patient Instructions (Addendum)
Please start taking Metoprolol as prescribed.  Please do not take Losartan for now until we advise you to.  Please get X-ray of the knee done. Okay to apply heating pads and take Tylenol for pain. Back Exercises These exercises help to make your trunk and back strong. They also help to keep the lower back flexible. Doing these exercises can help to prevent back pain or lessen existing pain.  If you have back pain, try to do these exercises 2-3 times each day or as told by your doctor.  As you get better, do the exercises once each day. Repeat the exercises more often as told by your doctor.  To stop back pain from coming back, do the exercises once each day, or as told by your doctor. Exercises Single knee to chest Do these steps 3-5 times in a row for each leg: 1. Lie on your back on a firm bed or the floor with your legs stretched out. 2. Bring one knee to your chest. 3. Grab your knee or thigh with both hands and hold them it in place. 4. Pull on your knee until you feel a gentle stretch in your lower back or buttocks. 5. Keep doing the stretch for 10-30 seconds. 6. Slowly let go of your leg and straighten it. Pelvic tilt Do these steps 5-10 times in a row: 1. Lie on your back on a firm bed or the floor with your legs stretched out. 2. Bend your knees so they point up to the ceiling. Your feet should be flat on the floor. 3. Tighten your lower belly (abdomen) muscles to press your lower back against the floor. This will make your tailbone point up to the ceiling instead of pointing down to your feet or the floor. 4. Stay in this position for 5-10 seconds while you gently tighten your muscles and breathe evenly. Cat-cow Do these steps until your lower back bends more easily: 1. Get on your hands and knees on a firm surface. Keep your hands under your shoulders, and keep your knees under your hips. You may put padding under your knees. 2. Let your head hang down toward your chest.  Tighten (contract) the muscles in your belly. Point your tailbone toward the floor so your lower back becomes rounded like the back of a cat. 3. Stay in this position for 5 seconds. 4. Slowly lift your head. Let the muscles of your belly relax. Point your tailbone up toward the ceiling so your back forms a sagging arch like the back of a cow. 5. Stay in this position for 5 seconds.   Press-ups Do these steps 5-10 times in a row: 1. Lie on your belly (face-down) on the floor. 2. Place your hands near your head, about shoulder-width apart. 3. While you keep your back relaxed and keep your hips on the floor, slowly straighten your arms to raise the top half of your body and lift your shoulders. Do not use your back muscles. You may change where you place your hands in order to make yourself more comfortable. 4. Stay in this position for 5 seconds. 5. Slowly return to lying flat on the floor.   Bridges Do these steps 10 times in a row: 1. Lie on your back on a firm surface. 2. Bend your knees so they point up to the ceiling. Your feet should be flat on the floor. Your arms should be flat at your sides, next to your body. 3. Tighten your butt muscles  and lift your butt off the floor until your waist is almost as high as your knees. If you do not feel the muscles working in your butt and the back of your thighs, slide your feet 1-2 inches farther away from your butt. 4. Stay in this position for 3-5 seconds. 5. Slowly lower your butt to the floor, and let your butt muscles relax. If this exercise is too easy, try doing it with your arms crossed over your chest.   Belly crunches Do these steps 5-10 times in a row: 1. Lie on your back on a firm bed or the floor with your legs stretched out. 2. Bend your knees so they point up to the ceiling. Your feet should be flat on the floor. 3. Cross your arms over your chest. 4. Tip your chin a little bit toward your chest but do not bend your neck. 5. Tighten  your belly muscles and slowly raise your chest just enough to lift your shoulder blades a tiny bit off of the floor. Avoid raising your body higher than that, because it can put too much stress on your low back. 6. Slowly lower your chest and your head to the floor. Back lifts Do these steps 5-10 times in a row: 1. Lie on your belly (face-down) with your arms at your sides, and rest your forehead on the floor. 2. Tighten the muscles in your legs and your butt. 3. Slowly lift your chest off of the floor while you keep your hips on the floor. Keep the back of your head in line with the curve in your back. Look at the floor while you do this. 4. Stay in this position for 3-5 seconds. 5. Slowly lower your chest and your face to the floor. Contact a doctor if:  Your back pain gets a lot worse when you do an exercise.  Your back pain does not get better 2 hours after you exercise. If you have any of these problems, stop doing the exercises. Do not do them again unless your doctor says it is okay. Get help right away if:  You have sudden, very bad back pain. If this happens, stop doing the exercises. Do not do them again unless your doctor says it is okay. This information is not intended to replace advice given to you by your health care provider. Make sure you discuss any questions you have with your health care provider. Document Revised: 08/26/2018 Document Reviewed: 08/26/2018 Elsevier Patient Education  2021 [employer].

## 2020-12-27 NOTE — Assessment & Plan Note (Signed)
Check X-ray of the knee Unlikely related to Losartan, but held as patient had concern Tylenol PRN

## 2020-12-27 NOTE — Progress Notes (Signed)
Established Patient Office Visit  Subjective:  Patient ID: Kathleen Kirby, female    DOB: 09-Nov-1961  Age: 60 y.o. MRN: [id number]  CC:  Chief Complaint  Patient presents with  . Edema    Has noticed swelling in both knees and legs since starting Losartan x3 weeks ago.   . Back Pain    Has noticed she has been having lower back pain since starting Losartan x3 weeks ago.     HPI Kathleen Kirby is a 60 year old female with PMH of HTN, type 2 DM, HLD and chronic back pain who presents for evaluation of acute onset knee pain.  She c/o left knee pain and swelling for last 3-4 weeks, and relates it with starting Losartan. She has not been able to walk as much as she used to due to the pain. She has back pain as well, but denies any numbness, weakness or tingling in the feet. She denies flank pain, dysuria or hematuria.  Her blood pressure was 168/82 in the office today. She denies chest pain, dyspnea or palpitations.  Past Medical History:  Diagnosis Date  . Allergy   . Anxiety about health 07/20/2017  . Back pain   . Bilateral shoulder pain   . Diabetes mellitus without complication ([place])   . FH: CAD (coronary artery disease) 09/14/2014   Sibling with MI under age 8   . Hyperlipidemia   . Hypertension   . Metabolic syndrome X [id number]  . Mild dysplasia of cervix 12/21/2013  . Obesity   . Right hip pain     History reviewed. No pertinent surgical history.  Family History  Problem Relation Age of Onset  . Heart disease Mother   . Coronary artery disease Father   . Heart disease Father   . Lung cancer Brother   . Brain cancer Brother   . Diabetes Sister   . Coronary artery disease Sister   . Hypertension Sister     Social History   Socioeconomic History  . Marital status: Married    Spouse name: Not on file  . Number of children: 0  . Years of education: Not on file  . Highest education level: Not on file  Occupational History  . 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 Smoking status: Never Smoker  . Smokeless tobacco: Never Used  Vaping Use  . Vaping Use: Never used  Substance and Sexual Activity  . Alcohol use: No  . Drug use: No  . Sexual activity: Yes    Partners: Male    Birth control/protection: None  Other Topics Concern  . Not on file  Social History Narrative  . Not on file   Social Determinants of Health   Financial Resource Strain: Not on file  Food Insecurity: Not on file  Transportation Needs: Not on file  Physical Activity: Not on file  Stress: Not on file  Social Connections: Not on file  Intimate Partner Violence: Not on file    Outpatient Medications Prior to Visit  Medication Sig Dispense Refill  . losartan (COZAAR) 50 MG tablet Take 1 tablet (50 mg total) by mouth daily. 90 tablet 1  . Multiple Minerals-Vitamins (CALCIUM & VIT D3 BONE HEALTH PO) Take by mouth.    . Potassium 99 MG TABS Take by mouth.    . pravastatin (PRAVACHOL) 10 MG tablet Take 1 tablet (10 mg total) by mouth daily. 90 tablet 3  . triamterene-hydrochlorothiazide (MAXZIDE) 75-50  MG tablet TAKE 1 TABLET BY MOUTH  DAILY 90 tablet 3   No facility-administered medications prior to visit.    Allergies  Allergen Reactions  . Amoxicillin   . Codeine   . Lipitor [Atorvastatin]     Cramps, tiredness   . Meloxicam Other (See Comments)    Reports sores on tongue, stiff neck  . Sulfamethoxazole-Trimethoprim     ROS Review of Systems  Constitutional: Positive for fatigue. Negative for chills and fever.  HENT: Negative for congestion, sinus pressure, sinus pain and sore throat.   Eyes: Negative for pain and discharge.  Respiratory: Negative for cough and shortness of breath.   Cardiovascular: Negative for chest pain and palpitations.  Gastrointestinal: Negative for abdominal pain, constipation, diarrhea, nausea and vomiting.  Endocrine: Negative for polydipsia and polyuria.  Genitourinary: Negative for dysuria  and hematuria.  Musculoskeletal: Positive for arthralgias (Left knee pain with swelling) and back pain. Negative for neck pain and neck stiffness.  Skin: Negative for rash.  Neurological: Negative for dizziness and weakness.  Psychiatric/Behavioral: Negative for agitation and behavioral problems.      Objective:    Physical Exam Vitals reviewed.  Constitutional:      General: She is not in acute distress.    Appearance: She is not diaphoretic.  HENT:     Head: Normocephalic and atraumatic.     Nose: Nose normal. No congestion.     Mouth/Throat:     Mouth: Mucous membranes are moist.     Pharynx: No posterior oropharyngeal erythema.  Eyes:     General: No scleral icterus.    Extraocular Movements: Extraocular movements intact.     Pupils: Pupils are equal, round, and reactive to light.  Cardiovascular:     Rate and Rhythm: Normal rate and regular rhythm.     Pulses: Normal pulses.     Heart sounds: Normal heart sounds. No murmur heard.   Pulmonary:     Breath sounds: Normal breath sounds. No wheezing or rales.  Abdominal:     Palpations: Abdomen is soft.     Tenderness: There is no abdominal tenderness.  Musculoskeletal:        General: Swelling (Left knee, no erythema or warmth) present.     Cervical back: Neck supple. No tenderness.     Right lower leg: No edema.  Skin:    General: Skin is warm.     Findings: No rash.  Neurological:     General: No focal deficit present.     Mental Status: She is alert and oriented to person, place, and time.     Sensory: No sensory deficit.     Motor: No weakness.  Psychiatric:        Mood and Affect: Mood normal.        Behavior: Behavior normal.     BP (!) 168/82 (BP Location: Left Arm, Patient Position: Sitting, Cuff Size: Normal)   Pulse 75   Temp (!) 97.5 F (36.4 C) (Temporal)   Ht 5' 1.5" (1.562 m)   Wt 170 lb (77.1 kg)   LMP 11/27/2012   SpO2 99%   BMI 31.60 kg/m  Wt Readings from Last 3 Encounters:  12/27/20  170 lb (77.1 kg)  10/25/20 168 lb 1.3 oz (76.2 kg)  09/06/20 165 lb (74.8 kg)     Health Maintenance Due  Topic Date Due  . OPHTHALMOLOGY EXAM  04/10/2019    There are no preventive care reminders to display for this patient.  Lab  Results  Component Value Date   TSH 1.17 04/15/2019   Lab Results  Component Value Date   WBC 5.3 08/31/2020   HGB 12.1 08/31/2020   HCT 37.3 08/31/2020   MCV 78.2 (L) 08/31/2020   PLT 309 08/31/2020   Lab Results  Component Value Date   NA 138 08/31/2020   K 3.3 (L) 08/31/2020   CO2 30 08/31/2020   GLUCOSE 107 (H) 08/31/2020   BUN 10 08/31/2020   CREATININE 0.66 08/31/2020   BILITOT 0.3 08/31/2020   ALKPHOS 79 07/09/2017   AST 18 08/31/2020   ALT 11 08/31/2020   PROT 6.5 08/31/2020   ALBUMIN 4.0 07/09/2017   CALCIUM 9.6 08/31/2020   Lab Results  Component Value Date   CHOL 204 (H) 08/31/2020   Lab Results  Component Value Date   HDL 78 08/31/2020   Lab Results  Component Value Date   LDLCALC 113 (H) 08/31/2020   Lab Results  Component Value Date   TRIG 51 08/31/2020   Lab Results  Component Value Date   CHOLHDL 2.6 08/31/2020   Lab Results  Component Value Date   HGBA1C 7.1 (A) 10/25/2020      Assessment & Plan:   Problem List Items Addressed This Visit      Cardiovascular and Mediastinum   Essential hypertension - Primary    BP Readings from Last 1 Encounters:  12/27/20 (!) 168/82   Uncontrolled, currently in Maxzide and Losartan Reports having back and leg pain since starting Losartan Back pain, leg edema and elevated BP - would check BMP to r/o AKI Advised to hold Losartan for now  Started Metoprolol 25 mg BID Counseled for compliance with the medications Advised DASH diet and moderate exercise/walking, at least 150 mins/week       Relevant Medications   metoprolol tartrate (LOPRESSOR) 25 MG tablet   Other Relevant Orders   Basic Metabolic Panel (BMET)     Other   Low back pain    Likely  musculoskeletal in origin, does not seem to be flank pain Simple back exercises and Tylenol      Acute pain of left knee    Check X-ray of the knee Unlikely related to Losartan, but held as patient had concern Tylenol PRN      Relevant Orders   DG Knee Complete 4 Views Left      Meds ordered this encounter  Medications  . metoprolol tartrate (LOPRESSOR) 25 MG tablet    Sig: Take 1 tablet (25 mg total) by mouth 2 (two) times daily.    Dispense:  60 tablet    Refill:  3    Follow-up: Return if symptoms worsen or fail to improve.    Lindell Spar, MD

## 2020-12-27 NOTE — Assessment & Plan Note (Signed)
Likely musculoskeletal in origin, does not seem to be flank pain Simple back exercises and Tylenol

## 2020-12-28 LAB — BASIC METABOLIC PANEL
BUN/Creatinine Ratio: 12 (ref 9–23)
BUN: 9 mg/dL (ref 6–24)
CO2: 24 mmol/L (ref 20–29)
Calcium: 10 mg/dL (ref 8.7–10.2)
Chloride: 99 mmol/L (ref 96–106)
Creatinine, Ser: 0.78 mg/dL (ref 0.57–1.00)
GFR calc Af Amer: 96 mL/min/{1.73_m2} (ref >59)
GFR calc non Af Amer: 83 mL/min/{1.73_m2} (ref >59)
Glucose: 133 mg/dL — ABNORMAL HIGH (ref 65–99)
Potassium: 3.7 mmol/L (ref 3.5–5.2)
Sodium: 139 mmol/L (ref 134–144)

## 2020-12-31 ENCOUNTER — Telehealth: Payer: Self-pay

## 2020-12-31 NOTE — Telephone Encounter (Signed)
Pt informed, xray of knee was normal. Pt states right now it is doing a little better now.

## 2020-12-31 NOTE — Telephone Encounter (Signed)
Pt is questioning the xray that she had completed, she is not understanding the report on Mychart.  Please call the pt

## 2021-01-10 ENCOUNTER — Ambulatory Visit: Payer: Commercial Managed Care - PPO | Admitting: Internal Medicine

## 2021-01-10 ENCOUNTER — Encounter: Payer: Self-pay | Admitting: Internal Medicine

## 2021-01-10 ENCOUNTER — Ambulatory Visit (HOSPITAL_COMMUNITY)
Admission: RE | Admit: 2021-01-10 | Discharge: 2021-01-10 | Disposition: A | Discharge status: Home / Self Care | Payer: Commercial Managed Care - PPO | Source: Ambulatory Visit | Attending: Internal Medicine | Admitting: Internal Medicine

## 2021-01-10 ENCOUNTER — Other Ambulatory Visit: Payer: Self-pay

## 2021-01-10 VITALS — BP 169/90 | HR 79 | Temp 98.9°F | Resp 18 | Ht 61.5 in | Wt 175.4 lb

## 2021-01-10 DIAGNOSIS — M7989 Other specified soft tissue disorders: Secondary | ICD-10-CM | POA: Diagnosis present

## 2021-01-10 DIAGNOSIS — I10 Essential (primary) hypertension: Secondary | ICD-10-CM

## 2021-01-10 MED ORDER — AMLODIPINE BESYLATE 5 MG PO TABS: 5.0000 mg | ORAL_TABLET | Freq: Every day | ORAL | 1 refills | Status: DC

## 2021-01-10 NOTE — Progress Notes (Signed)
Acute Office Visit  Subjective:    Patient ID: Kathleen Kirby, female    DOB: 10/13/1961, 60 y.o.   MRN: [id number]  Chief Complaint  Patient presents with  . Follow-up    Legs and feet are still tingling also her left leg feels warm to touch on the back and has pain when she is extending her foot has been having problems since switching meds     HPI Patient is in today for evaluation of HTN and c/o numbness and tingling in the legs since starting Metoprolol. She was recently started on Metoprolol as she had elevated BP and she could not tolerate Losartan (LE swelling). She still c/o left LE swelling which is worse with flexion of the feet. She denies any prolonged sitting recently. She ambulates regularly.  Her BP was 169/90 today. She denies any chest pain, dyspnea or palpitations.She is currently on Maxzide, but she has stopped taking Losartan and Metoprolol. She had tolerated Amlodipine 5 mg in the past.   Past Medical History:  Diagnosis Date  . Allergy   . Anxiety about health 07/20/2017  . Back pain   . Bilateral shoulder pain   . Diabetes mellitus without complication ([place])   . FH: CAD (coronary artery disease) 09/14/2014   Sibling with MI under age 47   . Hyperlipidemia   . Hypertension   . Metabolic syndrome X 9/41/7408  . Mild dysplasia of cervix 12/21/2013  . Obesity   . Right hip pain     History reviewed. No pertinent surgical history.  Family History  Problem Relation Age of Onset  . Heart disease Mother   . Coronary artery disease Father   . Heart disease Father   . Lung cancer Brother   . Brain cancer Brother   . Diabetes Sister   . Coronary artery disease Sister   . Hypertension Sister     Social History   Socioeconomic History  . Marital status: Married    Spouse name: Not on file  . Number of children: 0  . Years of education: Not on file  . Highest education level: Not on file  Occupational History  . 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 Smoking status: Never Smoker  . Smokeless tobacco: Never Used  Vaping Use  . Vaping Use: Never used  Substance and Sexual Activity  . Alcohol use: No  . Drug use: No  . Sexual activity: Yes    Partners: Male    Birth control/protection: None  Other Topics Concern  . Not on file  Social History Narrative  . Not on file   Social Determinants of Health   Financial Resource Strain: Not on file  Food Insecurity: Not on file  Transportation Needs: Not on file  Physical Activity: Not on file  Stress: Not on file  Social Connections: Not on file  Intimate Partner Violence: Not on file    Outpatient Medications Prior to Visit  Medication Sig Dispense Refill  . Multiple Minerals-Vitamins (CALCIUM & VIT D3 BONE HEALTH PO) Take by mouth.    . Potassium 99 MG TABS Take by mouth.    . pravastatin (PRAVACHOL) 10 MG tablet Take 1 tablet (10 mg total) by mouth daily. 90 tablet 3  . triamterene-hydrochlorothiazide (MAXZIDE) 75-50 MG tablet TAKE 1 TABLET BY MOUTH  DAILY 90 tablet 3  . losartan (COZAAR) 50 MG tablet Take 1 tablet (50 mg total) by mouth daily. (Patient not  taking: Reported on 01/10/2021) 90 tablet 1  . metoprolol tartrate (LOPRESSOR) 25 MG tablet Take 1 tablet (25 mg total) by mouth 2 (two) times daily. (Patient not taking: Reported on 01/10/2021) 60 tablet 3   No facility-administered medications prior to visit.    Allergies  Allergen Reactions  . Amoxicillin   . Codeine   . Lipitor [Atorvastatin]     Cramps, tiredness   . Meloxicam Other (See Comments)    Reports sores on tongue, stiff neck  . Sulfamethoxazole-Trimethoprim     Review of Systems  Constitutional: Positive for fatigue. Negative for chills and fever.  HENT: Negative for congestion, sinus pressure, sinus pain and sore throat.   Eyes: Negative for pain and discharge.  Respiratory: Negative for cough and shortness of breath.   Cardiovascular: Negative for chest  pain and palpitations.  Gastrointestinal: Negative for abdominal pain, constipation, diarrhea, nausea and vomiting.  Endocrine: Negative for polydipsia and polyuria.  Genitourinary: Negative for dysuria and hematuria.  Musculoskeletal: Positive for arthralgias (Left knee pain with swelling) and back pain. Negative for neck pain and neck stiffness.  Skin: Negative for rash.  Neurological: Negative for dizziness and weakness.       Tingling in the legs  Psychiatric/Behavioral: Negative for agitation and behavioral problems.       Objective:    Physical Exam Vitals reviewed.  Constitutional:      General: She is not in acute distress.    Appearance: She is not diaphoretic.  HENT:     Head: Normocephalic and atraumatic.     Nose: Nose normal. No congestion.     Mouth/Throat:     Mouth: Mucous membranes are moist.     Pharynx: No posterior oropharyngeal erythema.  Eyes:     General: No scleral icterus.    Extraocular Movements: Extraocular movements intact.     Pupils: Pupils are equal, round, and reactive to light.  Cardiovascular:     Rate and Rhythm: Normal rate and regular rhythm.     Heart sounds: Normal heart sounds. No murmur heard.     Comments: DPA pulse intact Pulmonary:     Breath sounds: Normal breath sounds. No wheezing or rales.  Abdominal:     Palpations: Abdomen is soft.     Tenderness: There is no abdominal tenderness.  Musculoskeletal:        General: Swelling (Left knee, no erythema or warmth) present.     Cervical back: Neck supple. No tenderness.     Right lower leg: No edema.  Skin:    General: Skin is warm.     Findings: No rash.  Neurological:     General: No focal deficit present.     Mental Status: She is alert and oriented to person, place, and time.     Sensory: No sensory deficit.     Motor: No weakness.  Psychiatric:        Mood and Affect: Mood normal.        Behavior: Behavior normal.     BP (!) 169/90 (BP Location: Right Arm,  Patient Position: Sitting, Cuff Size: Normal)   Pulse 79   Temp 98.9 F (37.2 C) (Oral)   Resp 18   Ht 5' 1.5" (1.562 m)   Wt 175 lb 6.4 oz (79.6 kg)   LMP 11/27/2012   SpO2 97%   BMI 32.60 kg/m  Wt Readings from Last 3 Encounters:  01/10/21 175 lb 6.4 oz (79.6 kg)  12/27/20 170 lb (77.1 kg)  10/25/20 168 lb 1.3 oz (76.2 kg)    Health Maintenance Due  Topic Date Due  . OPHTHALMOLOGY EXAM  04/10/2019    There are no preventive care reminders to display for this patient.   Lab Results  Component Value Date   TSH 1.17 04/15/2019   Lab Results  Component Value Date   WBC 5.3 08/31/2020   HGB 12.1 08/31/2020   HCT 37.3 08/31/2020   MCV 78.2 (L) 08/31/2020   PLT 309 08/31/2020   Lab Results  Component Value Date   NA 139 12/27/2020   K 3.7 12/27/2020   CO2 24 12/27/2020   GLUCOSE 133 (H) 12/27/2020   BUN 9 12/27/2020   CREATININE 0.78 12/27/2020   BILITOT 0.3 08/31/2020   ALKPHOS 79 07/09/2017   AST 18 08/31/2020   ALT 11 08/31/2020   PROT 6.5 08/31/2020   ALBUMIN 4.0 07/09/2017   CALCIUM 10.0 12/27/2020   Lab Results  Component Value Date   CHOL 204 (H) 08/31/2020   Lab Results  Component Value Date   HDL 78 08/31/2020   Lab Results  Component Value Date   LDLCALC 113 (H) 08/31/2020   Lab Results  Component Value Date   TRIG 51 08/31/2020   Lab Results  Component Value Date   CHOLHDL 2.6 08/31/2020   Lab Results  Component Value Date   HGBA1C 7.1 (A) 10/25/2020       Assessment & Plan:   Problem List Items Addressed This Visit      Cardiovascular and Mediastinum   Essential hypertension - Primary    BP Readings from Last 1 Encounters:  01/10/21 (!) 169/90   Uncontrolled with Maxzide Restarted Amlodipine 5 mg QD as she had tolerated 5 mg. Has not been taking Losartan and Metoprolol due to possible adverse reaction Advised to contact with blood pressure readings from home Counseled for compliance with the medications Advised DASH  diet and moderate exercise/walking, at least 150 mins/week       Relevant Medications   amLODipine (NORVASC) 5 MG tablet     Other   Leg swelling    Reports worse pain with dorsiflexion Although she is ambulatory, would check US doppler LE to r/o DVT because of positive sign.      Relevant Orders   US Venous Img Lower Unilateral Left       Meds ordered this encounter  Medications  . amLODipine (NORVASC) 5 MG tablet    Sig: Take 1 tablet (5 mg total) by mouth daily.    Dispense:  90 tablet    Refill:  1     Daneshia Tavano Keith Rake, MD

## 2021-01-10 NOTE — Progress Notes (Signed)
us

## 2021-01-10 NOTE — Assessment & Plan Note (Addendum)
BP Readings from Last 1 Encounters:  01/10/21 (!) 169/90   Uncontrolled with Maxzide Restarted Amlodipine 5 mg QD as she had tolerated 5 mg. Has not been taking Losartan and Metoprolol due to possible adverse reaction Advised to contact with blood pressure readings from home Counseled for compliance with the medications Advised DASH diet and moderate exercise/walking, at least 150 mins/week

## 2021-01-10 NOTE — Patient Instructions (Signed)
Please start taking Amlodipine 5 mg once daily.  Please stop taking Metoprolol and Losartan for now.  Please get Ultrasound of the left leg done at [facility].

## 2021-01-10 NOTE — Assessment & Plan Note (Signed)
Reports worse pain with dorsiflexion Although she is ambulatory, would check US doppler LE to r/o DVT because of positive sign.

## 2021-01-14 ENCOUNTER — Telehealth (INDEPENDENT_AMBULATORY_CARE_PROVIDER_SITE_OTHER): Payer: Commercial Managed Care - PPO | Admitting: Family Medicine

## 2021-01-14 ENCOUNTER — Other Ambulatory Visit: Payer: Self-pay

## 2021-01-14 ENCOUNTER — Encounter: Payer: Self-pay | Admitting: Family Medicine

## 2021-01-14 VITALS — BP 142/82 | Ht 61.5 in | Wt 175.0 lb

## 2021-01-14 DIAGNOSIS — Z566 Other physical and mental strain related to work: Secondary | ICD-10-CM | POA: Diagnosis not present

## 2021-01-14 DIAGNOSIS — E669 Obesity, unspecified: Secondary | ICD-10-CM

## 2021-01-14 DIAGNOSIS — E785 Hyperlipidemia, unspecified: Secondary | ICD-10-CM

## 2021-01-14 DIAGNOSIS — E119 Type 2 diabetes mellitus without complications: Secondary | ICD-10-CM

## 2021-01-14 DIAGNOSIS — I10 Essential (primary) hypertension: Secondary | ICD-10-CM | POA: Diagnosis not present

## 2021-01-14 NOTE — Patient Instructions (Addendum)
F/U in 4.5  months, call if you need me before  Please get fasting lipid, cmp and EGFr and hBa1C the week of Feb 14  Please work on healthy lifestyle to control blood pressure and blood sugar  Nurse please get eye exam report from 10/2020, [facility]  Blood pressure is improving, continue current meds , f/u with Drpatel as scheduled    [email or web address]">  DASH Eating Plan DASH stands for Dietary Approaches to Stop Hypertension. The DASH eating plan is a healthy eating plan that has been shown to:  Reduce high blood pressure (hypertension).  Reduce your risk for type 2 diabetes, heart disease, and stroke.  Help with weight loss. What are tips for following this plan? Reading food labels  Check food labels for the amount of salt (sodium) per serving. Choose foods with less than 5 percent of the Daily Value of sodium. Generally, foods with less than 300 milligrams (mg) of sodium per serving fit into this eating plan.  To find whole grains, look for the word "whole" as the first word in the ingredient list. Shopping  Buy products labeled as "low-sodium" or "no salt added."  Buy fresh foods. Avoid canned foods and pre-made or frozen meals. Cooking  Avoid adding salt when cooking. Use salt-free seasonings or herbs instead of table salt or sea salt. Check with your health care provider or pharmacist before using salt substitutes.  Do not fry foods. Cook foods using healthy methods such as baking, boiling, grilling, roasting, and broiling instead.  Cook with heart-healthy oils, such as olive, canola, avocado, soybean, or sunflower oil. Meal planning  Eat a balanced diet that includes: ? 4 or more servings of fruits and 4 or more servings of vegetables each day. Try to fill one-half of your plate with fruits and vegetables. ? 6-8 servings of whole grains each day. ? Less than 6 oz (170 g) of lean meat, poultry, or fish each day. A  3-oz (85-g) serving of meat is about the same size as a deck of cards. One egg equals 1 oz (28 g). ? 2-3 servings of low-fat dairy each day. One serving is 1 cup (237 mL). ? 1 serving of nuts, seeds, or beans 5 times each week. ? 2-3 servings of heart-healthy fats. Healthy fats called omega-3 fatty acids are found in foods such as walnuts, flaxseeds, fortified milks, and eggs. These fats are also found in cold-water fish, such as sardines, salmon, and mackerel.  Limit how much you eat of: ? Canned or prepackaged foods. ? Food that is high in trans fat, such as some fried foods. ? Food that is high in saturated fat, such as fatty meat. ? Desserts and other sweets, sugary drinks, and other foods with added sugar. ? Full-fat dairy products.  Do not salt foods before eating.  Do not eat more than 4 egg yolks a week.  Try to eat at least 2 vegetarian meals a week.  Eat more home-cooked food and less restaurant, buffet, and fast food.   Lifestyle  When eating at a restaurant, ask that your food be prepared with less salt or no salt, if possible.  If you drink alcohol: ? Limit how much you use to:  0-1 drink a day for women who are not pregnant.  0-2 drinks a day for men. ? Be aware of how much alcohol is in your drink. In the [place] one drink equals one 12 oz bottle of beer (355 mL), one 5 oz glass of  wine (148 mL), or one 1 oz glass of hard liquor (44 mL). General information  Avoid eating more than 2,300 mg of salt a day. If you have hypertension, you may need to reduce your sodium intake to 1,500 mg a day.  Work with your health care provider to maintain a healthy body weight or to lose weight. Ask what an ideal weight is for you.  Get at least 30 minutes of exercise that causes your heart to beat faster (aerobic exercise) most days of the week. Activities may include walking, swimming, or biking.  Work with your health care provider or dietitian to adjust your eating plan to your  individual calorie needs. What foods should I eat? Fruits All fresh, dried, or frozen fruit. Canned fruit in natural juice (without added sugar). Vegetables Fresh or frozen vegetables (raw, steamed, roasted, or grilled). Low-sodium or reduced-sodium tomato and vegetable juice. Low-sodium or reduced-sodium tomato sauce and tomato paste. Low-sodium or reduced-sodium canned vegetables. Grains Whole-grain or whole-wheat bread. Whole-grain or whole-wheat pasta. Brown rice. [place]. Bulgur. Whole-grain and low-sodium cereals. Pita bread. Low-fat, low-sodium crackers. Whole-wheat flour tortillas. Meats and other proteins Skinless chicken or [place]. Ground chicken or [place]. Pork with fat trimmed off. Fish and seafood. Egg whites. Dried beans, peas, or lentils. Unsalted nuts, nut butters, and seeds. Unsalted canned beans. Lean cuts of beef with fat trimmed off. Low-sodium, lean precooked or cured meat, such as sausages or meat loaves. Dairy Low-fat (1%) or fat-free (skim) milk. Reduced-fat, low-fat, or fat-free cheeses. Nonfat, low-sodium ricotta or cottage cheese. Low-fat or nonfat yogurt. Low-fat, low-sodium cheese. Fats and oils Soft margarine without trans fats. Vegetable oil. Reduced-fat, low-fat, or light mayonnaise and salad dressings (reduced-sodium). Canola, safflower, olive, avocado, soybean, and sunflower oils. Avocado. Seasonings and condiments Herbs. Spices. Seasoning mixes without salt. Other foods Unsalted popcorn and pretzels. Fat-free sweets. The items listed above may not be a complete list of foods and beverages you can eat. Contact a dietitian for more information. What foods should I avoid? Fruits Canned fruit in a light or heavy syrup. Fried fruit. Fruit in cream or butter sauce. Vegetables Creamed or fried vegetables. Vegetables in a cheese sauce. Regular canned vegetables (not low-sodium or reduced-sodium). Regular canned tomato sauce and paste (not low-sodium or  reduced-sodium). Regular tomato and vegetable juice (not low-sodium or reduced-sodium). Angie Fava. Olives. Grains Baked goods made with fat, such as croissants, muffins, or some breads. Dry pasta or rice meal packs. Meats and other proteins Fatty cuts of meat. Ribs. Fried meat. Berniece Salines. Bologna, salami, and other precooked or cured meats, such as sausages or meat loaves. Fat from the back of a pig (fatback). Bratwurst. Salted nuts and seeds. Canned beans with added salt. Canned or smoked fish. Whole eggs or egg yolks. Chicken or [place] with skin. Dairy Whole or 2% milk, cream, and half-and-half. Whole or full-fat cream cheese. Whole-fat or sweetened yogurt. Full-fat cheese. Nondairy creamers. Whipped toppings. Processed cheese and cheese spreads. Fats and oils Butter. Stick margarine. Lard. Shortening. Ghee. Bacon fat. Tropical oils, such as coconut, palm kernel, or palm oil. Seasonings and condiments Onion salt, garlic salt, seasoned salt, table salt, and sea salt. Worcestershire sauce. Tartar sauce. Barbecue sauce. Teriyaki sauce. Soy sauce, including reduced-sodium. Steak sauce. Canned and packaged gravies. Fish sauce. Oyster sauce. Cocktail sauce. Store-bought horseradish. Ketchup. Mustard. Meat flavorings and tenderizers. Bouillon cubes. Hot sauces. Pre-made or packaged marinades. Pre-made or packaged taco seasonings. Relishes. Regular salad dressings. Other foods Salted popcorn and pretzels. The items  listed above may not be a complete list of foods and beverages you should avoid. Contact a dietitian for more information. Where to find more information  National Heart, Lung, and Blood Institute: https://wilson-eaton.com/  American Heart Association: www.heart.org  Academy of Nutrition and Dietetics: www.eatright.[place]: www.kidney.org Summary  The DASH eating plan is a healthy eating plan that has been shown to reduce high blood pressure (hypertension). It may also reduce  your risk for type 2 diabetes, heart disease, and stroke.  When on the DASH eating plan, aim to eat more fresh fruits and vegetables, whole grains, lean proteins, low-fat dairy, and heart-healthy fats.  With the DASH eating plan, you should limit salt (sodium) intake to 2,300 mg a day. If you have hypertension, you may need to reduce your sodium intake to 1,500 mg a day.  Work with your health care provider or dietitian to adjust your eating plan to your individual calorie needs. This information is not intended to replace advice given to you by your health care provider. Make sure you discuss any questions you have with your health care provider. Document Revised: 11/04/2019 Document Reviewed: 11/04/2019 Elsevier Patient Education  2021 [employer].

## 2021-01-14 NOTE — Assessment & Plan Note (Signed)
Improved, using coping strategies of

## 2021-01-14 NOTE — Assessment & Plan Note (Signed)
  Patient re-educated about  the importance of commitment to a  minimum of 150 minutes of exercise per week as able.  The importance of healthy food choices with portion control discussed, as well as eating regularly and within a 12 hour window most days. The need to choose "clean , green" food 50 to 75% of the time is discussed, as well as to make water the primary drink and set a goal of 64 ounces water daily.    Weight /BMI 01/14/2021 01/10/2021 12/27/2020  WEIGHT 175 lb 175 lb 6.4 oz 170 lb  HEIGHT 5' 1.5" 5' 1.5" 5' 1.5"  BMI 32.53 kg/m2 32.6 kg/m2 31.6 kg/m2

## 2021-01-14 NOTE — Assessment & Plan Note (Signed)
Improved but still not at goal DASH diet and commitment to daily physical activity for a minimum of 30 minutes discussed and encouraged, as a part of hypertension management. The importance of attaining a healthy weight is also discussed.  BP/Weight 01/14/2021 01/10/2021 12/27/2020 10/25/2020 09/06/2020 0/0/7622 05/17/3353  Systolic BP 562 563 893 734 287 681 157  Diastolic BP 82 90 82 83 77 78 74  Wt. (Lbs) 175 175.4 170 168.08 165 176 172.08  BMI 32.53 32.6 31.6 31.76 30.18 32.19 31.99

## 2021-01-14 NOTE — Progress Notes (Signed)
Virtual Visit via Telephone Note  I connected with Claudina Lick on 01/14/21 at  4:00 PM EST by telephone and verified that I am speaking with the correct person using two identifiers.  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 I also discussed with the patient that there may be a patient responsible charge related to this service. The patient expressed understanding and agreed to proceed.   History of Present Illness: F/U chronic problems in particular diabetes and hypertensionand address any new or current concerns. Review and update medications and allergies. Review recent lab and radiologic data .x ray and [place] of leg negative Update routine health maintainace. Review an encourage improved health habits to include nutrition, exercise and  sleep . Denies recent fever or chills. Denies sinus pressure, nasal congestion, ear pain or sore throat. Denies chest congestion, productive cough or wheezing. Denies chest pains, palpitations and leg swelling Denies abdominal pain, nausea, vomiting,diarrhea or constipation.   Denies dysuria, frequency, hesitancy or incontinence. Denies joint pain, swelling and limitation in mobility. Denies headaches, seizures, numbness, or tingling. Denies depression, anxiety or insomnia. Denies skin break down or rash.         Observations/Objective: BP (!) 142/82   Ht 5' 1.5" (1.562 m)   Wt 175 lb (79.4 kg)   LMP 11/27/2012   BMI 32.53 kg/m  Good communication with no confusion and intact memory. Alert and oriented x 3 No signs of respiratory distress during speech    Assessment and Plan: Type 2 diabetes, diet controlled ([place]) Ms. Babson is reminded of the importance of commitment to daily physical activity for 30 minutes or more, as able and the need to limit  carbohydrate intake to 30 to 60 grams per meal to help with blood sugar control.   Ms. Ruland is reminded of the importance of daily foot exam, annual eye examination, and good blood sugar, blood pressure and cholesterol control. Updated lab needed .   Diabetic Labs Latest Ref Rng & Units 12/27/2020 10/25/2020 09/06/2020 08/31/2020 03/09/2020  HbA1c 4.0 - 5.6 % - 7.1(A) - 6.8(H) 7.2(H)  Microalbumin Not Estab. ug/mL - - <3.0(H) - -  Micro/Creat Ratio <30 mcg/mg creat - - - - -  Chol <200 mg/dL - - - 204(H) 204(H)  HDL > OR = 50 mg/dL - - - 78 73  Calc LDL mg/dL (calc) - - - 113(H) 117(H)  Triglycerides <150 mg/dL - - - 51 52  Creatinine 0.57 - 1.00 mg/dL 0.78 - - 0.66 0.70   BP/Weight 01/14/2021 01/10/2021 12/27/2020 10/25/2020 09/06/2020 02/16/3298 01/18/2682  Systolic BP 419 622 297 989 211 941 740  Diastolic BP 82 90 82 83 77 78 74  Wt. (Lbs) 175 175.4 170 168.08 165 176 172.08  BMI 32.53 32.6 31.6 31.76 30.18 32.19 31.99   Foot/eye exam completion dates 10/25/2020 10/27/2019  Foot Form Completion Done Done      Updated lab needed at/ before next visit.   Essential hypertension Improved but still not at goal DASH diet and commitment to daily physical activity for a minimum of 30 minutes discussed and encouraged, as a part of hypertension management. The importance of attaining a healthy weight is also discussed.  BP/Weight 01/14/2021 01/10/2021 12/27/2020 10/25/2020 09/06/2020 07/15/4480 07/19/6313  Systolic BP 970 263 785 885 027 741 287  Diastolic BP 82 90 82 83 77 78  74  Wt. (Lbs) 175 175.4 170 168.08 165 176 172.08  BMI 32.53 32.6 31.6 31.76 30.18 32.19 31.99       Hyperlipidemia LDL goal <100 Hyperlipidemia:Low fat diet discussed and encouraged.   Lipid Panel  Lab Results  Component Value Date   CHOL 204 (H) 08/31/2020   HDL 78 08/31/2020   LDLCALC 113 (H) 08/31/2020   TRIG 51 08/31/2020   CHOLHDL 2.6 08/31/2020     Uncontrolled Updated lab needed at/ before next  visit.   Stress at work Improved, using coping strategies of   Obesity (BMI 30.0-34.9)  Patient re-educated about  the importance of commitment to a  minimum of 150 minutes of exercise per week as able.  The importance of healthy food choices with portion control discussed, as well as eating regularly and within a 12 hour window most days. The need to choose "clean , green" food 50 to 75% of the time is discussed, as well as to make water the primary drink and set a goal of 64 ounces water daily.    Weight /BMI 01/14/2021 01/10/2021 12/27/2020  WEIGHT 175 lb 175 lb 6.4 oz 170 lb  HEIGHT 5' 1.5" 5' 1.5" 5' 1.5"  BMI 32.53 kg/m2 32.6 kg/m2 31.6 kg/m2        Follow Up Instructions:    I discussed the assessment and treatment plan with the patient. The patient was provided an opportunity to ask questions and all were answered. The patient agreed with the plan and demonstrated an understanding of the instructions.   The patient was advised to call back or seek an in-person evaluation if the symptoms worsen or if the condition fails to improve as anticipated.  I provided 22 minutes of non-face-to-face time during this encounter.   Tula Nakayama, MD

## 2021-01-14 NOTE — Assessment & Plan Note (Addendum)
Kathleen Kirby is reminded of the importance of commitment to daily physical activity for 30 minutes or more, as able and the need to limit carbohydrate intake to 30 to 60 grams per meal to help with blood sugar control.   Kathleen Kirby is reminded of the importance of daily foot exam, annual eye examination, and good blood sugar, blood pressure and cholesterol control. Updated lab needed .   Diabetic Labs Latest Ref Rng & Units 12/27/2020 10/25/2020 09/06/2020 08/31/2020 03/09/2020  HbA1c 4.0 - 5.6 % - 7.1(A) - 6.8(H) 7.2(H)  Microalbumin Not Estab. ug/mL - - <3.0(H) - -  Micro/Creat Ratio <30 mcg/mg creat - - - - -  Chol <200 mg/dL - - - 204(H) 204(H)  HDL > OR = 50 mg/dL - - - 78 73  Calc LDL mg/dL (calc) - - - 113(H) 117(H)  Triglycerides <150 mg/dL - - - 51 52  Creatinine 0.57 - 1.00 mg/dL 0.78 - - 0.66 0.70   BP/Weight 01/14/2021 01/10/2021 12/27/2020 10/25/2020 09/06/2020 04/16/9766 02/16/1936  Systolic BP 902 409 735 329 924 268 341  Diastolic BP 82 90 82 83 77 78 74  Wt. (Lbs) 175 175.4 170 168.08 165 176 172.08  BMI 32.53 32.6 31.6 31.76 30.18 32.19 31.99   Foot/eye exam completion dates 10/25/2020 10/27/2019  Foot Form Completion Done Done      Updated lab needed at/ before next visit.

## 2021-01-14 NOTE — Assessment & Plan Note (Signed)
Hyperlipidemia:Low fat diet discussed and encouraged.   Lipid Panel  Lab Results  Component Value Date   CHOL 204 (H) 08/31/2020   HDL 78 08/31/2020   LDLCALC 113 (H) 08/31/2020   TRIG 51 08/31/2020   CHOLHDL 2.6 08/31/2020     Uncontrolled Updated lab needed at/ before next visit.

## 2021-01-31 ENCOUNTER — Ambulatory Visit: Payer: Commercial Managed Care - PPO | Admitting: Internal Medicine

## 2021-01-31 ENCOUNTER — Encounter: Payer: Self-pay | Admitting: Internal Medicine

## 2021-01-31 ENCOUNTER — Other Ambulatory Visit: Payer: Self-pay

## 2021-01-31 VITALS — BP 164/80 | HR 92 | Resp 18 | Ht 61.0 in | Wt 174.8 lb

## 2021-01-31 DIAGNOSIS — M62838 Other muscle spasm: Secondary | ICD-10-CM | POA: Diagnosis not present

## 2021-01-31 DIAGNOSIS — I10 Essential (primary) hypertension: Secondary | ICD-10-CM | POA: Diagnosis not present

## 2021-01-31 DIAGNOSIS — M7989 Other specified soft tissue disorders: Secondary | ICD-10-CM | POA: Diagnosis not present

## 2021-01-31 MED ORDER — CYCLOBENZAPRINE HCL 5 MG PO TABS: 10.0000 mg | ORAL_TABLET | Freq: Every day | ORAL | 0 refills | Status: DC

## 2021-01-31 MED ORDER — LABETALOL HCL 100 MG PO TABS: 100.0000 mg | ORAL_TABLET | Freq: Two times a day (BID) | ORAL | 0 refills | Status: DC

## 2021-01-31 MED ORDER — FUROSEMIDE 20 MG PO TABS: 20.0000 mg | ORAL_TABLET | Freq: Every day | ORAL | 0 refills | Status: DC | PRN

## 2021-01-31 NOTE — Progress Notes (Signed)
Acute Office Visit  Subjective:    Patient ID: Kathleen Kirby, female    DOB: 1961-02-14, 60 y.o.   MRN: [id number]  Chief Complaint  Patient presents with  . Leg Swelling    Legs are still swelling and still hurting the left leg is swelling more swells most of the day and hurts when she gets home and takes something the swelling goes down     HPI Patient is in today for evaluation of b/l leg swelling, left > right and follow up of HTN.  She c/o leg swelling, mostly around the ankles. She states that swelling is worse after 12 hour shifts and gets better after rest. She tries to keep legs elevated, which helps with the swelling. She mentions that she has so much swelling at times that it hurts. She also c/o left knee pain, which is intermittent, worse with standing and is located in the back of the knee. She had US doppler done to r/o DVT. X-ray of the knee was negative for any arthritic changes.  Her BP is still elevated. She was placed on Losartan in the past, which she did not tolerate. She had worse ankle swelling with increased dose of Amlodipine. She checked her BP at home and ranges around 140/80. She denies any chest pain, dyspnea or palpitations.  Past Medical History:  Diagnosis Date  . Allergy   . Anxiety about health 07/20/2017  . Back pain   . Bilateral shoulder pain   . Diabetes mellitus without complication ([place])   . FH: CAD (coronary artery disease) 09/14/2014   Sibling with MI under age 24   . Hyperlipidemia   . Hypertension   . Metabolic syndrome X 01/08/5808  . Mild dysplasia of cervix 12/21/2013  . Obesity   . Right hip pain     History reviewed. No pertinent surgical history.  Family History  Problem Relation Age of Onset  . Heart disease Mother   . Coronary artery disease Father   . Heart disease Father   . Lung cancer Brother   . Brain cancer Brother   . Diabetes Sister   . Coronary artery disease Sister   . Hypertension Sister     Social History    Socioeconomic History  . Marital status: Married    Spouse name: Not on file  . Number of children: 0  . Years of education: Not on file  . Highest education level: Not on file  Occupational History  . Occupation: Glass blower/designer    Comment: [employer]  Tobacco Use  . Smoking status: Never Smoker  . Smokeless tobacco: Never Used  Vaping Use  . Vaping Use: Never used  Substance and Sexual Activity  . Alcohol use: No  . Drug use: No  . Sexual activity: Yes    Partners: Male    Birth control/protection: None  Other Topics Concern  . Not on file  Social History Narrative  . Not on file   Social Determinants of Health   Financial Resource Strain: Not on file  Food Insecurity: Not on file  Transportation Needs: Not on file  Physical Activity: Not on file  Stress: Not on file  Social Connections: Not on file  Intimate Partner Violence: Not on file    Outpatient Medications Prior to Visit  Medication Sig Dispense Refill  . amLODipine (NORVASC) 5 MG tablet Take 1 tablet (5 mg total) by mouth daily. 90 tablet 1  . aspirin EC 81 MG tablet Take  81 mg by mouth daily. Swallow whole.    . Multiple Minerals-Vitamins (CALCIUM & VIT D3 BONE HEALTH PO) Take by mouth.    . Potassium 99 MG TABS Take by mouth.    . pravastatin (PRAVACHOL) 10 MG tablet Take 1 tablet (10 mg total) by mouth daily. 90 tablet 3  . triamterene-hydrochlorothiazide (MAXZIDE) 75-50 MG tablet TAKE 1 TABLET BY MOUTH  DAILY 90 tablet 3   No facility-administered medications prior to visit.    Allergies  Allergen Reactions  . Amoxicillin   . Codeine   . Lipitor [Atorvastatin]     Cramps, tiredness   . Meloxicam Other (See Comments)    Reports sores on tongue, stiff neck  . Sulfamethoxazole-Trimethoprim     Review of Systems  Constitutional: Positive for fatigue. Negative for chills and fever.  HENT: Negative for congestion, sinus pressure, sinus pain and sore throat.   Eyes: Negative for pain and  discharge.  Respiratory: Negative for cough and shortness of breath.   Cardiovascular: Negative for chest pain and palpitations.  Gastrointestinal: Negative for abdominal pain, constipation, diarrhea, nausea and vomiting.  Endocrine: Negative for polydipsia and polyuria.  Genitourinary: Negative for dysuria and hematuria.  Musculoskeletal: Positive for arthralgias (Left knee pain) and back pain. Negative for neck pain and neck stiffness.  Skin: Negative for rash.  Neurological: Negative for dizziness and weakness.  Psychiatric/Behavioral: Negative for agitation and behavioral problems.       Objective:    Physical Exam  BP (!) 164/80 (BP Location: Right Arm, Patient Position: Sitting, Cuff Size: Normal)   Pulse 92   Resp 18   Ht 5\' 1"  (1.549 m)   Wt 174 lb 12.8 oz (79.3 kg)   LMP 11/27/2012   SpO2 95%   BMI 33.03 kg/m  Wt Readings from Last 3 Encounters:  01/31/21 174 lb 12.8 oz (79.3 kg)  01/14/21 175 lb (79.4 kg)  01/10/21 175 lb 6.4 oz (79.6 kg)    There are no preventive care reminders to display for this patient.  There are no preventive care reminders to display for this patient.   Lab Results  Component Value Date   TSH 1.17 04/15/2019   Lab Results  Component Value Date   WBC 5.3 08/31/2020   HGB 12.1 08/31/2020   HCT 37.3 08/31/2020   MCV 78.2 (L) 08/31/2020   PLT 309 08/31/2020   Lab Results  Component Value Date   NA 139 12/27/2020   K 3.7 12/27/2020   CO2 24 12/27/2020   GLUCOSE 133 (H) 12/27/2020   BUN 9 12/27/2020   CREATININE 0.78 12/27/2020   BILITOT 0.3 08/31/2020   ALKPHOS 79 07/09/2017   AST 18 08/31/2020   ALT 11 08/31/2020   PROT 6.5 08/31/2020   ALBUMIN 4.0 07/09/2017   CALCIUM 10.0 12/27/2020   Lab Results  Component Value Date   CHOL 204 (H) 08/31/2020   Lab Results  Component Value Date   HDL 78 08/31/2020   Lab Results  Component Value Date   LDLCALC 113 (H) 08/31/2020   Lab Results  Component Value Date   TRIG  51 08/31/2020   Lab Results  Component Value Date   CHOLHDL 2.6 08/31/2020   Lab Results  Component Value Date   HGBA1C 7.1 (A) 10/25/2020       Assessment & Plan:   Problem List Items Addressed This Visit      Cardiovascular and Mediastinum   Essential hypertension - Primary BP Readings from Last 1  Encounters:  01/31/21 (!) 164/80   Uncontrolled with Amlodipine 5 mg QD and Maxzide 75-50 mg QD Added Labetalol 100 mg BID Counseled for compliance with the medications Advised DASH diet and moderate exercise/walking, at least 150 mins/week    Relevant Medications   furosemide (LASIX) 20 MG tablet   labetalol (NORMODYNE) 100 MG tablet    Other Visit Diagnoses    Leg swelling    Appears to be related to prolonged standing - venous insufficiency Leg elevation Compression stockings Lasix PRN for resistant swelling    Relevant Medications   cyclobenzaprine (FLEXERIL) 5 MG tablet   furosemide (LASIX) 20 MG tablet    Muscle spasm  Knee pain likely due to muscle strain Prolonged standing at work making pain worse Flexeril PRN      Relevant Medications   cyclobenzaprine (FLEXERIL) 5 MG tablet       Meds ordered this encounter  Medications  . cyclobenzaprine (FLEXERIL) 5 MG tablet    Sig: Take 2 tablets (10 mg total) by mouth at bedtime.    Dispense:  20 tablet    Refill:  0  . furosemide (LASIX) 20 MG tablet    Sig: Take 1 tablet (20 mg total) by mouth daily as needed for fluid or edema.    Dispense:  30 tablet    Refill:  0  . labetalol (NORMODYNE) 100 MG tablet    Sig: Take 1 tablet (100 mg total) by mouth 2 (two) times daily.    Dispense:  60 tablet    Refill:  0     Adalee Kathan Keith Rake, MD

## 2021-01-31 NOTE — Patient Instructions (Addendum)
Please start taking Labetalol for hypertension.  Please take Furosemide for severe leg swelling only. Try to keep legs elevated when possible and use compression stocking.  Please take Cyclobenzaprine as needed at nighttime for muscle spasms.  DASH Eating Plan DASH stands for Dietary Approaches to Stop Hypertension. The DASH eating plan is a healthy eating plan that has been shown to:  Reduce high blood pressure (hypertension).  Reduce your risk for type 2 diabetes, heart disease, and stroke.  Help with weight loss. What are tips for following this plan? Reading food labels  Check food labels for the amount of salt (sodium) per serving. Choose foods with less than 5 percent of the Daily Value of sodium. Generally, foods with less than 300 milligrams (mg) of sodium per serving fit into this eating plan.  To find whole grains, look for the word "whole" as the first word in the ingredient list. Shopping  Buy products labeled as "low-sodium" or "no salt added."  Buy fresh foods. Avoid canned foods and pre-made or frozen meals. Cooking  Avoid adding salt when cooking. Use salt-free seasonings or herbs instead of table salt or sea salt. Check with your health care provider or pharmacist before using salt substitutes.  Do not fry foods. Cook foods using healthy methods such as baking, boiling, grilling, roasting, and broiling instead.  Cook with heart-healthy oils, such as olive, canola, avocado, soybean, or sunflower oil. Meal planning  Eat a balanced diet that includes: ? 4 or more servings of fruits and 4 or more servings of vegetables each day. Try to fill one-half of your plate with fruits and vegetables. ? 6-8 servings of whole grains each day. ? Less than 6 oz (170 g) of lean meat, poultry, or fish each day. A 3-oz (85-g) serving of meat is about the same size as a deck of cards. One egg equals 1 oz (28 g). ? 2-3 servings of low-fat dairy each day. One serving is 1 cup (237  mL). ? 1 serving of nuts, seeds, or beans 5 times each week. ? 2-3 servings of heart-healthy fats. Healthy fats called omega-3 fatty acids are found in foods such as walnuts, flaxseeds, fortified milks, and eggs. These fats are also found in cold-water fish, such as sardines, salmon, and mackerel.  Limit how much you eat of: ? Canned or prepackaged foods. ? Food that is high in trans fat, such as some fried foods. ? Food that is high in saturated fat, such as fatty meat. ? Desserts and other sweets, sugary drinks, and other foods with added sugar. ? Full-fat dairy products.  Do not salt foods before eating.  Do not eat more than 4 egg yolks a week.  Try to eat at least 2 vegetarian meals a week.  Eat more home-cooked food and less restaurant, buffet, and fast food.   Lifestyle  When eating at a restaurant, ask that your food be prepared with less salt or no salt, if possible.  If you drink alcohol: ? Limit how much you use to:  0-1 drink a day for women who are not pregnant.  0-2 drinks a day for men. ? Be aware of how much alcohol is in your drink. In the [place] one drink equals one 12 oz bottle of beer (355 mL), one 5 oz glass of wine (148 mL), or one 1 oz glass of hard liquor (44 mL). General information  Avoid eating more than 2,300 mg of salt a day. If you have hypertension, you  may need to reduce your sodium intake to 1,500 mg a day.  Work with your health care provider to maintain a healthy body weight or to lose weight. Ask what an ideal weight is for you.  Get at least 30 minutes of exercise that causes your heart to beat faster (aerobic exercise) most days of the week. Activities may include walking, swimming, or biking.  Work with your health care provider or dietitian to adjust your eating plan to your individual calorie needs. What foods should I eat? Fruits All fresh, dried, or frozen fruit. Canned fruit in natural juice (without added sugar). Vegetables Fresh  or frozen vegetables (raw, steamed, roasted, or grilled). Low-sodium or reduced-sodium tomato and vegetable juice. Low-sodium or reduced-sodium tomato sauce and tomato paste. Low-sodium or reduced-sodium canned vegetables. Grains Whole-grain or whole-wheat bread. Whole-grain or whole-wheat pasta. Brown rice. [place]. Bulgur. Whole-grain and low-sodium cereals. Pita bread. Low-fat, low-sodium crackers. Whole-wheat flour tortillas. Meats and other proteins Skinless chicken or [place]. Ground chicken or [place]. Pork with fat trimmed off. Fish and seafood. Egg whites. Dried beans, peas, or lentils. Unsalted nuts, nut butters, and seeds. Unsalted canned beans. Lean cuts of beef with fat trimmed off. Low-sodium, lean precooked or cured meat, such as sausages or meat loaves. Dairy Low-fat (1%) or fat-free (skim) milk. Reduced-fat, low-fat, or fat-free cheeses. Nonfat, low-sodium ricotta or cottage cheese. Low-fat or nonfat yogurt. Low-fat, low-sodium cheese. Fats and oils Soft margarine without trans fats. Vegetable oil. Reduced-fat, low-fat, or light mayonnaise and salad dressings (reduced-sodium). Canola, safflower, olive, avocado, soybean, and sunflower oils. Avocado. Seasonings and condiments Herbs. Spices. Seasoning mixes without salt. Other foods Unsalted popcorn and pretzels. Fat-free sweets. The items listed above may not be a complete list of foods and beverages you can eat. Contact a dietitian for more information. What foods should I avoid? Fruits Canned fruit in a light or heavy syrup. Fried fruit. Fruit in cream or butter sauce. Vegetables Creamed or fried vegetables. Vegetables in a cheese sauce. Regular canned vegetables (not low-sodium or reduced-sodium). Regular canned tomato sauce and paste (not low-sodium or reduced-sodium). Regular tomato and vegetable juice (not low-sodium or reduced-sodium). Angie Fava. Olives. Grains Baked goods made with fat, such as croissants, muffins, or  some breads. Dry pasta or rice meal packs. Meats and other proteins Fatty cuts of meat. Ribs. Fried meat. Berniece Salines. Bologna, salami, and other precooked or cured meats, such as sausages or meat loaves. Fat from the back of a pig (fatback). Bratwurst. Salted nuts and seeds. Canned beans with added salt. Canned or smoked fish. Whole eggs or egg yolks. Chicken or [place] with skin. Dairy Whole or 2% milk, cream, and half-and-half. Whole or full-fat cream cheese. Whole-fat or sweetened yogurt. Full-fat cheese. Nondairy creamers. Whipped toppings. Processed cheese and cheese spreads. Fats and oils Butter. Stick margarine. Lard. Shortening. Ghee. Bacon fat. Tropical oils, such as coconut, palm kernel, or palm oil. Seasonings and condiments Onion salt, garlic salt, seasoned salt, table salt, and sea salt. Worcestershire sauce. Tartar sauce. Barbecue sauce. Teriyaki sauce. Soy sauce, including reduced-sodium. Steak sauce. Canned and packaged gravies. Fish sauce. Oyster sauce. Cocktail sauce. Store-bought horseradish. Ketchup. Mustard. Meat flavorings and tenderizers. Bouillon cubes. Hot sauces. Pre-made or packaged marinades. Pre-made or packaged taco seasonings. Relishes. Regular salad dressings. Other foods Salted popcorn and pretzels. The items listed above may not be a complete list of foods and beverages you should avoid. Contact a dietitian for more information. Where to find more information  National Heart, Lung, and  Blood Institute: https://wilson-eaton.com/  American Heart Association: www.heart.org  Academy of Nutrition and Dietetics: www.eatright.[place]: www.kidney.org Summary  The DASH eating plan is a healthy eating plan that has been shown to reduce high blood pressure (hypertension). It may also reduce your risk for type 2 diabetes, heart disease, and stroke.  When on the DASH eating plan, aim to eat more fresh fruits and vegetables, whole grains, lean proteins, low-fat  dairy, and heart-healthy fats.  With the DASH eating plan, you should limit salt (sodium) intake to 2,300 mg a day. If you have hypertension, you may need to reduce your sodium intake to 1,500 mg a day.  Work with your health care provider or dietitian to adjust your eating plan to your individual calorie needs. This information is not intended to replace advice given to you by your health care provider. Make sure you discuss any questions you have with your health care provider. Document Revised: 11/04/2019 Document Reviewed: 11/04/2019 Elsevier Patient Education  2021 [employer].

## 2021-02-15 ENCOUNTER — Ambulatory Visit: Payer: Commercial Managed Care - PPO | Admitting: Nurse Practitioner

## 2021-02-15 ENCOUNTER — Encounter: Payer: Self-pay | Admitting: Nurse Practitioner

## 2021-02-15 ENCOUNTER — Other Ambulatory Visit: Payer: Self-pay

## 2021-02-15 DIAGNOSIS — I10 Essential (primary) hypertension: Secondary | ICD-10-CM

## 2021-02-15 DIAGNOSIS — M7989 Other specified soft tissue disorders: Secondary | ICD-10-CM

## 2021-02-15 MED ORDER — UNABLE TO FIND: 0 refills | Status: AC

## 2021-02-15 NOTE — Assessment & Plan Note (Addendum)
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 Venous insufficiency vs lymphatics; CHF and renal disease not likely -Rx. Compression stockings -referral to vascular for varicose veins

## 2021-02-15 NOTE — Progress Notes (Addendum)
Acute Office Visit  Subjective:    Patient ID: Kathleen Kirby, female    DOB: September 22, 1961, 60 y.o.   MRN: [id number]  Chief Complaint  Patient presents with  . Edema    Ankle and legs     HPI Patient is in today for leg swelling. She was seen in-office on 01/31/21 for high BP and leg swelling.  She was started on labetalol and her BP has improved.  She is still experiencing leg swelling. She was instructed to elevate her legs, wear compression stockings, and use lasix PRN for swelling.  She states that her left leg started swelling in January.  She works 12 hours, 3 days per week.  She has been wearing compression stocking and taking lasix PRN, but she states that after she gets home form work and takes off the compression stocking she still notices some swelling.  She had U/S at onset of symptoms in January, and that was negative for DVT.  Past Medical History:  Diagnosis Date  . Allergy   . Anxiety about health 07/20/2017  . Back pain   . Bilateral shoulder pain   . Diabetes mellitus without complication ([place])   . FH: CAD (coronary artery disease) 09/14/2014   Sibling with MI under age 78   . Hyperlipidemia   . Hypertension   . Metabolic syndrome X 4/97/0263  . Mild dysplasia of cervix 12/21/2013  . Obesity   . Right hip pain     History reviewed. No pertinent surgical history.  Family History  Problem Relation Age of Onset  . Heart disease Mother   . Coronary artery disease Father   . Heart disease Father   . Lung cancer Brother   . Brain cancer Brother   . Diabetes Sister   . Coronary artery disease Sister   . Hypertension Sister     Social History   Socioeconomic History  . Marital status: Married    Spouse name: Not on file  . Number of children: 0  . Years of education: Not on file  . Highest education level: Not on file  Occupational History  . Occupation: Glass blower/designer    Comment: [employer]  Tobacco Use  . Smoking status: Never Smoker  .  Smokeless tobacco: Never Used  Vaping Use  . Vaping Use: Never used  Substance and Sexual Activity  . Alcohol use: No  . Drug use: No  . Sexual activity: Yes    Partners: Male    Birth control/protection: None  Other Topics Concern  . Not on file  Social History Narrative  . Not on file   Social Determinants of Health   Financial Resource Strain: Not on file  Food Insecurity: Not on file  Transportation Needs: Not on file  Physical Activity: Not on file  Stress: Not on file  Social Connections: Not on file  Intimate Partner Violence: Not on file    Outpatient Medications Prior to Visit  Medication Sig Dispense Refill  . amLODipine (NORVASC) 5 MG tablet Take 1 tablet (5 mg total) by mouth daily. 90 tablet 1  . aspirin EC 81 MG tablet Take 81 mg by mouth daily. Swallow whole.    . cyclobenzaprine (FLEXERIL) 5 MG tablet Take 2 tablets (10 mg total) by mouth at bedtime. 20 tablet 0  . furosemide (LASIX) 20 MG tablet Take 1 tablet (20 mg total) by mouth daily as needed for fluid or edema. 30 tablet 0  . labetalol (NORMODYNE) 100 MG  tablet Take 1 tablet (100 mg total) by mouth 2 (two) times daily. 60 tablet 0  . Multiple Minerals-Vitamins (CALCIUM & VIT D3 BONE HEALTH PO) Take by mouth.    . Potassium 99 MG TABS Take by mouth.    . pravastatin (PRAVACHOL) 10 MG tablet Take 1 tablet (10 mg total) by mouth daily. 90 tablet 3  . triamterene-hydrochlorothiazide (MAXZIDE) 75-50 MG tablet TAKE 1 TABLET BY MOUTH  DAILY 90 tablet 3   No facility-administered medications prior to visit.    Allergies  Allergen Reactions  . Amoxicillin   . Codeine   . Lipitor [Atorvastatin]     Cramps, tiredness   . Meloxicam Other (See Comments)    Reports sores on tongue, stiff neck  . Sulfamethoxazole-Trimethoprim     Review of Systems  Constitutional: Negative.   Respiratory: Negative.   Cardiovascular: Positive for leg swelling.       L >>R  Psychiatric/Behavioral: Negative.         Objective:    Physical Exam Constitutional:      Appearance: Normal appearance.  Cardiovascular:     Rate and Rhythm: Normal rate and regular rhythm.     Pulses: Normal pulses.     Heart sounds: Normal heart sounds.     Comments: No swelling apparent on exam today; left calf has vericose veins Pulmonary:     Effort: Pulmonary effort is normal.     Breath sounds: Normal breath sounds.  Neurological:     Mental Status: She is alert.     BP 128/77   Pulse 83   Temp 98.3 F (36.8 C)   Resp 20   Ht 5' 1.5" (1.562 m)   Wt 171 lb (77.6 kg)   LMP 11/27/2012   SpO2 95%   BMI 31.79 kg/m  Wt Readings from Last 3 Encounters:  02/15/21 171 lb (77.6 kg)  01/31/21 174 lb 12.8 oz (79.3 kg)  01/14/21 175 lb (79.4 kg)    There are no preventive care reminders to display for this patient.  There are no preventive care reminders to display for this patient.   Lab Results  Component Value Date   TSH 1.17 04/15/2019   Lab Results  Component Value Date   WBC 5.3 08/31/2020   HGB 12.1 08/31/2020   HCT 37.3 08/31/2020   MCV 78.2 (L) 08/31/2020   PLT 309 08/31/2020   Lab Results  Component Value Date   NA 139 12/27/2020   K 3.7 12/27/2020   CO2 24 12/27/2020   GLUCOSE 133 (H) 12/27/2020   BUN 9 12/27/2020   CREATININE 0.78 12/27/2020   BILITOT 0.3 08/31/2020   ALKPHOS 79 07/09/2017   AST 18 08/31/2020   ALT 11 08/31/2020   PROT 6.5 08/31/2020   ALBUMIN 4.0 07/09/2017   CALCIUM 10.0 12/27/2020   Lab Results  Component Value Date   CHOL 204 (H) 08/31/2020   Lab Results  Component Value Date   HDL 78 08/31/2020   Lab Results  Component Value Date   LDLCALC 113 (H) 08/31/2020   Lab Results  Component Value Date   TRIG 51 08/31/2020   Lab Results  Component Value Date   CHOLHDL 2.6 08/31/2020   Lab Results  Component Value Date   HGBA1C 7.1 (A) 10/25/2020       Assessment & Plan:   Problem List Items Addressed This Visit      Cardiovascular and  Mediastinum   Essential hypertension    -BP improved  with labetalol        Other   Leg swelling    -left leg swells more than right -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 Venous insufficiency vs lymphatics; CHF and renal disease not likely -Rx. Compression stockings -referral to vascular for varicose veins      Relevant Orders   Ambulatory referral to Vascular Surgery       Meds ordered this encounter  Medications  . UNABLE TO FIND    Sig: Med Name: Pair of compression stockings 20-30 mmHg Wear stockings when working or standing for extended periods of time.    Dispense:  1 Package    Refill:  0    please fit her for appropriate size     Noreene Larsson, NP

## 2021-02-15 NOTE — Assessment & Plan Note (Signed)
-  BP improved with labetalol

## 2021-02-21 ENCOUNTER — Ambulatory Visit: Payer: Commercial Managed Care - PPO | Admitting: Internal Medicine

## 2021-02-22 ENCOUNTER — Telehealth: Payer: Self-pay | Admitting: *Deleted

## 2021-02-22 ENCOUNTER — Other Ambulatory Visit: Payer: Self-pay | Admitting: *Deleted

## 2021-02-22 DIAGNOSIS — I10 Essential (primary) hypertension: Secondary | ICD-10-CM

## 2021-02-22 DIAGNOSIS — M7989 Other specified soft tissue disorders: Secondary | ICD-10-CM

## 2021-02-22 MED ORDER — FUROSEMIDE 20 MG PO TABS: 20.0000 mg | ORAL_TABLET | Freq: Every day | ORAL | 0 refills | Status: DC | PRN

## 2021-02-22 MED ORDER — LABETALOL HCL 100 MG PO TABS: 100.0000 mg | ORAL_TABLET | Freq: Two times a day (BID) | ORAL | 0 refills | Status: DC

## 2021-02-22 NOTE — Telephone Encounter (Signed)
error 

## 2021-02-23 ENCOUNTER — Other Ambulatory Visit: Payer: Self-pay | Admitting: Internal Medicine

## 2021-02-23 DIAGNOSIS — M7989 Other specified soft tissue disorders: Secondary | ICD-10-CM

## 2021-02-23 DIAGNOSIS — I10 Essential (primary) hypertension: Secondary | ICD-10-CM

## 2021-03-05 ENCOUNTER — Other Ambulatory Visit: Payer: Self-pay | Admitting: Internal Medicine

## 2021-03-05 DIAGNOSIS — M7989 Other specified soft tissue disorders: Secondary | ICD-10-CM

## 2021-03-05 DIAGNOSIS — I10 Essential (primary) hypertension: Secondary | ICD-10-CM

## 2021-03-22 IMAGING — DX DG KNEE COMPLETE 4+V*L*
4 series · 4 of 4 positions shown · non-contrast
Comparison: None.

CLINICAL DATA: Left knee pain

EXAM:
LEFT KNEE - COMPLETE 4+ VIEW

[knee ap]
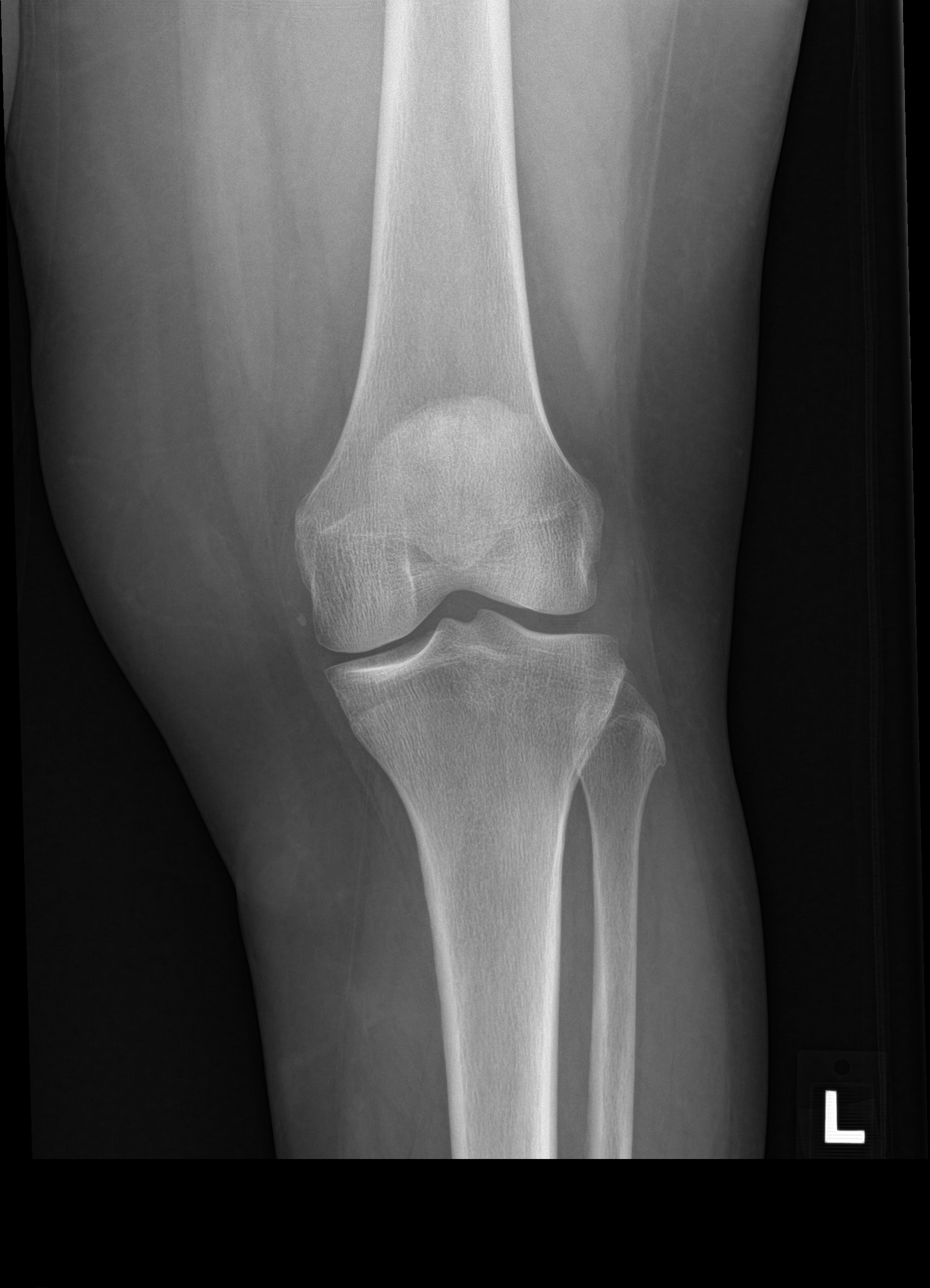

[knee lat]
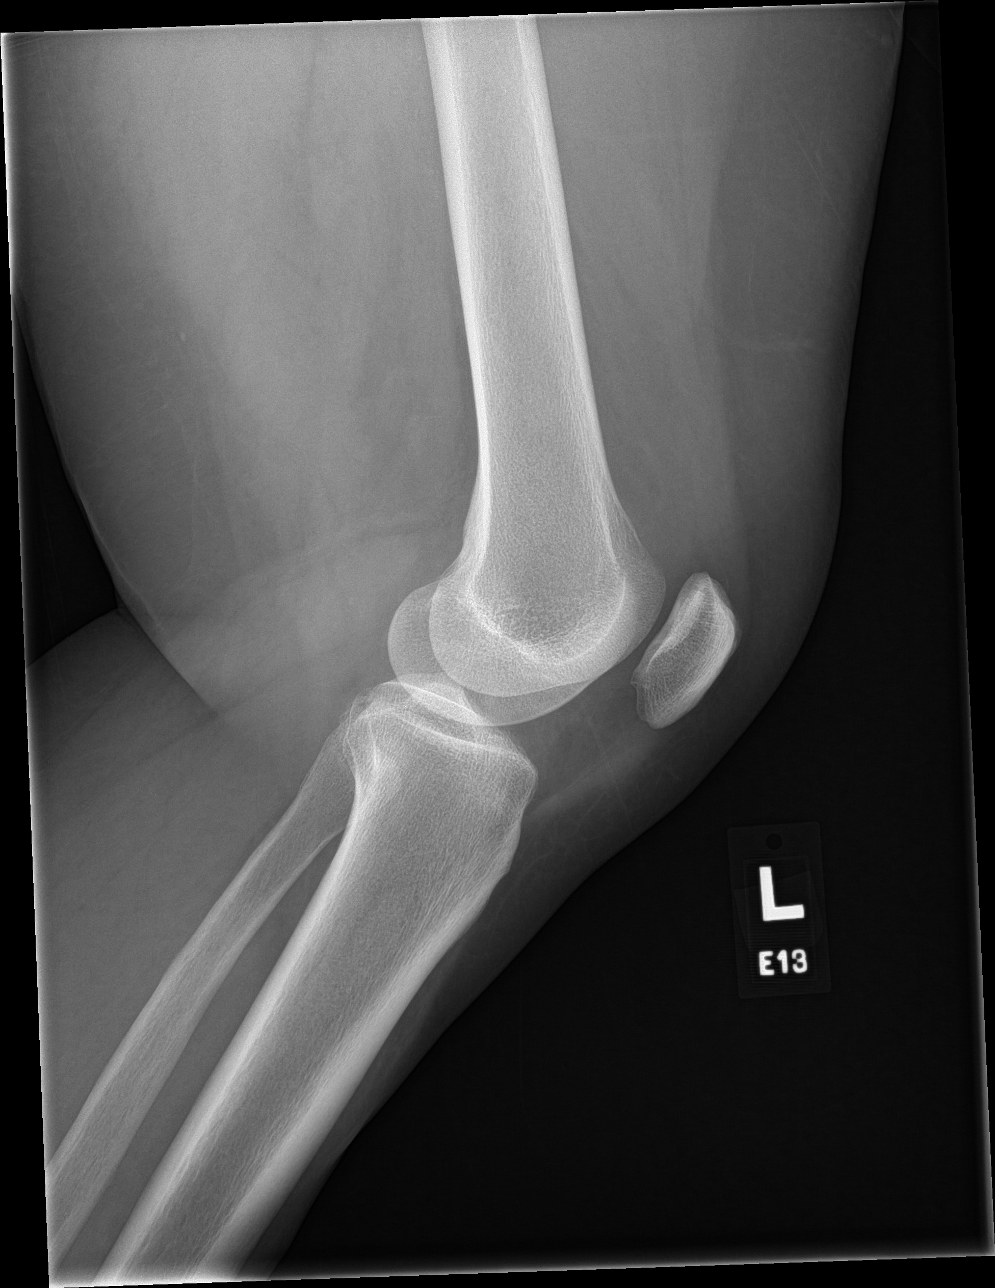

[knee obl (1 of 2)]
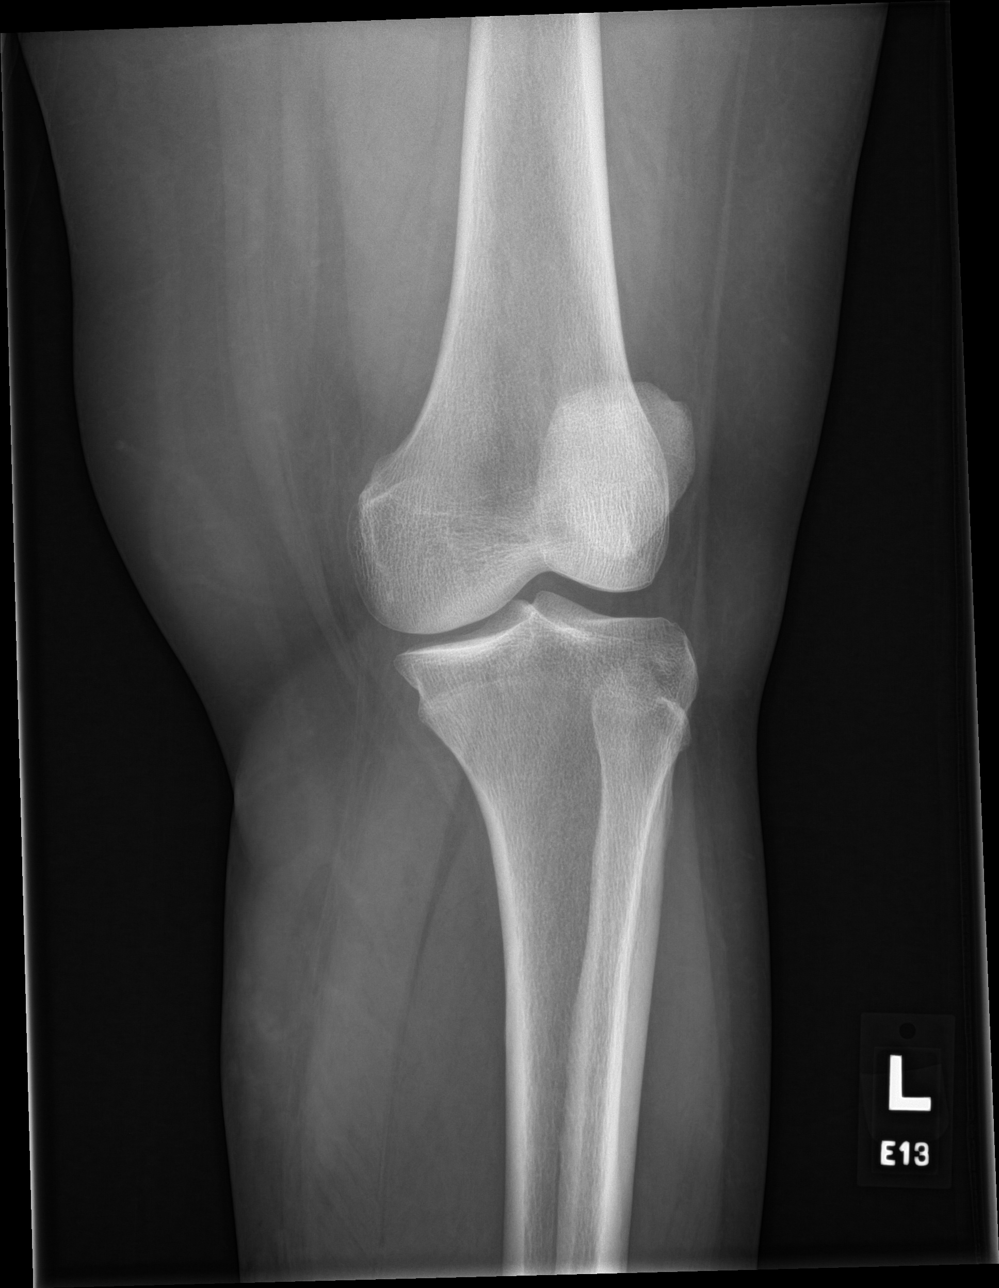

[knee obl (2 of 2)]
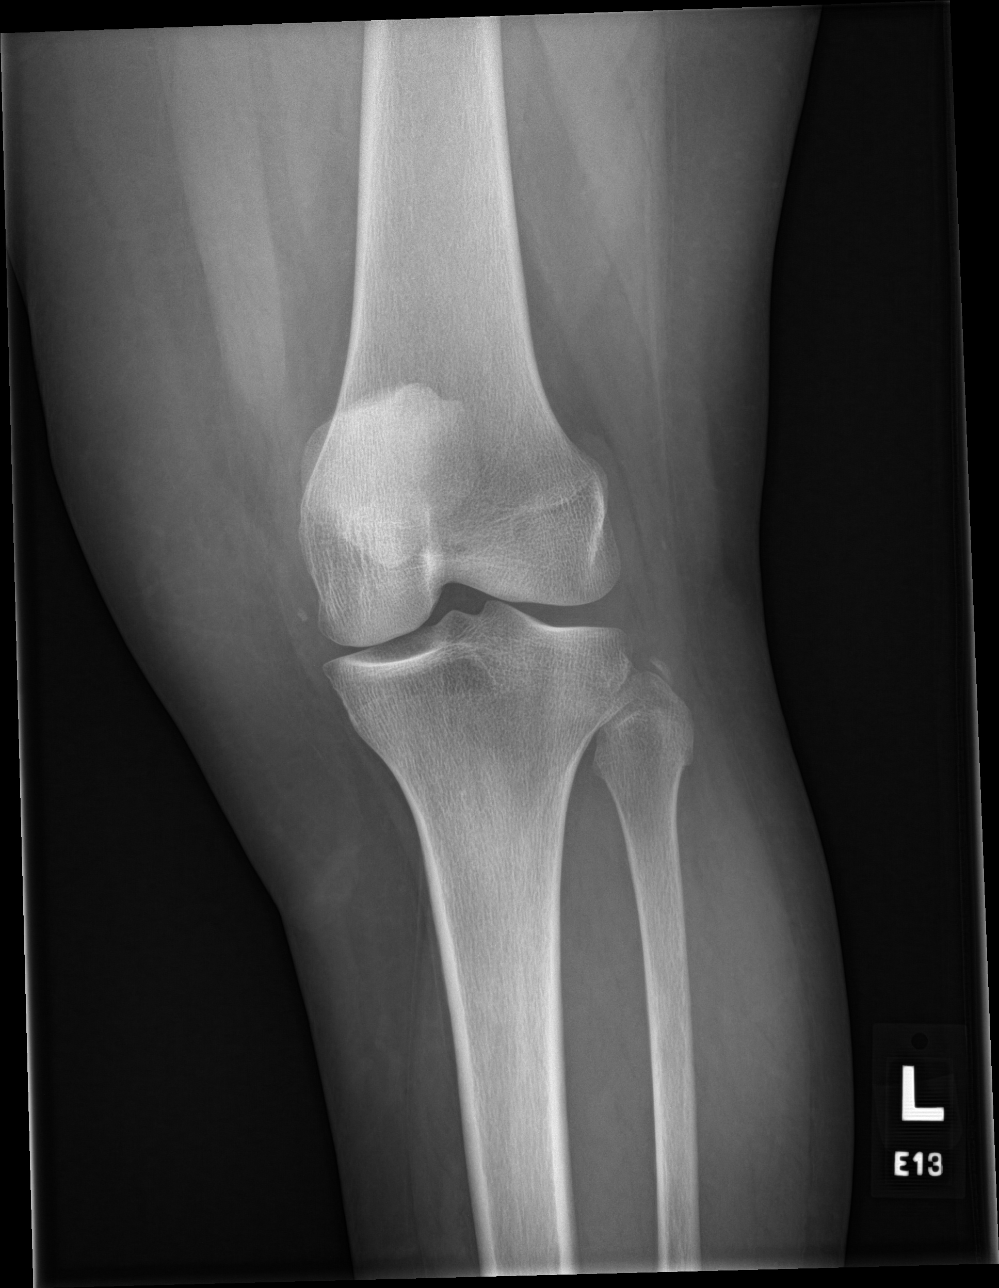

[4 of 4 positions shown; findings below may reference images not displayed]

FINDINGS: No evidence of fracture, dislocation, or joint effusion. No evidence
of arthropathy or other focal bone abnormality. Soft tissues are
unremarkable.
IMPRESSION: Negative.

## 2021-04-05 IMAGING — US US EXTREM LOW VENOUS*L*
1 series · 14 of 24 positions shown · non-contrast
Comparison: None.

CLINICAL DATA: Left leg pain and swelling.

EXAM:
LEFT LOWER EXTREMITY VENOUS DOPPLER ULTRASOUND
TECHNIQUE: Gray-scale sonography with compression, as well as color and duplex
ultrasound, were performed to evaluate the deep venous system(s)
from the level of the common femoral vein through the popliteal and
proximal calf veins.

[Series 1: us venous img lower uni left (dvt) · portal-venous · 14 of 36 slices shown]
[im 1/36]
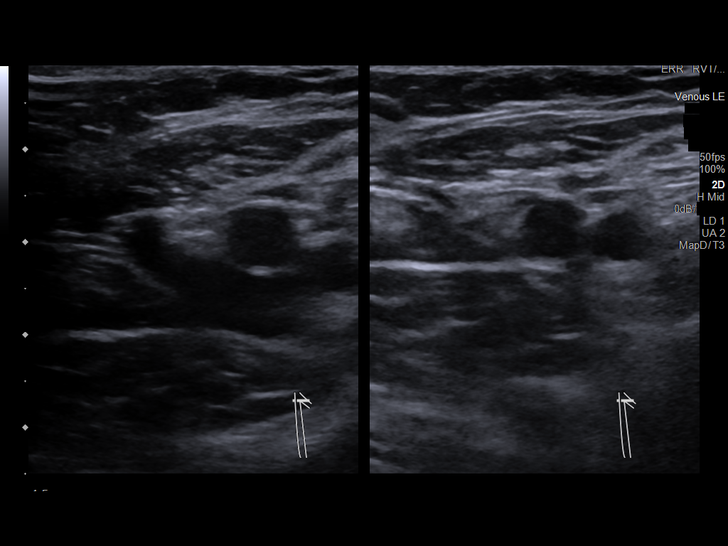
[im 4/36]
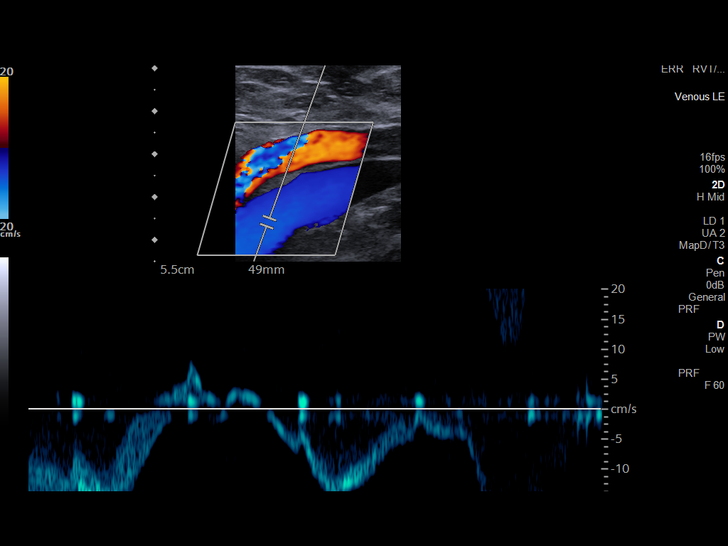
[im 7/36]
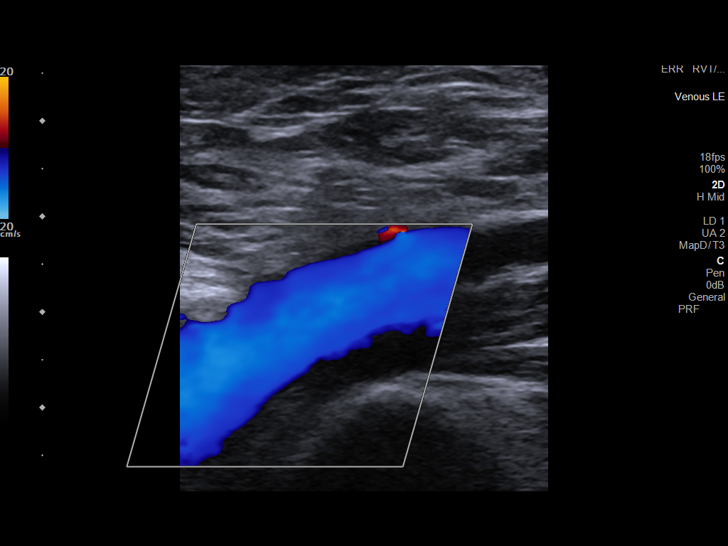
[im 10/36]
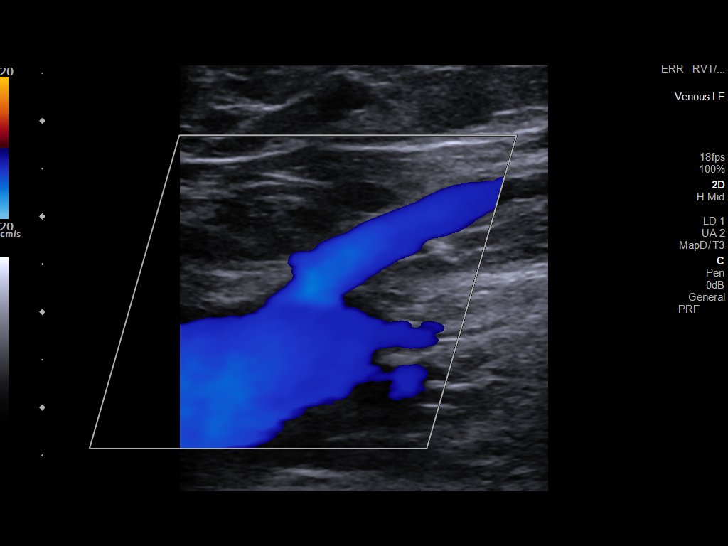
[im 11/36]
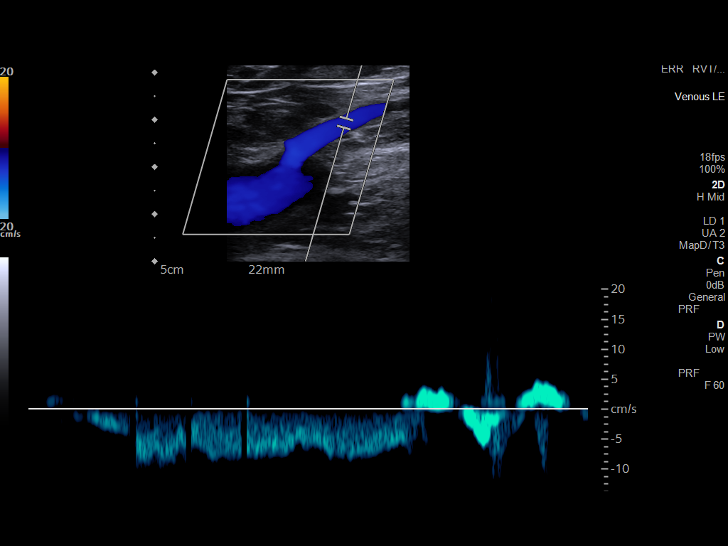
[im 14/36]
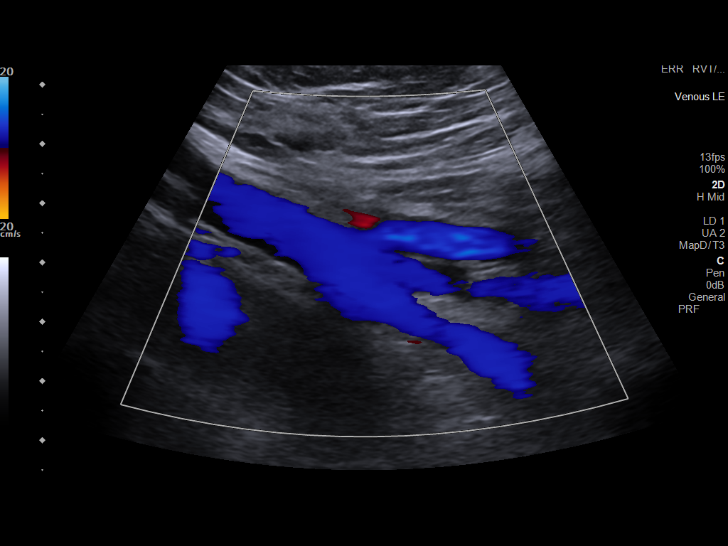
[im 17/36]
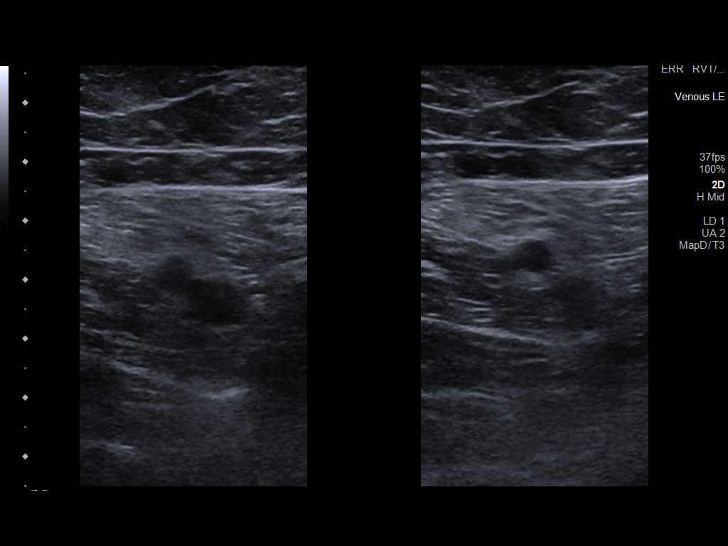
[im 19/36]
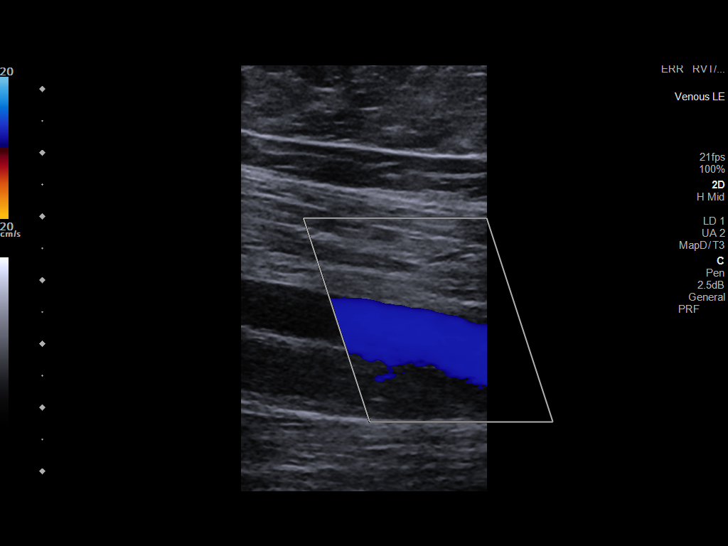
[im 22/36]
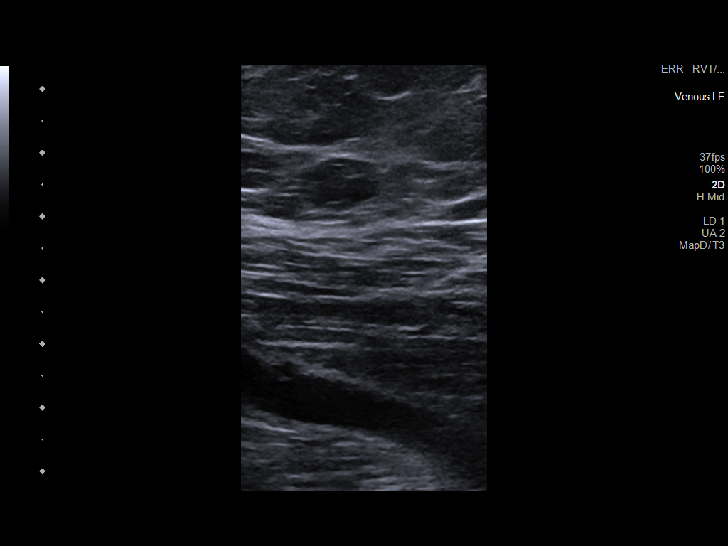
[im 25/36]
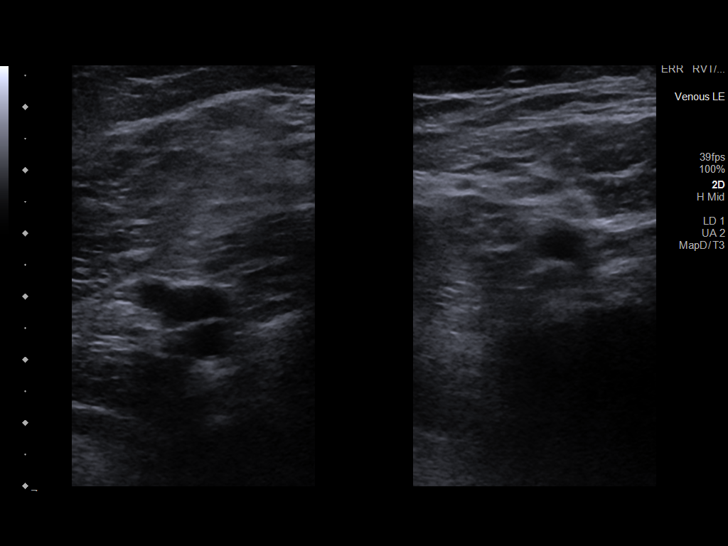
[im 28/36]
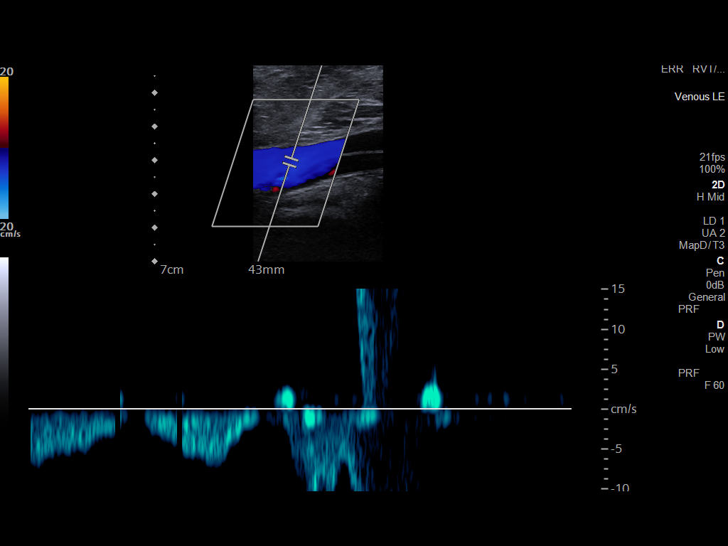
[im 29/36]
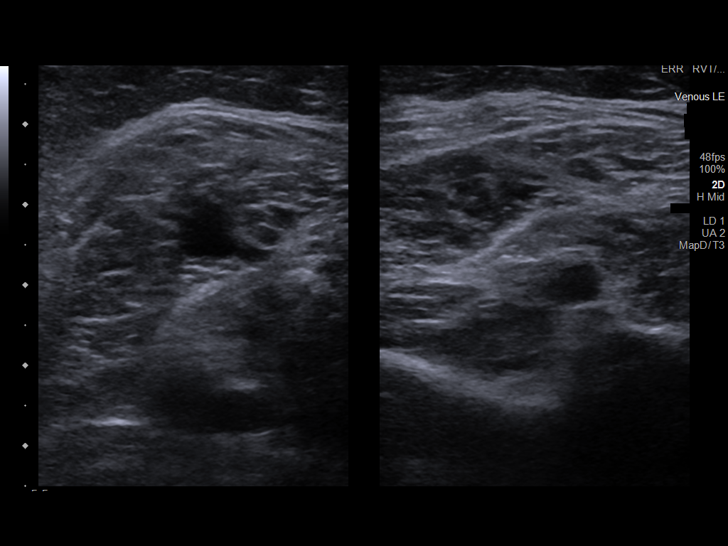
[im 32/36]
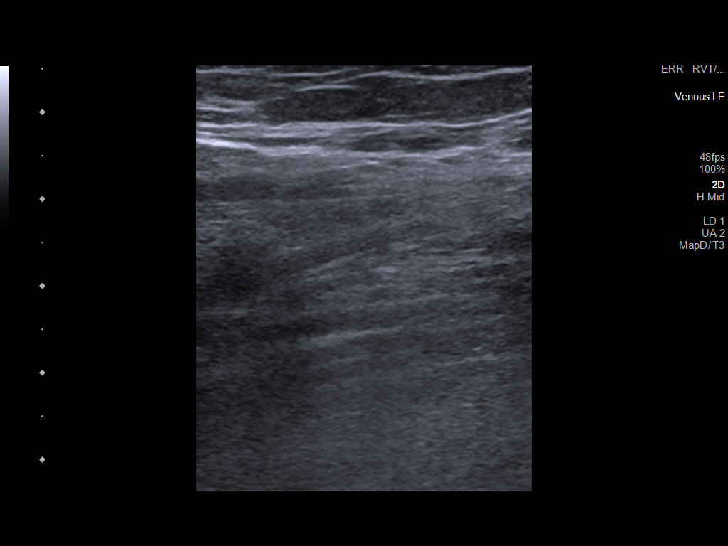
[im 36/36]
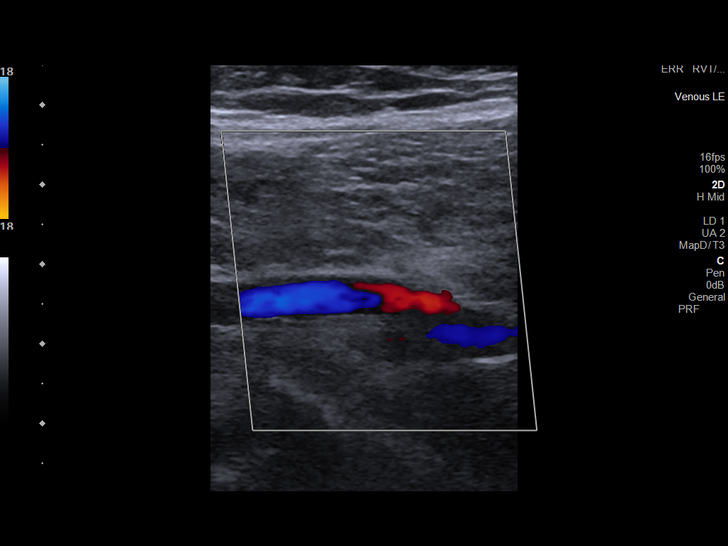

[14 of 24 positions shown; findings below may reference images not displayed]

FINDINGS: VENOUS

Normal compressibility of the common femoral, superficial femoral,
and popliteal veins, as well as the visualized calf veins.
Visualized portions of profunda femoral vein and great saphenous
vein unremarkable. No filling defects to suggest DVT on grayscale or
color Doppler imaging. Doppler waveforms show normal direction of
venous flow, normal respiratory plasticity and response to
augmentation.

Limited views of the contralateral common femoral vein are
unremarkable.

OTHER

None.

Limitations: none
IMPRESSION: Negative for DVT in the left lower extremity.

## 2021-04-09 ENCOUNTER — Other Ambulatory Visit: Payer: Self-pay

## 2021-04-09 DIAGNOSIS — M7989 Other specified soft tissue disorders: Secondary | ICD-10-CM

## 2021-04-18 ENCOUNTER — Other Ambulatory Visit: Payer: Self-pay

## 2021-04-18 ENCOUNTER — Ambulatory Visit (HOSPITAL_COMMUNITY)
Admission: RE | Admit: 2021-04-18 | Discharge: 2021-04-18 | Disposition: A | Discharge status: Home / Self Care | Payer: Commercial Managed Care - PPO | Source: Ambulatory Visit | Attending: Vascular Surgery | Admitting: Vascular Surgery

## 2021-04-18 ENCOUNTER — Ambulatory Visit: Payer: Commercial Managed Care - PPO | Admitting: Physician Assistant

## 2021-04-18 VITALS — BP 148/71 | HR 70 | Temp 98.2°F | Resp 20 | Ht 61.5 in | Wt 168.8 lb

## 2021-04-18 DIAGNOSIS — M7989 Other specified soft tissue disorders: Secondary | ICD-10-CM

## 2021-04-18 DIAGNOSIS — I8393 Asymptomatic varicose veins of bilateral lower extremities: Secondary | ICD-10-CM

## 2021-04-18 NOTE — Progress Notes (Signed)
VASCULAR & VEIN SPECIALISTS           OF [place]  History and Physical   Kathleen Kirby is a 60 y.o. female who presents with leg swelling.  She was seen by her PCP and instructed to wear compression socks and elevate her legs and given prn lasix.   She states that the swelling started in around January.  She had her Norvasc dose increased, which made her swelling worse and therefore the dose was decreased.  She states that her leg gets tight around the front of her knee when it is swelling and is painful.  She states that the knee high compression and elevation have helped.   She states that the lasix also helps. She does not have claudication symptoms.  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  She states that she is on her feet and is moving quite a bit and not standing the whole time.  She does continue to exercise and walks the track.    She had u/s in January that was negative for DVT in the LLE.   The patient has no history of DVT. Pt does not have history of skin changes in lower legs.   There is not family history of venous disorders that she is aware of.   The pt is on a statin for cholesterol management.  The pt is on a daily aspirin.   Other AC:  none The pt is on CCB, BB for hypertension.   The pt is not diabetic.   Tobacco hx:  never   Past Medical History:  Diagnosis Date  . Allergy   . Anxiety about health 07/20/2017  . Back pain   . Bilateral shoulder pain   . Diabetes mellitus without complication ([place])   . FH: CAD (coronary artery disease) 09/14/2014   Sibling with MI under age 42   . Hyperlipidemia   . Hypertension   . Metabolic syndrome X 2/83/1517  . Mild dysplasia of cervix 12/21/2013  . Obesity   . Right hip pain     No past surgical history on file.  Social History   Socioeconomic History  . Marital status: Married    Spouse name: Not on file  . Number of children: 0  . Years of education: Not on file  .  Highest education level: Not on file  Occupational History  . Occupation: Glass blower/designer    Comment: tobacco factory  Tobacco Use  . Smoking status: Never Smoker  . Smokeless tobacco: Never Used  Vaping Use  . Vaping Use: Never used  Substance and Sexual Activity  . Alcohol use: No  . Drug use: No  . Sexual activity: Yes    Partners: Male    Birth control/protection: None  Other Topics Concern  . Not on file  Social History Narrative  . Not on file   Social Determinants of Health   Financial Resource Strain: Not on file  Food Insecurity: Not on file  Transportation Needs: Not on file  Physical Activity: Not on file  Stress: Not on file  Social Connections: Not on file  Intimate Partner Violence: Not on file     Family History  Problem Relation Age of Onset  . Heart disease Mother   . Coronary artery disease Father   . Heart disease Father   . Lung cancer Brother   . Brain cancer Brother   .  Diabetes Sister   . Coronary artery disease Sister   . Hypertension Sister     Current Outpatient Medications  Medication Sig Dispense Refill  . amLODipine (NORVASC) 5 MG tablet Take 1 tablet (5 mg total) by mouth daily. 90 tablet 1  . aspirin EC 81 MG tablet Take 81 mg by mouth daily. Swallow whole.    . cyclobenzaprine (FLEXERIL) 5 MG tablet Take 2 tablets (10 mg total) by mouth at bedtime. 20 tablet 0  . furosemide (LASIX) 20 MG tablet TAKE 1 TABLET BY MOUTH  DAILY AS NEEDED FOR FLUID  OR EDEMA 30 tablet 5  . labetalol (NORMODYNE) 100 MG tablet TAKE 1 TABLET BY MOUTH  TWICE DAILY 180 tablet 1  . Multiple Minerals-Vitamins (CALCIUM & VIT D3 BONE HEALTH PO) Take by mouth.    . Potassium 99 MG TABS Take by mouth.    . pravastatin (PRAVACHOL) 10 MG tablet Take 1 tablet (10 mg total) by mouth daily. 90 tablet 3  . triamterene-hydrochlorothiazide (MAXZIDE) 75-50 MG tablet TAKE 1 TABLET BY MOUTH  DAILY 90 tablet 3  . UNABLE TO FIND Med Name: Pair of compression stockings 20-30  mmHg Wear stockings when working or standing for extended periods of time. 1 Package 0   No current facility-administered medications for this visit.    Allergies  Allergen Reactions  . Amoxicillin   . Codeine   . Lipitor [Atorvastatin]     Cramps, tiredness   . Meloxicam Other (See Comments)    Reports sores on tongue, stiff neck  . Sulfamethoxazole-Trimethoprim     REVIEW OF SYSTEMS:   [X]  denotes positive finding, [ ]  denotes negative finding Cardiac  Comments:  Chest pain or chest pressure:    Shortness of breath upon exertion:    Short of breath when lying flat:    Irregular heart rhythm:        Vascular    Pain in calf, thigh, or hip brought on by ambulation:    Pain in feet at night that wakes you up from your sleep:     Blood clot in your veins:    Leg swelling:  x       Pulmonary    Oxygen at home:    Productive cough:     Wheezing:         Neurologic    Sudden weakness in arms or legs:     Sudden numbness in arms or legs:     Sudden onset of difficulty speaking or slurred speech:    Temporary loss of vision in one eye:     Problems with dizziness:         Gastrointestinal    Blood in stool:     Vomited blood:         Genitourinary    Burning when urinating:     Blood in urine:        Psychiatric    Major depression:         Hematologic    Bleeding problems:    Problems with blood clotting too easily:        Skin    Rashes or ulcers:        Constitutional    Fever or chills:      PHYSICAL EXAMINATION:  +--------------+---------+------+-----------+------------+--------+  LEFT     Reflux NoRefluxReflux TimeDiameter cmsComments               Yes                   +--------------+---------+------+-----------+------------+--------+  CFV            yes  >1 second             +--------------+---------+------+-----------+------------+--------+  FV prox    no                          +--------------+---------+------+-----------+------------+--------+  FV mid    no                         +--------------+---------+------+-----------+------------+--------+  FV dist    no                         +--------------+---------+------+-----------+------------+--------+  Popliteal   no                         +--------------+---------+------+-----------+------------+--------+  GSV at SFJ        yes  >500 ms   0.602        +--------------+---------+------+-----------+------------+--------+  GSV prox thigh      yes  >500 ms   0.581        +--------------+---------+------+-----------+------------+--------+  GSV mid thigh       yes  >500 ms   0.575        +--------------+---------+------+-----------+------------+--------+  GSV dist thigh      yes  >500 ms   0.568        +--------------+---------+------+-----------+------------+--------+  GSV at knee        yes  >500 ms   0.589        +--------------+---------+------+-----------+------------+--------+  GSV prox calf                 0.541        +--------------+---------+------+-----------+------------+--------+  GSV mid calf                 0.366        +--------------+---------+------+-----------+------------+--------+  SSV Pop Fossa no               0.183        +--------------+---------+------+-----------+------------+--------+  SSV prox calf no               0.178        +--------------+---------+------+-----------+------------+--------+  SSV mid calf                 0.197        +--------------+---------+------+-----------+------------+--------+         Summary:  Left:  - No evidence of deep vein thrombosis seen in the left lower extremity,  from the common femoral through the popliteal veins.  - No evidence of superficial venous thrombosis in the left lower  extremity.  - Venous reflux is noted in the left common femoral vein.  - Venous reflux is noted in the left sapheno-femoral junction.  - Venous reflux is noted in the left greater saphenous vein in the thigh.  - Anechoic structure seen in left popliteal fossa measuring 2.03 cm x 2.10 cm is consistent with Baker's cyst   General:  WDWN in NAD; vital signs documented above Gait: Not observed HENT: WNL, normocephalic Pulmonary: normal non-labored breathing without wheezing Cardiac: regular HR; without carotid bruits Abdomen: soft, NT, no masses; aortic pulse is not palpable Skin: without rashes Vascular Exam/Pulses:  Right Left  Radial 2+ (normal) 2+ (normal)  DP 2+ (normal) 2+ (normal)  PT 1+ (weak) 1+ (weak)   Extremities: without ischemic  changes, without cellulitis; without open wounds Musculoskeletal: no muscle wasting or atrophy  Neurologic: A&O X 3;  moving all extremities equally Psychiatric:  The pt has Normal affect.   Non-Invasive Vascular Imaging:   Venous duplex on 04/18/2021: +--------------+---------+------+-----------+------------+--------+  LEFT     Reflux NoRefluxReflux TimeDiameter cmsComments               Yes                   +--------------+---------+------+-----------+------------+--------+  CFV            yes  >1 second             +--------------+---------+------+-----------+------------+--------+  FV prox    no                         +--------------+---------+------+-----------+------------+--------+  FV mid    no                         +--------------+---------+------+-----------+------------+--------+  FV  dist    no                         +--------------+---------+------+-----------+------------+--------+  Popliteal   no                         +--------------+---------+------+-----------+------------+--------+  GSV at SFJ        yes  >500 ms   0.602        +--------------+---------+------+-----------+------------+--------+  GSV prox thigh      yes  >500 ms   0.581        +--------------+---------+------+-----------+------------+--------+  GSV mid thigh       yes  >500 ms   0.575        +--------------+---------+------+-----------+------------+--------+  GSV dist thigh      yes  >500 ms   0.568        +--------------+---------+------+-----------+------------+--------+  GSV at knee        yes  >500 ms   0.589        +--------------+---------+------+-----------+------------+--------+  GSV prox calf                 0.541        +--------------+---------+------+-----------+------------+--------+  GSV mid calf                 0.366        +--------------+---------+------+-----------+------------+--------+  SSV Pop Fossa no               0.183        +--------------+---------+------+-----------+------------+--------+  SSV prox calf no               0.178        +--------------+---------+------+-----------+------------+--------+  SSV mid calf                 0.197        +--------------+---------+------+-----------+------------+--------+        Summary:  Left:  - No evidence of deep vein thrombosis seen in the left lower extremity,  from the common femoral through the popliteal veins.  - No evidence of superficial venous thrombosis in the left lower  extremity.  - Venous reflux is noted in the  left common femoral vein.  - Venous reflux is noted in the left sapheno-femoral junction.  - Venous reflux is noted in the left greater saphenous vein in the thigh.  - Anechoic structure  seen in left popliteal fossa measuring 2.03 cm x 2.10 cm is consistent with Baker's cyst.    YULIYA NOVA is a 60 y.o. female who presents with: left leg swelling   -pt does not have evidence of DVT.  She does have reflux in the CFV as well as at the GSV at the [facility] and vein 48mm throughout.  She has pain around the front of her knee when her leg is swollen.   -discussed with pt about wearing thigh high 20-5mmHg compression stockings.  She will return in 3 months to see MD to determine if her symptoms have improved and to be considered for possible laser ablation -discussed the importance of leg elevation and how to elevate properly - pt is advised to elevate their legs and a diagram is given to them to demonstrate to lay flat on their back with knees elevated and slightly bent with their feet higher than her knees, which puts their feet higher than their heart for 15 minutes per day.  If they cannot lay flat, advised to lay as flat as possible.  -pt is advised to continue as much walking as possible and avoid sitting or standing for long periods of time.  -discussed importance of weight loss and exercise and that water aerobics would also be beneficial.  -handout with recommendations given    Leontine Locket, [facility] Vascular and Vein Specialists 04/18/2021 9:05 AM  Clinic MD:  Oneida Alar

## 2021-04-26 ENCOUNTER — Other Ambulatory Visit: Payer: Self-pay | Admitting: Family Medicine

## 2021-05-14 ENCOUNTER — Encounter: Payer: Self-pay | Admitting: Family Medicine

## 2021-05-14 ENCOUNTER — Ambulatory Visit (INDEPENDENT_AMBULATORY_CARE_PROVIDER_SITE_OTHER): Payer: Commercial Managed Care - PPO | Admitting: Family Medicine

## 2021-05-14 ENCOUNTER — Other Ambulatory Visit: Payer: Self-pay

## 2021-05-14 VITALS — BP 132/70 | HR 78 | Temp 98.6°F | Ht 61.5 in | Wt 163.0 lb

## 2021-05-14 DIAGNOSIS — Z23 Encounter for immunization: Secondary | ICD-10-CM | POA: Diagnosis not present

## 2021-05-14 DIAGNOSIS — I10 Essential (primary) hypertension: Secondary | ICD-10-CM | POA: Diagnosis not present

## 2021-05-14 DIAGNOSIS — E785 Hyperlipidemia, unspecified: Secondary | ICD-10-CM | POA: Diagnosis not present

## 2021-05-14 DIAGNOSIS — E119 Type 2 diabetes mellitus without complications: Secondary | ICD-10-CM | POA: Diagnosis not present

## 2021-05-14 DIAGNOSIS — E669 Obesity, unspecified: Secondary | ICD-10-CM

## 2021-05-14 DIAGNOSIS — M7989 Other specified soft tissue disorders: Secondary | ICD-10-CM

## 2021-05-14 MED ORDER — CYCLOBENZAPRINE HCL 10 MG PO TABS: ORAL_TABLET | ORAL | 1 refills | Status: DC

## 2021-05-14 NOTE — Patient Instructions (Addendum)
Annual physical exam with MD with pap Nov 11 or after,  Call if you need me sooner  Labs today  Ordered on 01/14/2021  TdAp today  Please schedule covid booster for next week  BP is excellent  Thankful right leg swelling has resolved with compression hose  Cyclobenzaprine is refilled , use only as needed for back and leg spasm at bedtime only  It is important that you exercise regularly at least 30 minutes 5 times a week. If you develop chest pain, have severe difficulty breathing, or feel very tired, stop exercising immediately and seek medical attention  \Think about what you will eat, plan ahead. Choose " clean, green, fresh or frozen" over canned, processed or packaged foods which are more sugary, salty and fatty. 70 to 75% of food eaten should be vegetables and fruit. Three meals at set times with snacks allowed between meals, but they must be fruit or vegetables. Aim to eat over a 12 hour period , example 7 am to 7 pm, and STOP after  your last meal of the day. Drink water,generally about 64 ounces per day, no other drink is as healthy. Fruit juice is best enjoyed in a healthy way, by EATING the fruit. Thanks for choosing [facility], we consider it a privelige to serve you.

## 2021-05-15 LAB — CMP14+EGFR
ALT: 17 IU/L (ref 0–32)
AST: 16 IU/L (ref 0–40)
Albumin/Globulin Ratio: 1.7 (ref 1.2–2.2)
Albumin: 4.5 g/dL (ref 3.8–4.9)
Alkaline Phosphatase: 99 IU/L (ref 44–121)
BUN/Creatinine Ratio: 14 (ref 9–23)
BUN: 11 mg/dL (ref 6–24)
Bilirubin Total: 0.4 mg/dL (ref 0.0–1.2)
CO2: 24 mmol/L (ref 20–29)
Calcium: 10 mg/dL (ref 8.7–10.2)
Chloride: 98 mmol/L (ref 96–106)
Creatinine, Ser: 0.77 mg/dL (ref 0.57–1.00)
Globulin, Total: 2.7 g/dL (ref 1.5–4.5)
Glucose: 93 mg/dL (ref 65–99)
Potassium: 3.2 mmol/L — ABNORMAL LOW (ref 3.5–5.2)
Sodium: 137 mmol/L (ref 134–144)
Total Protein: 7.2 g/dL (ref 6.0–8.5)
eGFR: 89 mL/min/{1.73_m2} (ref >59)

## 2021-05-15 LAB — LIPID PANEL
Chol/HDL Ratio: 2.9 ratio (ref 0.0–4.4)
Cholesterol, Total: 230 mg/dL — ABNORMAL HIGH (ref 100–199)
HDL: 78 mg/dL (ref >39)
LDL Chol Calc (NIH): 141 mg/dL — ABNORMAL HIGH (ref 0–99)
Triglycerides: 63 mg/dL (ref 0–149)
VLDL Cholesterol Cal: 11 mg/dL (ref 5–40)

## 2021-05-15 LAB — HEMOGLOBIN A1C
Est. average glucose Bld gHb Est-mCnc: 154 mg/dL
Hgb A1c MFr Bld: 7 % — ABNORMAL HIGH (ref 4.8–5.6)

## 2021-05-15 MED ORDER — ROSUVASTATIN CALCIUM 5 MG PO TABS: ORAL_TABLET | ORAL | 3 refills | Status: DC

## 2021-05-20 ENCOUNTER — Encounter: Payer: Self-pay | Admitting: Family Medicine

## 2021-05-20 NOTE — Assessment & Plan Note (Signed)
Controlled, no change in medication DASH diet and commitment to daily physical activity for a minimum of 30 minutes discussed and encouraged, as a part of hypertension management. The importance of attaining a healthy weight is also discussed.  BP/Weight 05/14/2021 04/18/2021 02/15/2021 01/31/2021 01/14/2021 01/10/2021 06/01/4858  Systolic BP 276 394 320 037 944 461 901  Diastolic BP 70 71 77 80 82 90 82  Wt. (Lbs) 163 168.8 171 174.8 175 175.4 170  BMI 30.3 31.38 31.79 33.03 32.53 32.6 31.6

## 2021-05-20 NOTE — Assessment & Plan Note (Signed)
Kathleen Kirby is reminded of the importance of commitment to daily physical activity for 30 minutes or more, as able and the need to limit carbohydrate intake to 30 to 60 grams per meal to help with blood sugar control.   Improving   Kathleen Kirby is reminded of the importance of daily foot exam, annual eye examination, and good blood sugar, blood pressure and cholesterol control.  Diabetic Labs Latest Ref Rng & Units 05/14/2021 12/27/2020 10/25/2020 09/06/2020 08/31/2020  HbA1c 4.8 - 5.6 % 7.0(H) - 7.1(A) - 6.8(H)  Microalbumin Not Estab. ug/mL - - - <3.0(H) -  Micro/Creat Ratio <30 mcg/mg creat - - - - -  Chol 100 - 199 mg/dL 230(H) - - - 204(H)  HDL >39 mg/dL 78 - - - 78  Calc LDL 0 - 99 mg/dL 141(H) - - - 113(H)  Triglycerides 0 - 149 mg/dL 63 - - - 51  Creatinine 0.57 - 1.00 mg/dL 0.77 0.78 - - 0.66   BP/Weight 05/14/2021 04/18/2021 02/15/2021 01/31/2021 01/14/2021 01/10/2021 9/40/7680  Systolic BP 881 103 159 458 592 924 462  Diastolic BP 70 71 77 80 82 90 82  Wt. (Lbs) 163 168.8 171 174.8 175 175.4 170  BMI 30.3 31.38 31.79 33.03 32.53 32.6 31.6   Foot/eye exam completion dates Latest Ref Rng & Units 11/01/2020 10/25/2020  Eye Exam No Retinopathy No Retinopathy -  Foot Form Completion - - Done

## 2021-05-20 NOTE — Progress Notes (Signed)
[name]     MRN: [id number]      DOB: 1961/05/16   HPI Kathleen Kirby is here for follow up and re-evaluation of chronic medical conditions, medication management and review of any available recent lab and radiology data.  Preventive health is updated, specifically  Cancer screening and Immunization.   Vascular  The PT denies any adverse reactions to current medications since the last visit.  There are no new concerns.  There are no specific complaints  Denies polyuria, polydipsia, blurred vision , or hypoglycemic episodes.   ROS Denies recent fever or chills. Denies sinus pressure, nasal congestion, ear pain or sore throat. Denies chest congestion, productive cough or wheezing. Denies chest pains, palpitations and leg swelling Denies abdominal pain, nausea, vomiting,diarrhea or constipation.   Denies dysuria, frequency, hesitancy or incontinence. Denies joint pain, swelling and limitation in mobility. Denies headaches, seizures, numbness, or tingling. Denies depression, anxiety or insomnia. Denies skin break down or rash.   PE  BP 132/70 (BP Location: Right Arm, Patient Position: Sitting, Cuff Size: Normal)   Pulse 78   Temp 98.6 F (37 C) (Temporal)   Ht 5' 1.5" (1.562 m)   Wt 163 lb (73.9 kg)   LMP 11/27/2012   SpO2 98%   BMI 30.30 kg/m   Patient alert and oriented and in no cardiopulmonary distress.  HEENT: No facial asymmetry, EOMI,     Neck supple .  Chest: Clear to auscultation bilaterally.  CVS: S1, S2 no murmurs, no S3.Regular rate.  ABD: Soft non tender.   Ext: No edema  MS: Adequate ROM spine, shoulders, hips and knees.  Skin: Intact, no ulcerations or rash noted.  Psych: Good eye contact, normal affect. Memory intact not anxious or depressed appearing.  CNS: CN 2-12 intact, power,  normal throughout.no focal deficits noted.   Assessment & Plan  Essential hypertension Controlled, no change in medication DASH diet and commitment to daily  physical activity for a minimum of 30 minutes discussed and encouraged, as a part of hypertension management. The importance of attaining a healthy weight is also discussed.  BP/Weight 05/14/2021 04/18/2021 02/15/2021 01/31/2021 01/14/2021 01/10/2021 6/72/0947  Systolic BP 096 283 662 947 654 650 354  Diastolic BP 70 71 77 80 82 90 82  Wt. (Lbs) 163 168.8 171 174.8 175 175.4 170  BMI 30.3 31.38 31.79 33.03 32.53 32.6 31.6       Hyperlipidemia LDL goal <100 Hyperlipidemia:Low fat diet discussed and encouraged.   Lipid Panel  Lab Results  Component Value Date   CHOL 230 (H) 05/14/2021   HDL 78 05/14/2021   LDLCALC 141 (H) 05/14/2021   TRIG 63 05/14/2021   CHOLHDL 2.9 05/14/2021     Uncontrolled and at increased CV risk, start low dose statin if tolerated  Leg swelling Improved with compression hose  Obesity (BMI 30.0-34.9) Improved  Patient re-educated about  the importance of commitment to a  minimum of 150 minutes of exercise per week as able.  The importance of healthy food choices with portion control discussed, as well as eating regularly and within a 12 hour window most days. The need to choose "clean , green" food 50 to 75% of the time is discussed, as well as to make water the primary drink and set a goal of 64 ounces water daily.    Weight /BMI 05/14/2021 04/18/2021 02/15/2021  WEIGHT 163 lb 168 lb 12.8 oz 171 lb  HEIGHT 5' 1.5" 5' 1.5" 5' 1.5"  BMI 30.3  kg/m2 31.38 kg/m2 31.79 kg/m2      Type 2 diabetes, diet controlled ([place]) Ms. Bruinsma is reminded of the importance of commitment to daily physical activity for 30 minutes or more, as able and the need to limit carbohydrate intake to 30 to 60 grams per meal to help with blood sugar control.   Improving   Ms. Timmins is reminded of the importance of daily foot exam, annual eye examination, and good blood sugar, blood pressure and cholesterol control.  Diabetic Labs Latest Ref Rng & Units 05/14/2021 12/27/2020 10/25/2020  09/06/2020 08/31/2020  HbA1c 4.8 - 5.6 % 7.0(H) - 7.1(A) - 6.8(H)  Microalbumin Not Estab. ug/mL - - - <3.0(H) -  Micro/Creat Ratio <30 mcg/mg creat - - - - -  Chol 100 - 199 mg/dL 230(H) - - - 204(H)  HDL >39 mg/dL 78 - - - 78  Calc LDL 0 - 99 mg/dL 141(H) - - - 113(H)  Triglycerides 0 - 149 mg/dL 63 - - - 51  Creatinine 0.57 - 1.00 mg/dL 0.77 0.78 - - 0.66   BP/Weight 05/14/2021 04/18/2021 02/15/2021 01/31/2021 01/14/2021 01/10/2021 2/39/5320  Systolic BP 233 435 686 168 372 902 111  Diastolic BP 70 71 77 80 82 90 82  Wt. (Lbs) 163 168.8 171 174.8 175 175.4 170  BMI 30.3 31.38 31.79 33.03 32.53 32.6 31.6   Foot/eye exam completion dates Latest Ref Rng & Units 11/01/2020 10/25/2020  Eye Exam No Retinopathy No Retinopathy -  Foot Form Completion - - Done

## 2021-05-20 NOTE — Assessment & Plan Note (Signed)
Hyperlipidemia:Low fat diet discussed and encouraged.   Lipid Panel  Lab Results  Component Value Date   CHOL 230 (H) 05/14/2021   HDL 78 05/14/2021   LDLCALC 141 (H) 05/14/2021   TRIG 63 05/14/2021   CHOLHDL 2.9 05/14/2021     Uncontrolled and at increased CV risk, start low dose statin if tolerated

## 2021-05-20 NOTE — Assessment & Plan Note (Signed)
Improved with compression hose.   

## 2021-05-20 NOTE — Assessment & Plan Note (Signed)
Improved  Patient re-educated about  the importance of commitment to a  minimum of 150 minutes of exercise per week as able.  The importance of healthy food choices with portion control discussed, as well as eating regularly and within a 12 hour window most days. The need to choose "clean , green" food 50 to 75% of the time is discussed, as well as to make water the primary drink and set a goal of 64 ounces water daily.    Weight /BMI 05/14/2021 04/18/2021 02/15/2021  WEIGHT 163 lb 168 lb 12.8 oz 171 lb  HEIGHT 5' 1.5" 5' 1.5" 5' 1.5"  BMI 30.3 kg/m2 31.38 kg/m2 31.79 kg/m2

## 2021-05-24 ENCOUNTER — Telehealth: Payer: Self-pay

## 2021-05-24 ENCOUNTER — Other Ambulatory Visit: Payer: Self-pay | Admitting: Family Medicine

## 2021-05-24 ENCOUNTER — Encounter: Payer: Self-pay | Admitting: Family Medicine

## 2021-05-24 NOTE — Telephone Encounter (Signed)
Patient called needs to know does she need to stop taking the medicine pravastatin (PRAVACHOL) 10 MG tabletand take rosuvastatin (CRESTOR) 5 MG tablet instead.  Patient call back # [phone].

## 2021-05-27 ENCOUNTER — Telehealth: Payer: Self-pay

## 2021-05-27 NOTE — Telephone Encounter (Signed)
Patient called left voicemail said she did receive her medicines, but still had some questions, call back # [phone]

## 2021-05-27 NOTE — Telephone Encounter (Signed)
LVM for pt to call the office.

## 2021-05-27 NOTE — Telephone Encounter (Signed)
Pt was made aware by [employer]

## 2021-05-28 NOTE — Telephone Encounter (Signed)
Called pt and lvm to call me back

## 2021-05-30 ENCOUNTER — Encounter: Payer: Self-pay | Admitting: Internal Medicine

## 2021-05-30 ENCOUNTER — Telehealth: Payer: Self-pay

## 2021-05-30 NOTE — Telephone Encounter (Signed)
Called pt- goes straight to voicemail. Will address if she calls back

## 2021-05-30 NOTE — Telephone Encounter (Signed)
PT lvm that she would like to talk to [facility]

## 2021-06-04 NOTE — Telephone Encounter (Signed)
Goes straight to vm and not even ringing on her end. Left message to see if they would message me the next time she called so I could speak with her at that time since I had been trying for over a week to call her back

## 2021-07-03 ENCOUNTER — Other Ambulatory Visit: Payer: Self-pay | Admitting: Family Medicine

## 2021-07-24 ENCOUNTER — Other Ambulatory Visit: Payer: Self-pay

## 2021-07-24 MED ORDER — ROSUVASTATIN CALCIUM 5 MG PO TABS: ORAL_TABLET | ORAL | 3 refills | Status: DC

## 2021-08-01 ENCOUNTER — Other Ambulatory Visit: Payer: Self-pay

## 2021-08-01 ENCOUNTER — Encounter: Payer: Self-pay | Admitting: Vascular Surgery

## 2021-08-01 ENCOUNTER — Ambulatory Visit (INDEPENDENT_AMBULATORY_CARE_PROVIDER_SITE_OTHER): Payer: Commercial Managed Care - PPO | Admitting: Vascular Surgery

## 2021-08-01 VITALS — BP 129/71 | HR 67 | Temp 97.7°F | Resp 16 | Ht 65.0 in | Wt 160.0 lb

## 2021-08-01 DIAGNOSIS — I872 Venous insufficiency (chronic) (peripheral): Secondary | ICD-10-CM | POA: Diagnosis not present

## 2021-08-01 DIAGNOSIS — M7989 Other specified soft tissue disorders: Secondary | ICD-10-CM

## 2021-08-01 DIAGNOSIS — I8393 Asymptomatic varicose veins of bilateral lower extremities: Secondary | ICD-10-CM

## 2021-08-01 NOTE — Progress Notes (Signed)
REASON FOR VISIT:   Follow-up of chronic venous disease  MEDICAL ISSUES:   CHRONIC VENOUS DISEASE: The patient's left leg swelling has significantly improved with thigh-high compression stockings.  She really has no significant symptoms in the left leg.  Although her venous duplex scan shows significant superficial venous reflux in the left great saphenous vein and some deep venous reflux in the common femoral vein and she is asymptomatic.  This reason I would continue with conservative treatment.  We discussed the importance of intermittent leg elevation and the proper positioning for this.  I have encouraged her to continue to wear her compression stockings.  We discussed importance of exercise specifically walking and water aerobics.  I have encouraged her to avoid prolonged sitting and standing.  We also discussed the importance of maintaining a healthy weight as central obesity especially increases lower extremity venous pressure.  If she develops symptoms of venous hypertension in the future or significant varicose veins that are painful I think she might potentially be a candidate for laser ablation of the left great saphenous vein.   HPI:   Kathleen Kirby is a pleasant 60 y.o. female who was seen by Liana Crocker, [place] on 04/18/2021.  She presented with leg swelling.  She had been seen by her primary care physician and instructed to wear compression stockings and elevate her legs.  Her swelling started in January of this year.  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  She has no previous history of DVT.  She had palpable pedal pulses.  She had venous reflux on her noninvasive study.  She was fitted for thigh-high compression stockings with a gradient of 20 to 30 mmHg, encouraged to elevate her legs and exercise.  She comes in for 68-monthfollow-up visit.  Past Medical History:  Diagnosis Date   Allergy    Anxiety about health 07/20/2017   Back pain    Bilateral  shoulder pain    Diabetes mellitus without complication (HCC)    FH: CAD (coronary artery disease) 09/14/2014   Sibling with MI under age 60   Hyperlipidemia    Hypertension    Metabolic syndrome X [id number]  Mild dysplasia of cervix 12/21/2013   Obesity    Right hip pain     Family History  Problem Relation Age of Onset   Heart disease Mother    Coronary artery disease Father    Heart disease Father    Lung cancer Brother    Brain cancer Brother    Diabetes Sister    Coronary artery disease Sister    Hypertension Sister     SOCIAL HISTORY: Social History   Tobacco Use   Smoking status: Never   Smokeless tobacco: Never  Substance Use Topics   Alcohol use: No    Allergies  Allergen Reactions   Amoxicillin    Codeine    Lipitor [Atorvastatin]     Cramps, tiredness    Meloxicam Other (See Comments)    Reports sores on tongue, stiff neck   Sulfamethoxazole-Trimethoprim     Current Outpatient Medications  Medication Sig Dispense Refill   amLODipine (NORVASC) 5 MG tablet Take 1 tablet (5 mg total) by mouth daily. 90 tablet 1   aspirin EC 81 MG tablet Take 81 mg by mouth daily. Swallow whole.     cyclobenzaprine (FLEXERIL) 10 MG tablet Take one tablet at bedtime as needed, for back and/ or leg spasm  30 tablet 1   labetalol (NORMODYNE) 100 MG tablet TAKE 1 TABLET BY MOUTH  TWICE DAILY 180 tablet 1   Multiple Minerals-Vitamins (CALCIUM & VIT D3 BONE HEALTH PO) Take by mouth.     Potassium 99 MG TABS Take by mouth.     rosuvastatin (CRESTOR) 5 MG tablet Take one tablet by mouth three times weekly 36 tablet 3   triamterene-hydrochlorothiazide (MAXZIDE) 75-50 MG tablet TAKE 1 TABLET BY MOUTH  DAILY 90 tablet 3   UNABLE TO FIND Med Name: Pair of compression stockings 20-30 mmHg Wear stockings when working or standing for extended periods of time. 1 Package 0   No current facility-administered medications for this visit.    REVIEW OF SYSTEMS:  '[X]'$  denotes positive  finding, '[ ]'$  denotes negative finding Cardiac  Comments:  Chest pain or chest pressure:    Shortness of breath upon exertion:    Short of breath when lying flat:    Irregular heart rhythm:        Vascular    Pain in calf, thigh, or hip brought on by ambulation:    Pain in feet at night that wakes you up from your sleep:     Blood clot in your veins:    Leg swelling:  x       Pulmonary    Oxygen at home:    Productive cough:     Wheezing:         Neurologic    Sudden weakness in arms or legs:     Sudden numbness in arms or legs:     Sudden onset of difficulty speaking or slurred speech:    Temporary loss of vision in one eye:     Problems with dizziness:         Gastrointestinal    Blood in stool:     Vomited blood:         Genitourinary    Burning when urinating:     Blood in urine:        Psychiatric    Major depression:         Hematologic    Bleeding problems:    Problems with blood clotting too easily:        Skin    Rashes or ulcers:        Constitutional    Fever or chills:     PHYSICAL EXAM:   Vitals:   08/01/21 1601  BP: 129/71  Pulse: 67  Resp: 16  Temp: 97.7 F (36.5 C)  SpO2: 100%  Weight: 160 lb (72.6 kg)  Height: '5\' 5"'$  (1.651 m)    GENERAL: The patient is a well-nourished female, in no acute distress. The vital signs are documented above. CARDIAC: There is a regular rate and rhythm.  VASCULAR: I do not detect carotid bruits. She has palpable dorsalis pedis pulses bilaterally. She has no significant hyperpigmentation, leg swelling, varicose veins, or telangiectasias. I did look at her left great saphenous vein myself with the SonoSite and it has reflux down to the knee and is significantly dilated throughout. PULMONARY: There is good air exchange bilaterally without wheezing or rales. ABDOMEN: Soft and non-tender with normal pitched bowel sounds.  MUSCULOSKELETAL: There are no major deformities or cyanosis. NEUROLOGIC: No focal weakness  or paresthesias are detected. SKIN: There are no ulcers or rashes noted. PSYCHIATRIC: The patient has a normal affect.  DATA:    VENOUS DUPLEX: I have reviewed the venous duplex scan that was done  on 04/18/2021.  This was of the left lower extremity only.  There was no evidence of deep venous thrombosis.  There was deep venous reflux involving the common femoral vein.  There was superficial venous reflux in the left great saphenous vein from the saphenofemoral junction to the knee.  The vein measured 6 mm throughout.     Deitra Mayo [facility] [phone]

## 2021-08-06 ENCOUNTER — Other Ambulatory Visit: Payer: Self-pay | Admitting: Internal Medicine

## 2021-08-06 DIAGNOSIS — I10 Essential (primary) hypertension: Secondary | ICD-10-CM

## 2021-08-13 ENCOUNTER — Other Ambulatory Visit: Payer: Self-pay | Admitting: Internal Medicine

## 2021-10-17 ENCOUNTER — Other Ambulatory Visit (HOSPITAL_COMMUNITY): Payer: Self-pay | Admitting: Family Medicine

## 2021-10-17 DIAGNOSIS — Z1231 Encounter for screening mammogram for malignant neoplasm of breast: Secondary | ICD-10-CM

## 2021-10-30 ENCOUNTER — Ambulatory Visit (INDEPENDENT_AMBULATORY_CARE_PROVIDER_SITE_OTHER): Payer: Commercial Managed Care - PPO | Admitting: Family Medicine

## 2021-10-30 ENCOUNTER — Other Ambulatory Visit: Payer: Self-pay

## 2021-10-30 ENCOUNTER — Other Ambulatory Visit (HOSPITAL_COMMUNITY)
Admission: RE | Admit: 2021-10-30 | Discharge: 2021-10-30 | Disposition: A | Discharge status: Home / Self Care | Payer: Commercial Managed Care - PPO | Source: Ambulatory Visit | Attending: Family Medicine | Admitting: Family Medicine

## 2021-10-30 ENCOUNTER — Encounter: Payer: Self-pay | Admitting: Family Medicine

## 2021-10-30 VITALS — BP 138/74 | HR 76 | Resp 18 | Ht 61.5 in | Wt 169.0 lb

## 2021-10-30 DIAGNOSIS — I10 Essential (primary) hypertension: Secondary | ICD-10-CM

## 2021-10-30 DIAGNOSIS — Z124 Encounter for screening for malignant neoplasm of cervix: Secondary | ICD-10-CM | POA: Diagnosis not present

## 2021-10-30 DIAGNOSIS — E785 Hyperlipidemia, unspecified: Secondary | ICD-10-CM | POA: Diagnosis not present

## 2021-10-30 DIAGNOSIS — Z23 Encounter for immunization: Secondary | ICD-10-CM

## 2021-10-30 DIAGNOSIS — Z0000 Encounter for general adult medical examination without abnormal findings: Secondary | ICD-10-CM | POA: Diagnosis not present

## 2021-10-30 DIAGNOSIS — E559 Vitamin D deficiency, unspecified: Secondary | ICD-10-CM

## 2021-10-30 DIAGNOSIS — E1159 Type 2 diabetes mellitus with other circulatory complications: Secondary | ICD-10-CM

## 2021-10-30 DIAGNOSIS — E119 Type 2 diabetes mellitus without complications: Secondary | ICD-10-CM

## 2021-10-30 MED ORDER — AMLODIPINE-OLMESARTAN 5-20 MG PO TABS: 1.0000 | ORAL_TABLET | Freq: Every day | ORAL | 3 refills | Status: DC

## 2021-10-30 NOTE — Patient Instructions (Addendum)
F/u early January, re evaluate blood pressure, call if you need me sooner  Flu and pneumonia 20 vaccine today  Lipid, cmp and eGFr, hBA1c, microalb, cBC, tSH and vit D today  New for blood pressure is azor 5/20 one daily, stop azor 5 mg onw daily once you get this  Continue all other medication as before  We will contact you re diabetes management once labs are reviewed  It is important that you exercise regularly at least 30 minutes 5 times a week. If you develop chest pain, have severe difficulty breathing, or feel very tired, stop exercising immediately and seek medical attention   Think about what you will eat, plan ahead. Choose " clean, green, fresh or frozen" over canned, processed or packaged foods which are more sugary, salty and fatty. 70 to 75% of food eaten should be vegetables and fruit. Three meals at set times with snacks allowed between meals, but they must be fruit or vegetables. Aim to eat over a 12 hour period , example 7 am to 7 pm, and STOP after  your last meal of the day. Drink water,generally about 64 ounces per day, no other drink is as healthy. Fruit juice is best enjoyed in a healthy way, by EATING the fruit. Thanks for choosing [facility], we consider it a privelige to serve you.

## 2021-10-30 NOTE — Assessment & Plan Note (Signed)

## 2021-10-31 ENCOUNTER — Encounter: Payer: Self-pay | Admitting: Family Medicine

## 2021-10-31 NOTE — Assessment & Plan Note (Signed)
Kathleen Kirby is reminded of the importance of commitment to daily physical activity for 30 minutes or more, as able and the need to limit carbohydrate intake to 30 to 60 grams per meal to help with blood sugar control.   The need to take medication as prescribed, test blood sugar as directed, and to call between visits if there is a concern that blood sugar is uncontrolled is also discussed.   Kathleen Kirby is reminded of the importance of daily foot exam, annual eye examination, and good blood sugar, blood pressure and cholesterol control.  Diabetic Labs Latest Ref Rng & Units 05/14/2021 12/27/2020 10/25/2020 09/06/2020 08/31/2020  HbA1c 4.8 - 5.6 % 7.0(H) - 7.1(A) - 6.8(H)  Microalbumin Not Estab. ug/mL - - - <3.0(H) -  Micro/Creat Ratio <30 mcg/mg creat - - - - -  Chol 100 - 199 mg/dL 230(H) - - - 204(H)  HDL >39 mg/dL 78 - - - 78  Calc LDL 0 - 99 mg/dL 141(H) - - - 113(H)  Triglycerides 0 - 149 mg/dL 63 - - - 51  Creatinine 0.57 - 1.00 mg/dL 0.77 0.78 - - 0.66   BP/Weight 10/30/2021 08/01/2021 05/14/2021 04/18/2021 02/15/2021 01/31/2021 2/41/1464  Systolic BP 314 276 701 100 349 611 643  Diastolic BP 74 71 70 71 77 80 82  Wt. (Lbs) 169 160 163 168.8 171 174.8 175  BMI 31.42 26.63 30.3 31.38 31.79 33.03 32.53   Foot/eye exam completion dates Latest Ref Rng & Units 10/30/2021 11/01/2020  Eye Exam No Retinopathy - No Retinopathy  Foot Form Completion - Done -      Updated lab needed

## 2021-10-31 NOTE — Assessment & Plan Note (Signed)
Uncontrolled, change to azor 5/20 DASH diet and commitment to daily physical activity for a minimum of 30 minutes discussed and encouraged, as a part of hypertension management. The importance of attaining a healthy weight is also discussed.  BP/Weight 10/30/2021 08/01/2021 05/14/2021 04/18/2021 02/15/2021 01/31/2021 7/61/9509  Systolic BP 326 712 458 099 833 825 053  Diastolic BP 74 71 70 71 77 80 82  Wt. (Lbs) 169 160 163 168.8 171 174.8 175  BMI 31.42 26.63 30.3 31.38 31.79 33.03 32.53

## 2021-10-31 NOTE — Progress Notes (Signed)
Kathleen Kirby     MRN: [id number]      DOB: 19-Nov-1961  HPI: Patient is in for annual physical exam. Uncontrolled hypertension is addressed. Recent labs,  are reviewed. Immunization is reviewed , and  updated if needed.   PE: BP 138/74   Pulse 76   Resp 18   Ht 5' 1.5" (1.562 m)   Wt 169 lb (76.7 kg)   LMP 11/27/2012   SpO2 95%   BMI 31.42 kg/m   Pleasant  female, alert and oriented x 3, in no cardio-pulmonary distress. Afebrile. HEENT No facial trauma or asymetry. Sinuses non tender.  Extra occullar muscles intact.. External ears normal, . Neck: supple, no adenopathy,JVD or thyromegaly.No bruits.  Chest: Clear to ascultation bilaterally.No crackles or wheezes. Non tender to palpation  Breast: No asymetry,no masses or lumps. No tenderness. No nipple discharge or inversion. No axillary or supraclavicular adenopathy  Cardiovascular system; Heart sounds normal,  S1 and  S2 ,no S3.  No murmur, or thrill. Apical beat not displaced Peripheral pulses normal.  Abdomen: Soft, non tender, no organomegaly or masses. No bruits. Bowel sounds normal. No guarding, tenderness or rebound.   GU: External genitalia normal female genitalia , normal female distribution of hair. No lesions. Urethral meatus normal in size, no  Prolapse, no lesions visibly  Present. Bladder non tender. Vagina pink and moist , with no visible lesions , discharge present . Adequate pelvic support no  cystocele or rectocele noted Cervix pink and appears healthy, no lesions or ulcerations noted, no discharge noted from os Uterus normal size, no adnexal masses, no cervical motion or adnexal tenderness.   Musculoskeletal exam: Full ROM of spine, hips , shoulders and knees. No deformity ,swelling or crepitus noted. No muscle wasting or atrophy.   Neurologic: Cranial nerves 2 to 12 intact. Power, tone ,sensation and reflexes normal throughout. No disturbance in gait. No  tremor.  Skin: Intact, no ulceration, erythema , scaling or rash noted. Pigmentation normal throughout  Psych; Normal mood and affect. Judgement and concentration normal   Assessment & Plan:  Annual physical exam Annual exam as documented. Counseling done  re healthy lifestyle involving commitment to 150 minutes exercise per week, heart healthy diet, and attaining healthy weight.The importance of adequate sleep also discussed. Regular seat belt use and home safety, is also discussed. Changes in health habits are decided on by the patient with goals and time frames  set for achieving them. Immunization and cancer screening needs are specifically addressed at this visit.   Essential hypertension Uncontrolled, change to azor 5/20 DASH diet and commitment to daily physical activity for a minimum of 30 minutes discussed and encouraged, as a part of hypertension management. The importance of attaining a healthy weight is also discussed.  BP/Weight 10/30/2021 08/01/2021 05/14/2021 04/18/2021 02/15/2021 01/31/2021 1/60/1093  Systolic BP 235 573 220 254 270 623 762  Diastolic BP 74 71 70 71 77 80 82  Wt. (Lbs) 169 160 163 168.8 171 174.8 175  BMI 31.42 26.63 30.3 31.38 31.79 33.03 32.53       Type 2 diabetes mellitus with vascular disease ([place]) Ms. Ballester is reminded of the importance of commitment to daily physical activity for 30 minutes or more, as able and the need to limit carbohydrate intake to 30 to 60 grams per meal to help with blood sugar control.   The need to take medication as prescribed, test blood sugar as directed, and to call between visits if there is  a concern that blood sugar is uncontrolled is also discussed.   Ms. Pfefferle is reminded of the importance of daily foot exam, annual eye examination, and good blood sugar, blood pressure and cholesterol control.  Diabetic Labs Latest Ref Rng & Units 05/14/2021 12/27/2020 10/25/2020 09/06/2020 08/31/2020  HbA1c 4.8 - 5.6 % 7.0(H) -  7.1(A) - 6.8(H)  Microalbumin Not Estab. ug/mL - - - <3.0(H) -  Micro/Creat Ratio <30 mcg/mg creat - - - - -  Chol 100 - 199 mg/dL 230(H) - - - 204(H)  HDL >39 mg/dL 78 - - - 78  Calc LDL 0 - 99 mg/dL 141(H) - - - 113(H)  Triglycerides 0 - 149 mg/dL 63 - - - 51  Creatinine 0.57 - 1.00 mg/dL 0.77 0.78 - - 0.66   BP/Weight 10/30/2021 08/01/2021 05/14/2021 04/18/2021 02/15/2021 01/31/2021 7/32/2025  Systolic BP 427 062 376 283 151 761 607  Diastolic BP 74 71 70 71 77 80 82  Wt. (Lbs) 169 160 163 168.8 171 174.8 175  BMI 31.42 26.63 30.3 31.38 31.79 33.03 32.53   Foot/eye exam completion dates Latest Ref Rng & Units 10/30/2021 11/01/2020  Eye Exam No Retinopathy - No Retinopathy  Foot Form Completion - Done -      Updated lab needed

## 2021-11-01 LAB — LIPID PANEL
Chol/HDL Ratio: 2.5 ratio (ref 0.0–4.4)
Cholesterol, Total: 209 mg/dL — ABNORMAL HIGH (ref 100–199)
HDL: 84 mg/dL (ref >39)
LDL Chol Calc (NIH): 117 mg/dL — ABNORMAL HIGH (ref 0–99)
Triglycerides: 46 mg/dL (ref 0–149)
VLDL Cholesterol Cal: 8 mg/dL (ref 5–40)

## 2021-11-01 LAB — CBC
Hematocrit: 38.8 % (ref 34.0–46.6)
Hemoglobin: 12.4 g/dL (ref 11.1–15.9)
MCH: 24.9 pg — ABNORMAL LOW (ref 26.6–33.0)
MCHC: 32 g/dL (ref 31.5–35.7)
MCV: 78 fL — ABNORMAL LOW (ref 79–97)
Platelets: 327 10*3/uL (ref 150–450)
RBC: 4.98 x10E6/uL (ref 3.77–5.28)
RDW: 13.8 % (ref 11.7–15.4)
WBC: 6.5 10*3/uL (ref 3.4–10.8)

## 2021-11-01 LAB — BMP8+EGFR
BUN/Creatinine Ratio: 16 (ref 12–28)
BUN: 10 mg/dL (ref 8–27)
CO2: 23 mmol/L (ref 20–29)
Calcium: 10.5 mg/dL — ABNORMAL HIGH (ref 8.7–10.3)
Chloride: 97 mmol/L (ref 96–106)
Creatinine, Ser: 0.61 mg/dL (ref 0.57–1.00)
Glucose: 93 mg/dL (ref 70–99)
Potassium: 3.7 mmol/L (ref 3.5–5.2)
Sodium: 140 mmol/L (ref 134–144)
eGFR: 102 mL/min/{1.73_m2} (ref >59)

## 2021-11-01 LAB — HEMOGLOBIN A1C
Est. average glucose Bld gHb Est-mCnc: 154 mg/dL
Hgb A1c MFr Bld: 7 % — ABNORMAL HIGH (ref 4.8–5.6)

## 2021-11-01 LAB — MICROALBUMIN, URINE: Microalbumin, Urine: <3 ug/mL

## 2021-11-01 LAB — TSH: TSH: 1.66 u[IU]/mL (ref 0.450–4.500)

## 2021-11-01 LAB — VITAMIN D 25 HYDROXY (VIT D DEFICIENCY, FRACTURES): Vit D, 25-Hydroxy: 28.5 ng/mL — ABNORMAL LOW (ref 30.0–100.0)

## 2021-11-08 NOTE — Progress Notes (Signed)
Called patient and left message for them to return call at the office   

## 2021-11-11 ENCOUNTER — Telehealth: Payer: Self-pay

## 2021-11-11 ENCOUNTER — Other Ambulatory Visit: Payer: Self-pay

## 2021-11-11 LAB — CYTOLOGY - PAP
Comment: NEGATIVE
Comment: NEGATIVE
Diagnosis: REACTIVE — AB
HPV 16: NEGATIVE
HPV 18 / 45: NEGATIVE
High risk HPV: POSITIVE — AB

## 2021-11-11 MED ORDER — ROSUVASTATIN CALCIUM 5 MG PO TABS: ORAL_TABLET | ORAL | 1 refills | Status: DC

## 2021-11-11 NOTE — Telephone Encounter (Signed)
error 

## 2021-11-13 ENCOUNTER — Other Ambulatory Visit: Payer: Self-pay | Admitting: Family Medicine

## 2021-11-13 DIAGNOSIS — N879 Dysplasia of cervix uteri, unspecified: Secondary | ICD-10-CM

## 2021-11-13 DIAGNOSIS — B977 Papillomavirus as the cause of diseases classified elsewhere: Secondary | ICD-10-CM

## 2021-11-22 ENCOUNTER — Ambulatory Visit (HOSPITAL_COMMUNITY)
Admission: RE | Admit: 2021-11-22 | Discharge: 2021-11-22 | Disposition: A | Discharge status: Home / Self Care | Payer: Commercial Managed Care - PPO | Source: Ambulatory Visit | Attending: Family Medicine | Admitting: Family Medicine

## 2021-11-22 ENCOUNTER — Other Ambulatory Visit: Payer: Self-pay

## 2021-11-22 DIAGNOSIS — Z1231 Encounter for screening mammogram for malignant neoplasm of breast: Secondary | ICD-10-CM | POA: Insufficient documentation

## 2021-12-26 ENCOUNTER — Ambulatory Visit (INDEPENDENT_AMBULATORY_CARE_PROVIDER_SITE_OTHER): Payer: Commercial Managed Care - PPO | Admitting: Obstetrics & Gynecology

## 2021-12-26 ENCOUNTER — Other Ambulatory Visit: Payer: Self-pay

## 2021-12-26 ENCOUNTER — Encounter: Payer: Self-pay | Admitting: Obstetrics & Gynecology

## 2021-12-26 ENCOUNTER — Ambulatory Visit: Payer: Commercial Managed Care - PPO | Admitting: Family Medicine

## 2021-12-26 VITALS — BP 122/67 | HR 78 | Ht 61.5 in | Wt 173.0 lb

## 2021-12-26 DIAGNOSIS — R87618 Other abnormal cytological findings on specimens from cervix uteri: Secondary | ICD-10-CM

## 2021-12-26 DIAGNOSIS — B977 Papillomavirus as the cause of diseases classified elsewhere: Secondary | ICD-10-CM | POA: Diagnosis not present

## 2021-12-26 NOTE — Progress Notes (Signed)
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 New sexual partner:  No.  : time frame:  No.  History of abnormal Pap: yes  Procedure Details  The risks and benefits of the procedure and Written informed consent obtained.  Speculum placed in vagina and excellent visualization of cervix achieved, cervix swabbed x 3 with acetic acid solution.  Findings: Adequate colposcopy is noted today.  Cervix: no visible lesions, no mosaicism, no punctation, and no abnormal vasculature; SCJ visualized 360 degrees without lesions and no biopsies taken. Vaginal inspection: normal without visible lesions. Vulvar colposcopy: vulvar colposcopy not performed.  Specimens: none  Complications: none.  Colposcopic Impression: NormaL COLPOSCOPY  Plan(Based on 2019 ASCCP recommendations) Repeat HPV based cytology 1 year

## 2022-01-09 ENCOUNTER — Ambulatory Visit: Payer: Commercial Managed Care - PPO | Admitting: Family Medicine

## 2022-01-09 ENCOUNTER — Encounter: Payer: Self-pay | Admitting: Family Medicine

## 2022-01-09 ENCOUNTER — Other Ambulatory Visit: Payer: Self-pay

## 2022-01-09 VITALS — BP 137/78 | HR 81 | Resp 16 | Ht 62.0 in | Wt 168.4 lb

## 2022-01-09 DIAGNOSIS — E669 Obesity, unspecified: Secondary | ICD-10-CM

## 2022-01-09 DIAGNOSIS — M5441 Lumbago with sciatica, right side: Secondary | ICD-10-CM

## 2022-01-09 DIAGNOSIS — I10 Essential (primary) hypertension: Secondary | ICD-10-CM | POA: Diagnosis not present

## 2022-01-09 DIAGNOSIS — M79661 Pain in right lower leg: Secondary | ICD-10-CM

## 2022-01-09 DIAGNOSIS — R87618 Other abnormal cytological findings on specimens from cervix uteri: Secondary | ICD-10-CM

## 2022-01-09 DIAGNOSIS — E785 Hyperlipidemia, unspecified: Secondary | ICD-10-CM

## 2022-01-09 DIAGNOSIS — M7989 Other specified soft tissue disorders: Secondary | ICD-10-CM

## 2022-01-09 DIAGNOSIS — E1159 Type 2 diabetes mellitus with other circulatory complications: Secondary | ICD-10-CM

## 2022-01-09 MED ORDER — PREDNISONE 10 MG PO TABS: 10.0000 mg | ORAL_TABLET | Freq: Two times a day (BID) | ORAL | 0 refills | Status: DC

## 2022-01-09 NOTE — Assessment & Plan Note (Signed)
Controlled, no change in medication DASH diet and commitment to daily physical activity for a minimum of 30 minutes discussed and encouraged, as a part of hypertension management. The importance of attaining a healthy weight is also discussed.  BP/Weight 01/09/2022 12/26/2021 10/30/2021 08/01/2021 05/14/2021 05/16/8240 06/18/3009  Systolic BP 404 591 368 599 234 144 360  Diastolic BP 78 67 74 71 70 71 77  Wt. (Lbs) 168.4 173 169 160 163 168.8 171  BMI 30.8 32.16 31.42 26.63 30.3 31.38 31.79

## 2022-01-09 NOTE — Progress Notes (Signed)
Kathleen Kirby     MRN: [id number]      DOB: Jan 08, 1961   HPI [name] is here for follow up and re-evaluation of chronic medical conditions, in particular uncontrolled hypertension, medication management and review of any available recent lab and radiology data.  Preventive health is updated, specifically  Cancer screening and Immunization.   Questions or concerns regarding consultations or procedures which the PT has had in the interim are  addressed.Has had Gyne eval and had normal  colposcopy on 12/26/2021  1  week h/o acute onset  LBP radiating down posterior right leg to knee, was a 10 1 week ago, could barely walk, now rated between 5 to 7, also some swelling of leg noted. Denies cough, hemoptysis, or dyspnea. No known back problems ROS Denies recent fever or chills. Denies sinus pressure, nasal congestion, ear pain or sore throat. Denies chest congestion, productive cough or wheezing. Denies chest pains, palpitations and leg swelling Denies abdominal pain, nausea, vomiting,diarrhea or constipation.   Denies dysuria, frequency, hesitancy or incontinence. Denies depression, anxiety c/o  insomnia.unable to find a comfortable positin Denies skin break down or rash.   PE  BP 137/78    Pulse 81    Resp 16    Ht 5\' 2"  (1.575 m)    Wt 168 lb 6.4 oz (76.4 kg)    LMP 11/27/2012    SpO2 95%    BMI 30.80 kg/m   Patient alert and oriented and in no cardiopulmonary distress.  HEENT: No facial asymmetry, EOMI,     Neck supple .  Chest: Clear to auscultation bilaterally.  CVS: S1, S2 no murmurs, no S3.Regular rate.  ABD: Soft non tender.   Ext: RLE swelling and tender behind right calf  MS: decreased ROM spine, adequate in shoulders, hips and knees.  Skin: Intact, no ulcerations or rash noted.  Psych: Good eye contact, normal affect. Memory intact not anxious mildly  depressed due to new disabling pain  CNS: CN 2-12 intact, power,  normal throughout.no focal deficits  noted.   Assessment & Plan  Low back pain Acute onset with radicular symptoms x 1 week, x ray and short course of prednisone  Essential hypertension Controlled, no change in medication DASH diet and commitment to daily physical activity for a minimum of 30 minutes discussed and encouraged, as a part of hypertension management. The importance of attaining a healthy weight is also discussed.  BP/Weight 01/09/2022 12/26/2021 10/30/2021 08/01/2021 05/14/2021 05/16/8365 01/24/4764  Systolic BP 465 035 465 681 275 170 017  Diastolic BP 78 67 74 71 70 71 77  Wt. (Lbs) 168.4 173 169 160 163 168.8 171  BMI 30.8 32.16 31.42 26.63 30.3 31.38 31.79       Hyperlipidemia LDL goal <100 Hyperlipidemia:Low fat diet discussed and encouraged.   Lipid Panel  Lab Results  Component Value Date   CHOL 209 (H) 10/30/2021   HDL 84 10/30/2021   LDLCALC 117 (H) 10/30/2021   TRIG 46 10/30/2021   CHOLHDL 2.5 10/30/2021     Updated lab needed at/ before next visit.   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  Resists mediction though indicated ,  will further address with lab update  Kathleen Kirby of the importance of daily foot exam, annual eye examination, and good blood sugar, blood pressure and cholesterol control.  Diabetic Labs Latest Ref Rng & Units 10/30/2021 05/14/2021 12/27/2020 10/25/2020 09/06/2020  HbA1c 4.8 - 5.6 % 7.0(H) 7.0(H) - 7.1(A) -  Microalbumin Not Estab. ug/mL - - - - <3.0(H)  Micro/Creat Ratio <30 mcg/mg creat - - - - -  Chol 100 - 199 mg/dL 209(H) 230(H) - - -  HDL >39 mg/dL 84 78 - - -  Calc LDL 0 - 99 mg/dL 117(H) 141(H) - - -  Triglycerides 0 - 149 mg/dL  46 63 - - -  Creatinine 0.57 - 1.00 mg/dL 0.61 0.77 0.78 - -   BP/Weight 01/09/2022 12/26/2021 10/30/2021 08/01/2021 05/14/2021 08/19/7095 01/22/3661  Systolic BP 947 654 650 354 656 812 751  Diastolic BP 78 67 74 71 70 71 77  Wt. (Lbs) 168.4 173 169 160 163 168.8 171  BMI 30.8 32.16 31.42 26.63 30.3 31.38 31.79   Foot/eye exam completion dates Latest Ref Rng & Units 10/30/2021 11/01/2020  Eye Exam No Retinopathy - No Retinopathy  Foot Form Completion - Done -      Updated lab needed at/ before next visit.   Pain and swelling of right lower leg [place] to r/o DVT, acute onset x 1 week, right posterior calf tenderness  Obesity (BMI 30.0-34.9)  Patient re-educated about  the importance of commitment to a  minimum of 150 minutes of exercise per week as able.  The importance of healthy food choices with portion control discussed, as well as eating regularly and within a 12 hour window most days. The need to choose "clean , green" food 50 to 75% of the time is discussed, as well as to make water the primary drink and set a goal of 64 ounces water daily.    Weight /BMI 01/09/2022 12/26/2021 10/30/2021  WEIGHT 168 lb 6.4 oz 173 lb 169 lb  HEIGHT 5\' 2"  5' 1.5" 5' 1.5"  BMI 30.8 kg/m2 32.16 kg/m2 31.42 kg/m2

## 2022-01-09 NOTE — Assessment & Plan Note (Signed)
°  Patient re-educated about  the importance of commitment to a  minimum of 150 minutes of exercise per week as able.  The importance of healthy food choices with portion control discussed, as well as eating regularly and within a 12 hour window most days. The need to choose "clean , green" food 50 to 75% of the time is discussed, as well as to make water the primary drink and set a goal of 64 ounces water daily.    Weight /BMI 01/09/2022 12/26/2021 10/30/2021  WEIGHT 168 lb 6.4 oz 173 lb 169 lb  HEIGHT 5\' 2"  5' 1.5" 5' 1.5"  BMI 30.8 kg/m2 32.16 kg/m2 31.42 kg/m2

## 2022-01-09 NOTE — Assessment & Plan Note (Signed)
rept pap in 1 year

## 2022-01-09 NOTE — Assessment & Plan Note (Signed)
Acute onset with radicular symptoms x 1 week, x ray and short course of prednisone

## 2022-01-09 NOTE — Assessment & Plan Note (Signed)
Hyperlipidemia:Low fat diet discussed and encouraged.   Lipid Panel  Lab Results  Component Value Date   CHOL 209 (H) 10/30/2021   HDL 84 10/30/2021   LDLCALC 117 (H) 10/30/2021   TRIG 46 10/30/2021   CHOLHDL 2.5 10/30/2021     Updated lab needed at/ before next visit.

## 2022-01-09 NOTE — Patient Instructions (Addendum)
F/U mid March, call if you need me sooner  Fasting lipid, cmp and eGFr and hBA1C 3 days before visit.  Xray of ;low back and [place] of right leg as scheduled  5 day course of prednisone for pain at [facility]  Blood pressure is good  Thanks for choosing [facility], we consider it a privelige to serve you.

## 2022-01-09 NOTE — Assessment & Plan Note (Signed)
[place] to r/o DVT, acute onset x 1 week, right posterior calf tenderness

## 2022-01-09 NOTE — Assessment & Plan Note (Signed)
Kathleen Kirby is reminded of the importance of commitment to daily physical activity for 30 minutes or more, as able and the need to limit carbohydrate intake to 30 to 60 grams per meal to help with blood sugar control.  Resists mediction though indicated , will further address with lab update  Kathleen Kirby is reminded of the importance of daily foot exam, annual eye examination, and good blood sugar, blood pressure and cholesterol control.  Diabetic Labs Latest Ref Rng & Units 10/30/2021 05/14/2021 12/27/2020 10/25/2020 09/06/2020  HbA1c 4.8 - 5.6 % 7.0(H) 7.0(H) - 7.1(A) -  Microalbumin Not Estab. ug/mL - - - - <3.0(H)  Micro/Creat Ratio <30 mcg/mg creat - - - - -  Chol 100 - 199 mg/dL 209(H) 230(H) - - -  HDL >39 mg/dL 84 78 - - -  Calc LDL 0 - 99 mg/dL 117(H) 141(H) - - -  Triglycerides 0 - 149 mg/dL 46 63 - - -  Creatinine 0.57 - 1.00 mg/dL 0.61 0.77 0.78 - -   BP/Weight 01/09/2022 12/26/2021 10/30/2021 08/01/2021 05/14/2021 7/0/9295 06/18/7339  Systolic BP 370 964 383 818 403 754 360  Diastolic BP 78 67 74 71 70 71 77  Wt. (Lbs) 168.4 173 169 160 163 168.8 171  BMI 30.8 32.16 31.42 26.63 30.3 31.38 31.79   Foot/eye exam completion dates Latest Ref Rng & Units 10/30/2021 11/01/2020  Eye Exam No Retinopathy - No Retinopathy  Foot Form Completion - Done -      Updated lab needed at/ before next visit.

## 2022-01-10 ENCOUNTER — Ambulatory Visit (HOSPITAL_COMMUNITY)
Admission: RE | Admit: 2022-01-10 | Discharge: 2022-01-10 | Disposition: A | Discharge status: Home / Self Care | Payer: Commercial Managed Care - PPO | Source: Ambulatory Visit | Attending: Family Medicine | Admitting: Family Medicine

## 2022-01-10 ENCOUNTER — Telehealth: Payer: Self-pay

## 2022-01-10 DIAGNOSIS — M79661 Pain in right lower leg: Secondary | ICD-10-CM

## 2022-01-10 DIAGNOSIS — M7989 Other specified soft tissue disorders: Secondary | ICD-10-CM | POA: Insufficient documentation

## 2022-01-10 NOTE — Telephone Encounter (Signed)
Patient came to office to speak to nurse, was just seen yesterday in office and not sure how she will be feeling on Monday for work, needs a work note.

## 2022-01-10 NOTE — Telephone Encounter (Signed)
States she will go to work Monday and if she cannot work she will call us about a work note

## 2022-01-10 NOTE — Telephone Encounter (Signed)
Just completed the scans and doesn't think she'll feel like going to work Monday and wants a note. She is in the lobby. I told her you would probably want to wait for the results of the scans but to have a seat.

## 2022-01-10 NOTE — Telephone Encounter (Signed)
Patient asked if nurse Velna Hatchet would giver her a call

## 2022-02-14 ENCOUNTER — Encounter: Payer: Self-pay | Admitting: Family Medicine

## 2022-02-27 ENCOUNTER — Other Ambulatory Visit: Payer: Self-pay

## 2022-02-27 ENCOUNTER — Encounter: Payer: Self-pay | Admitting: Family Medicine

## 2022-02-27 ENCOUNTER — Ambulatory Visit (HOSPITAL_COMMUNITY)
Admission: RE | Admit: 2022-02-27 | Discharge: 2022-02-27 | Disposition: A | Discharge status: Home / Self Care | Payer: Commercial Managed Care - PPO | Source: Ambulatory Visit | Attending: Family Medicine | Admitting: Family Medicine

## 2022-02-27 ENCOUNTER — Other Ambulatory Visit: Payer: Self-pay | Admitting: Family Medicine

## 2022-02-27 ENCOUNTER — Ambulatory Visit: Payer: Commercial Managed Care - PPO | Admitting: Family Medicine

## 2022-02-27 VITALS — BP 125/67 | HR 75 | Resp 16 | Ht 62.0 in | Wt 166.0 lb

## 2022-02-27 DIAGNOSIS — M25561 Pain in right knee: Secondary | ICD-10-CM | POA: Insufficient documentation

## 2022-02-27 DIAGNOSIS — E785 Hyperlipidemia, unspecified: Secondary | ICD-10-CM | POA: Diagnosis not present

## 2022-02-27 DIAGNOSIS — E669 Obesity, unspecified: Secondary | ICD-10-CM

## 2022-02-27 DIAGNOSIS — R936 Abnormal findings on diagnostic imaging of limbs: Secondary | ICD-10-CM

## 2022-02-27 DIAGNOSIS — E1159 Type 2 diabetes mellitus with other circulatory complications: Secondary | ICD-10-CM | POA: Diagnosis not present

## 2022-02-27 DIAGNOSIS — E66811 Obesity, class 1: Secondary | ICD-10-CM

## 2022-02-27 LAB — POCT GLYCOSYLATED HEMOGLOBIN (HGB A1C): HbA1c, POC (controlled diabetic range): 7 % (ref 0.0–7.0)

## 2022-02-27 MED ORDER — PREDNISONE 10 MG PO TABS: 10.0000 mg | ORAL_TABLET | Freq: Two times a day (BID) | ORAL | 0 refills | Status: DC

## 2022-02-27 NOTE — Patient Instructions (Addendum)
F/U in 4  months, call if you need me sooner ? ?Nurse please give papers and short discussion on diabetic ed re diet , and also refer her to diabetic educator ? ?Blood sugar unchanged  at 7.0, this is still too high, so keep working on it ? ?Blood pressure is good ? ? X ray of right knee today and short course of prednisone for acute pain ? ?Thanks for choosing [facility], we consider it a privelige to serve you. ? ?Needs fasting lipid, cmp and EGFr and HBa1C 5 days before next appt, added after d/c, will need to contact patient and order labs ? ?

## 2022-02-27 NOTE — Assessment & Plan Note (Addendum)
X ray of right knee, and short course of prednison. Xray positive for effusion and possible abnormality in distal femur, refer Ortho asap ?

## 2022-02-27 NOTE — Progress Notes (Signed)
? ?Kathleen Kirby     MRN: [id number]      DOB: [birth year] ? ? ?HPI ?Kathleen Kirby is here  with a 4 day h/o acute pain and swelling of right kneee, with difficulty bending the knee, no inciting trauma. ?Chronic medical conditons are also addressed and recent labs reviewed ?ROS ?Denies recent fever or chills. ?Denies sinus pressure, nasal congestion, ear pain or sore throat. ?Denies chest congestion, productive cough or wheezing. ?. ?Denies headaches, seizures, numbness, or tingling. ? ?Denies skin break down or rash. ? ? ?PE ? ?BP 125/67   Pulse 75   Resp 16   Ht '5\' 2"'$  (1.575 m)   Wt 166 lb (75.3 kg)   LMP 11/27/2012   SpO2 94%   BMI 30.36 kg/m?  ? ?Patient alert and oriented and in no cardiopulmonary distress.Pt in pain ? ?HEENT: No facial asymmetry, EOMI,     Neck supple . ? ?Chest: Clear to auscultation bilaterally. ? ?CVS: S1, S2 no murmurs, no S3.Regular rate. ? ?ABD: Soft non tender.  ? ?Ext: No edema ? ?MS: Adequate ROM spine, markedly reduced ROM right knee, no warmth or erythema noted, no point tenderness, swollen. ? ?Skin: Intact, no ulcerations or rash noted. ? ?Psych: Good eye contact, normal affect. Memory intact not anxious or depressed appearing. ? ?CNS: CN 2-12 intact, power,  normal throughout.no focal deficits noted. ? ? ?Assessment & Plan ? ?Anterior knee pain, right ?X ray of right knee, and short course of prednison. Xray positive for effusion and possible abnormality in distal femur, refer Ortho asap ? ?Type 2 diabetes mellitus with vascular disease ([place]) ?Kathleen Kirby is reminded of the importance of commitment to daily physical activity for 30 minutes or more, as able and the need to limit carbohydrate intake to 30 to 60 grams per meal to help with blood sugar control.  ? ?The need to take medication as prescribed, test blood sugar as directed, and to call between visits if there is a concern that blood sugar is uncontrolled is also discussed.  ? ?Kathleen Kirby is reminded of the importance  of daily foot exam, annual eye examination, and good blood sugar, blood pressure and cholesterol control. ? ?Diabetic Labs Latest Ref Rng & Units 02/27/2022 10/30/2021 05/14/2021 12/27/2020 10/25/2020  ?HbA1c 0.0 - 7.0 % 7.0 7.0(H) 7.0(H) - 7.1(A)  ?Microalbumin Not Estab. ug/mL - - - - -  ?Micro/Creat Ratio <30 mcg/mg creat - - - - -  ?Chol 100 - 199 mg/dL - 209(H) 230(H) - -  ?HDL >39 mg/dL - 84 78 - -  ?Calc LDL 0 - 99 mg/dL - 117(H) 141(H) - -  ?Triglycerides 0 - 149 mg/dL - 46 63 - -  ?Creatinine 0.57 - 1.00 mg/dL - 0.61 0.77 0.78 -  ? ?BP/Weight 02/27/2022 01/09/2022 12/26/2021 10/30/2021 08/01/2021 05/14/2021 04/18/2021  ?Systolic BP 308 657 846 962 129 132 148  ?Diastolic BP 67 78 67 74 71 70 71  ?Wt. (Lbs) 166 168.4 173 169 160 163 168.8  ?BMI 30.36 30.8 32.16 31.42 26.63 30.3 31.38  ? ?Foot/eye exam completion dates Latest Ref Rng & Units 10/30/2021 11/01/2020  ?Eye Exam No Retinopathy - No Retinopathy  ?Foot Form Completion - Done -  ? ?Unchanged , needs medication but is resisting ? ? ? ? ?Hyperlipidemia LDL goal <100 ? ?Hyperlipidemia:Low fat diet discussed and encouraged. ? ? ?Lipid Panel  ?Lab Results  ?Component Value Date  ? CHOL 209 (H) 10/30/2021  ? HDL 84 10/30/2021  ?  LDLCALC 117 (H) 10/30/2021  ? TRIG 46 10/30/2021  ? CHOLHDL 2.5 10/30/2021  ? ?Not at goal ?Updated lab needed at/ before next visit. ? ? ? ? ? ? ?Obesity (BMI 30.0-34.9) ? ?Patient re-educated about  the importance of commitment to a  minimum of 150 minutes of exercise per week as able. ? ?The importance of healthy food choices with portion control discussed, as well as eating regularly and within a 12 hour window most days. ?The need to choose "clean , green" food 50 to 75% of the time is discussed, as well as to make water the primary drink and set a goal of 64 ounces water daily. ? ?  ?Weight /BMI 02/27/2022 01/09/2022 12/26/2021  ?WEIGHT 166 lb 168 lb 6.4 oz 173 lb  ?HEIGHT '5\' 2"'$  '5\' 2"'$  5' 1.5"  ?BMI 30.36 kg/m2 30.8 kg/m2 32.16 kg/m2   ? ? ? ? ?

## 2022-03-02 ENCOUNTER — Encounter: Payer: Self-pay | Admitting: Family Medicine

## 2022-03-02 NOTE — Assessment & Plan Note (Signed)
Kathleen Kirby is reminded of the importance of commitment to daily physical activity for 30 minutes or more, as able and the need to limit carbohydrate intake to 30 to 60 grams per meal to help with blood sugar control.  ? ?The need to take medication as prescribed, test blood sugar as directed, and to call between visits if there is a concern that blood sugar is uncontrolled is also discussed.  ? ?Kathleen Kirby is reminded of the importance of daily foot exam, annual eye examination, and good blood sugar, blood pressure and cholesterol control. ? ?Diabetic Labs Latest Ref Rng & Units 02/27/2022 10/30/2021 05/14/2021 12/27/2020 10/25/2020  ?HbA1c 0.0 - 7.0 % 7.0 7.0(H) 7.0(H) - 7.1(A)  ?Microalbumin Not Estab. ug/mL - - - - -  ?Micro/Creat Ratio <30 mcg/mg creat - - - - -  ?Chol 100 - 199 mg/dL - 209(H) 230(H) - -  ?HDL >39 mg/dL - 84 78 - -  ?Calc LDL 0 - 99 mg/dL - 117(H) 141(H) - -  ?Triglycerides 0 - 149 mg/dL - 46 63 - -  ?Creatinine 0.57 - 1.00 mg/dL - 0.61 0.77 0.78 -  ? ?BP/Weight 02/27/2022 01/09/2022 12/26/2021 10/30/2021 08/01/2021 05/14/2021 04/18/2021  ?Systolic BP 240 973 532 992 129 132 148  ?Diastolic BP 67 78 67 74 71 70 71  ?Wt. (Lbs) 166 168.4 173 169 160 163 168.8  ?BMI 30.36 30.8 32.16 31.42 26.63 30.3 31.38  ? ?Foot/eye exam completion dates Latest Ref Rng & Units 10/30/2021 11/01/2020  ?Eye Exam No Retinopathy - No Retinopathy  ?Foot Form Completion - Done -  ? ?Unchanged , needs medication but is resisting ? ? ? ?

## 2022-03-02 NOTE — Assessment & Plan Note (Signed)
?  Hyperlipidemia:Low fat diet discussed and encouraged. ? ? ?Lipid Panel  ?Lab Results  ?Component Value Date  ? CHOL 209 (H) 10/30/2021  ? HDL 84 10/30/2021  ? LDLCALC 117 (H) 10/30/2021  ? TRIG 46 10/30/2021  ? CHOLHDL 2.5 10/30/2021  ? ?Not at goal ?Updated lab needed at/ before next visit. ? ? ? ? ? ?

## 2022-03-02 NOTE — Assessment & Plan Note (Signed)
?  Patient re-educated about  the importance of commitment to a  minimum of 150 minutes of exercise per week as able. ? ?The importance of healthy food choices with portion control discussed, as well as eating regularly and within a 12 hour window most days. ?The need to choose "clean , green" food 50 to 75% of the time is discussed, as well as to make water the primary drink and set a goal of 64 ounces water daily. ? ?  ?Weight /BMI 02/27/2022 01/09/2022 12/26/2021  ?WEIGHT 166 lb 168 lb 6.4 oz 173 lb  ?HEIGHT '5\' 2"'$  '5\' 2"'$  5' 1.5"  ?BMI 30.36 kg/m2 30.8 kg/m2 32.16 kg/m2  ? ? ? ?

## 2022-03-03 ENCOUNTER — Other Ambulatory Visit: Payer: Self-pay

## 2022-03-03 ENCOUNTER — Encounter: Payer: Self-pay | Admitting: Orthopedic Surgery

## 2022-03-03 ENCOUNTER — Ambulatory Visit: Payer: Commercial Managed Care - PPO | Admitting: Orthopedic Surgery

## 2022-03-03 DIAGNOSIS — M25461 Effusion, right knee: Secondary | ICD-10-CM | POA: Diagnosis not present

## 2022-03-03 DIAGNOSIS — E1159 Type 2 diabetes mellitus with other circulatory complications: Secondary | ICD-10-CM

## 2022-03-03 DIAGNOSIS — E785 Hyperlipidemia, unspecified: Secondary | ICD-10-CM

## 2022-03-03 DIAGNOSIS — M25561 Pain in right knee: Secondary | ICD-10-CM | POA: Diagnosis not present

## 2022-03-03 DIAGNOSIS — I10 Essential (primary) hypertension: Secondary | ICD-10-CM

## 2022-03-06 ENCOUNTER — Encounter: Payer: Self-pay | Admitting: Orthopedic Surgery

## 2022-03-06 MED ORDER — BUPIVACAINE HCL 0.25 % IJ SOLN
4.0000 mL | INTRAMUSCULAR | Status: AC | PRN
  Administered 2022-03-03: 4 mL via INTRA_ARTICULAR

## 2022-03-06 MED ORDER — LIDOCAINE HCL 1 % IJ SOLN
5.0000 mL | INTRAMUSCULAR | Status: AC | PRN
  Administered 2022-03-03: 5 mL

## 2022-03-06 MED ORDER — METHYLPREDNISOLONE ACETATE 40 MG/ML IJ SUSP
40.0000 mg | INTRAMUSCULAR | Status: AC | PRN
  Administered 2022-03-03: 40 mg via INTRA_ARTICULAR

## 2022-03-06 NOTE — Progress Notes (Signed)
? ?Office Visit Note ?  ?Patient: Kathleen Kirby           ?Date of Birth: [birth year]           ?MRN: [id number] ?Visit Date: 03/03/2022 ?Requested by: Fayrene Helper, MD ?[address] ?[place],  [place] [address] ?PCP: Fayrene Helper, MD ? ?Subjective: ?Chief Complaint  ?Patient presents with  ? Right Knee - New Patient (Initial Visit)  ? ? ?HPI: Kathleen Kirby is a 61 year old female with right knee pain since early January.  Denies any history of injury.  Pain has gotten worse.  She reports swelling as well as pain which wakes her from sleep at night.  No fevers or chills and she does not state that the knee is warm to touch.  Reports decreased range of motion.  She does 12-hour shifts and works on ladders sometimes.  She has done the same job for 17 years.  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  Hurts her to squat.  No other joints are giving her a problem at this time.  She currently is finishing a prednisone Dosepak.  She also takes extra strength Tylenol.  She has had plain radiographs of the knee which demonstrate no arthritis.  There are anterior cortical lucencies seen on plain radiographs of the distal femur which do not have concerning radiographic characteristics for malignancy. ?             ?ROS: All systems reviewed are negative as they relate to the chief complaint within the history of present illness.  Patient denies  fevers or chills. ? ? ?Assessment & Plan: ?Visit Diagnoses:  ?1. Right knee pain, unspecified chronicity   ? ? ?Plan: Impression is right knee pain and effusion with normal radiographs except lucencies in the cortex of the distal femur.  Plan today is aspiration and injection on the right knee for pain relief.  She also needs MRI scan of the right knee to evaluate source of the effusion which is likely degenerative meniscal tear.  We also need to get some evaluation of these distal femoral lucencies which appear to be consistent with fibrous cortical defects or  some other type of benign cystic cortical lesion.  Follow-up after that study ? ?Follow-Up Instructions: Return for after MRI.  ? ?Orders:  ?Orders Placed This Encounter  ?Procedures  ? MR Knee Right w/o contrast  ? ?No orders of the defined types were placed in this encounter. ? ? ? ? Procedures: ?Large Joint Inj: R knee on 03/03/2022 6:25 AM ?Indications: diagnostic evaluation, joint swelling and pain ?Details: 18 G 1.5 in needle, superolateral approach ? ?Arthrogram: No ? ?Medications: 5 mL lidocaine 1 %; 40 mg methylPREDNISolone acetate 40 MG/ML; 4 mL bupivacaine 0.25 % ?Aspirate: 30 mL yellow ?Outcome: tolerated well, no immediate complications ?Procedure, treatment alternatives, risks and benefits explained, specific risks discussed. Consent was given by the patient. Immediately prior to procedure a time out was called to verify the correct patient, procedure, equipment, support staff and site/side marked as required. Patient was prepped and draped in the usual sterile fashion.  ? ? ? ? ?Clinical Data: ?No additional findings. ? ?Objective: ?Vital Signs: LMP 11/27/2012  ? ?Physical Exam:  ? ?Constitutional: Patient appears well-developed ?HEENT:  ?Head: Normocephalic ?Eyes:EOM are normal ?Neck: Normal range of motion ?Cardiovascular: Normal rate ?Pulmonary/chest: Effort normal ?Neurologic: Patient is alert ?Skin: Skin is warm ?Psychiatric: Patient has normal mood and affect ? ? ?Ortho Exam:  Ortho exam demonstrates full range of motion of that right knee with mild effusion.  Has medial greater than lateral joint line tenderness.  Extensor mechanism intact.  No proximal lymphadenopathy.  Collateral and cruciate ligaments are stable.  Pedal pulses palpable.  Patella tracks well without significant crepitus. ? ?Specialty Comments:  ?No specialty comments available. ? ?Imaging: ?No results found. ? ? ?PMFS History: ?Patient Active Problem List  ? Diagnosis Date Noted  ? Anterior knee pain, right 02/27/2022  ? Pain  and swelling of right lower leg 01/09/2022  ? Pap smear abnormality of cervix/human papillomavirus (HPV) positive 05/10/2017  ? Type 2 diabetes mellitus with vascular disease ([place]) 03/26/2016  ? Hyperlipidemia LDL goal <100 05/14/2013  ? Low back pain 09/19/2009  ? Obesity (BMI 30.0-34.9) 03/02/2008  ? Essential hypertension 03/02/2008  ? ?Past Medical History:  ?Diagnosis Date  ? Allergy   ? Anxiety about health 07/20/2017  ? Back pain   ? Bilateral shoulder pain   ? Diabetes mellitus without complication ([place])   ? FH: CAD (coronary artery disease) 09/14/2014  ? Sibling with MI under age 33   ? Hyperlipidemia   ? Hypertension   ? Metabolic syndrome X 7/32/2025  ? Mild dysplasia of cervix 12/21/2013  ? Obesity   ? Right hip pain   ?  ?Family History  ?Problem Relation Age of Onset  ? Heart disease Mother   ? Coronary artery disease Father   ? Heart disease Father   ? Lung cancer Brother   ? Brain cancer Brother   ? Diabetes Sister   ? Coronary artery disease Sister   ? Hypertension Sister   ?  ?History reviewed. No pertinent surgical history. ?Social History  ? ?Occupational History  ? Occupation: Glass blower/designer  ?  Comment: tobacco factory  ?Tobacco Use  ? Smoking status: Never  ? Smokeless tobacco: Never  ?Vaping Use  ? Vaping Use: Never used  ?Substance and Sexual Activity  ? Alcohol use: No  ? Drug use: No  ? Sexual activity: Yes  ?  Partners: Male  ?  Birth control/protection: None  ? ? ? ? ? ?

## 2022-03-10 LAB — COLOGUARD: COLOGUARD: NEGATIVE

## 2022-03-22 ENCOUNTER — Ambulatory Visit
Admission: RE | Admit: 2022-03-22 | Discharge: 2022-03-22 | Disposition: A | Discharge status: Home / Self Care | Payer: Commercial Managed Care - PPO | Source: Ambulatory Visit | Attending: Surgical | Admitting: Surgical

## 2022-03-22 DIAGNOSIS — M25561 Pain in right knee: Secondary | ICD-10-CM

## 2022-03-26 ENCOUNTER — Other Ambulatory Visit: Payer: Self-pay | Admitting: Family Medicine

## 2022-03-27 ENCOUNTER — Other Ambulatory Visit: Payer: Self-pay | Admitting: Family Medicine

## 2022-03-28 NOTE — Progress Notes (Signed)
You ordered

## 2022-03-29 NOTE — Progress Notes (Signed)
Hi Kathleen Kirby can you get her a fu thx

## 2022-04-02 NOTE — Progress Notes (Signed)
Left msg

## 2022-04-10 ENCOUNTER — Encounter: Payer: Commercial Managed Care - PPO | Attending: Family Medicine | Admitting: Nutrition

## 2022-04-10 VITALS — Ht 61.5 in | Wt 163.0 lb

## 2022-04-10 DIAGNOSIS — E1159 Type 2 diabetes mellitus with other circulatory complications: Secondary | ICD-10-CM | POA: Diagnosis not present

## 2022-04-10 DIAGNOSIS — E785 Hyperlipidemia, unspecified: Secondary | ICD-10-CM

## 2022-04-10 DIAGNOSIS — E669 Obesity, unspecified: Secondary | ICD-10-CM | POA: Diagnosis not present

## 2022-04-10 DIAGNOSIS — Z6830 Body mass index (BMI) 30.0-30.9, adult: Secondary | ICD-10-CM | POA: Diagnosis not present

## 2022-04-10 DIAGNOSIS — Z713 Dietary counseling and surveillance: Secondary | ICD-10-CM | POA: Insufficient documentation

## 2022-04-10 DIAGNOSIS — I10 Essential (primary) hypertension: Secondary | ICD-10-CM | POA: Diagnosis not present

## 2022-04-10 DIAGNOSIS — E66811 Obesity, class 1: Secondary | ICD-10-CM

## 2022-04-10 NOTE — Patient Instructions (Addendum)
Goals  Eat three meals per day Prep meals and take to work Walking at lunch- goal is 30 minutes 5 times per week. Focus on plant based whole foods Go to [email or web address] Lose 1 lb per week Cut out diet sodas. You would benefit from a meter and testing supplies to monitor your blood sugars at home. Talk to your PCP for prescription.

## 2022-04-10 NOTE — Progress Notes (Signed)
Medical Nutrition Therapy  Appointment Start time:  [id number]  Appointment End time:  42  Primary concerns today: DM Ty[e 2. obesity  Referral diagnosis: E11.8, e66.9 Preferred learning style: No preference. Learning readiness: Changes in progress    NUTRITION ASSESSMENT  61 yr old bfemale with Type 2 DM, Hyperlipidemia and HTN and Obesity. Had been walking 5 miles per day but hasn't recently due to having knee and leg issues. Had an MRI and US/Xray, PCP Dr. Moshe Cipro Had DM for 2 years. Has lost 3 lbs. A1C 7%.  Wants to control and improve her Dm with lifestyle and not medications.  Anthropometrics  Wt Readings from Last 3 Encounters:  04/10/22 163 lb (73.9 kg)  02/27/22 166 lb (75.3 kg)  01/09/22 168 lb 6.4 oz (76.4 kg)   Ht Readings from Last 3 Encounters:  04/10/22 5' 1.5" (1.562 m)  02/27/22 '5\' 2"'$  (1.575 m)  01/09/22 '5\' 2"'$  (1.575 m)   Body mass index is 30.3 kg/m. '@BMIFA'$ @ Facility age limit for growth percentiles is 20 years. Facility age limit for growth percentiles is 20 years.    Clinical Medical Hx: See chart Medications: see chart NO DM medications currently Labs:  Lab Results  Component Value Date   HGBA1C 7.0 02/27/2022      Latest Ref Rng & Units 10/30/2021    4:27 PM 05/14/2021    3:38 PM 12/27/2020   11:53 AM  CMP  Glucose 70 - 99 mg/dL 93   93   133    BUN 8 - 27 mg/dL '10   11   9    '$ Creatinine 0.57 - 1.00 mg/dL 0.61   0.77   0.78    Sodium 134 - 144 mmol/L 140   137   139    Potassium 3.5 - 5.2 mmol/L 3.7   3.2   3.7    Chloride 96 - 106 mmol/L 97   98   99    CO2 20 - 29 mmol/L '23   24   24    '$ Calcium 8.7 - 10.3 mg/dL 10.5   10.0   10.0    Total Protein 6.0 - 8.5 g/dL  7.2     Total Bilirubin 0.0 - 1.2 mg/dL  0.4     Alkaline Phos 44 - 121 IU/L  99     AST 0 - 40 IU/L  16     ALT 0 - 32 IU/L  17      Lipid Panel     Component Value Date/Time   CHOL 209 (H) 10/30/2021 1627   TRIG 46 10/30/2021 1627   HDL 84 10/30/2021 1627   CHOLHDL 2.5  10/30/2021 1627   CHOLHDL 2.6 08/31/2020 0812   VLDL 8 07/09/2017 1052   LDLCALC 117 (H) 10/30/2021 1627   LDLCALC 113 (H) 08/31/2020 0812   LABVLDL 8 10/30/2021 1627    Notable Signs/Symptoms: None  Lifestyle & Dietary Hx Eats 2-3 meals per day. Use to walk a lot but due to knee issues, hasn't been able to. NO medications for DM.  Estimated daily fluid intake: 40 oz- drinks diet sodas Supplements: none Sleep: good Stress / self-care: no Current average weekly physical activity: ADL due to knee issues  24-Hr Dietary Recall Eats 2-3 meals per day  Estimated Energy Needs Calories: 1500 Carbohydrate: 170g Protein: 112g Fat: 42g   NUTRITION DIAGNOSIS  NB-1.1 Food and nutrition-related knowledge deficit As related to Diabetes Type 2.  As evidenced by A1C of 7%.  NUTRITION INTERVENTION  Nutrition education (E-1) on the following topics:  Nutrition and Diabetes education provided on My Plate, CHO counting, meal planning, portion sizes, timing of meals, avoiding snacks between meals unless having a low blood sugar, target ranges for A1C and blood sugars, signs/symptoms and treatment of hyper/hypoglycemia, monitoring blood sugars, taking medications as prescribed, benefits of exercising 30 minutes per day and prevention of complications of DM. Lifestyle Medicine  - Whole Food, Plant Predominant Nutrition is highly recommended: Eat Plenty of vegetables, Mushrooms, fruits, Legumes, Whole Grains, Nuts, seeds in lieu of processed meats, processed snacks/pastries red meat, poultry, eggs.    -It is better to avoid simple carbohydrates including: Cakes, Sweet Desserts, Ice Cream, Soda (diet and regular), Sweet Tea, Candies, Chips, Cookies, Store Bought Juices, Alcohol in Excess of  1-2 drinks a day, Lemonade,  Artificial Sweeteners, Doughnuts, Coffee Creamers, "Sugar-free" Products, etc, etc.  This is not a complete list.....  Exercise: If you are able: 30 -60 minutes a day ,4 days a week,  or 150 minutes a week.  The longer the better.  Combine stretch, strength, and aerobic activities.  If you were told in the past that you have high risk for cardiovascular diseases, you may seek evaluation by your heart doctor prior to initiating moderate to intense exercise programs.   Handouts Provided Include  Lifestyle medicine Meal Plan Card [facility]plateliving handout  Learning Style & Readiness for Change Teaching method utilized: Visual & Auditory  Demonstrated degree of understanding via: Teach Back  Barriers to learning/adherence to lifestyle change: none  Goals Established by Pt Goals  Eat three meals per day Prep meals and take to work Walking at lunch- goal is 30 minutes 5 times per week. Focus on plant based whole foods Go to [email or web address] Lose 1 lb per week Cut out diet sodas. You would benefit from a meter and testing supplies to monitor your blood sugars at home. Talk to your PCP for prescription.  MONITORING & EVALUATION Dietary intake, weekly physical activity, and blood sugars  in 1 month..  Next Steps  Patient is to work on meal planning and meal prepping.Marland Kitchen

## 2022-04-25 ENCOUNTER — Encounter: Payer: Self-pay | Admitting: Orthopedic Surgery

## 2022-04-25 ENCOUNTER — Ambulatory Visit: Payer: Commercial Managed Care - PPO | Admitting: Orthopedic Surgery

## 2022-04-25 DIAGNOSIS — M25461 Effusion, right knee: Secondary | ICD-10-CM | POA: Diagnosis not present

## 2022-04-25 NOTE — Progress Notes (Signed)
? ?Office Visit Note ?  ?Patient: Kathleen Kirby           ?Date of Birth: 1961/11/24           ?MRN: [id number] ?Visit Date: 04/25/2022 ?Requested by: Fayrene Helper, MD ?[address] ?[place],   [address] ?PCP: Fayrene Helper, MD ? ?Subjective: ?Chief Complaint  ?Patient presents with  ? Other  ?   ?Scan review  ? ? ?HPI: Mariann Laster is a 61 year old patient here to review MRI scan of her right knee.  Her symptoms overall have improved.  Still reports an effusion.  Aspiration and injection last clinic visit helped her for 2 weeks.  She is on Lasix and takes Tylenol.  MRI scan is reviewed and it does show a medial meniscal tear with meniscal flap but it is reflected into the posterior aspect of the knee and not in the joint.  The lesion seen on the plain radiographs appears to be a cystic structure possible AV malformation.  Nothing malignant. ?             ?ROS: All systems reviewed are negative as they relate to the chief complaint within the history of present illness.  Patient denies  fevers or chills. ? ? ?Assessment & Plan: ?Visit Diagnoses:  ?1. Effusion, right knee   ? ? ?Plan: Impression is right knee medial meniscal tear with current and continued effusion.  I think she is likely heading for arthroscopy and debridement of that meniscal flap sometime in the near future.  For now she is going to observe and see how she does.  Come back in 3 months primarily to recheck effusion and decision for or against arthroscopy and meniscal debridement at that time. ? ?Follow-Up Instructions: Return in about 3 months (around 07/26/2022).  ? ?Orders:  ?No orders of the defined types were placed in this encounter. ? ?No orders of the defined types were placed in this encounter. ? ? ? ? Procedures: ?No procedures performed ? ? ?Clinical Data: ?No additional findings. ? ?Objective: ?Vital Signs: LMP 11/27/2012  ? ?Physical Exam:  ? ?Constitutional: Patient appears well-developed ?HEENT:  ?Head:  Normocephalic ?Eyes:EOM are normal ?Neck: Normal range of motion ?Cardiovascular: Normal rate ?Pulmonary/chest: Effort normal ?Neurologic: Patient is alert ?Skin: Skin is warm ?Psychiatric: Patient has normal mood and affect ? ? ?Ortho Exam: Ortho exam demonstrates full active and passive range of motion of the right knee with mild effusion present.  Collateral and cruciate ligaments are stable.  Extensor mechanism intact.  Mild medial greater than lateral joint line tenderness.  No masses lymphadenopathy or skin changes noted in that right knee region.  Pedal pulses palpable.  Ankle dorsiflexion intact. ? ?Specialty Comments:  ?No specialty comments available. ? ?Imaging: ?No results found. ? ? ?PMFS History: ?Patient Active Problem List  ? Diagnosis Date Noted  ? Anterior knee pain, right 02/27/2022  ? Pain and swelling of right lower leg 01/09/2022  ? Pap smear abnormality of cervix/human papillomavirus (HPV) positive 05/10/2017  ? Type 2 diabetes mellitus with vascular disease ([facility]) 03/26/2016  ? Hyperlipidemia LDL goal <100 05/14/2013  ? Low back pain 09/19/2009  ? Obesity (BMI 30.0-34.9) 03/02/2008  ? Essential hypertension 03/02/2008  ? ?Past Medical History:  ?Diagnosis Date  ? Allergy   ? Anxiety about health 07/20/2017  ? Back pain   ? Bilateral shoulder pain   ? Diabetes mellitus without complication ([facility])   ? FH: CAD (coronary artery disease) 09/14/2014  ?  Sibling with MI under age 58   ? Hyperlipidemia   ? Hypertension   ? Metabolic syndrome X 6/78/9381  ? Mild dysplasia of cervix 12/21/2013  ? Obesity   ? Right hip pain   ?  ?Family History  ?Problem Relation Age of Onset  ? Heart disease Mother   ? Coronary artery disease Father   ? Heart disease Father   ? Lung cancer Brother   ? Brain cancer Brother   ? Diabetes Sister   ? Coronary artery disease Sister   ? Hypertension Sister   ?  ?History reviewed. No pertinent surgical history. ?Social History  ? ?Occupational History  ? 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 Smoking status: Never  ? Smokeless tobacco: Never  ?Vaping Use  ? Vaping Use: Never used  ?Substance and Sexual Activity  ? Alcohol use: No  ? Drug use: No  ? Sexual activity: Yes  ?  Partners: Male  ?  Birth control/protection: None  ? ? ? ? ? ?

## 2022-05-14 ENCOUNTER — Encounter: Payer: Self-pay | Admitting: Nutrition

## 2022-06-17 ENCOUNTER — Other Ambulatory Visit: Payer: Self-pay | Admitting: Internal Medicine

## 2022-06-17 DIAGNOSIS — I10 Essential (primary) hypertension: Secondary | ICD-10-CM

## 2022-06-26 ENCOUNTER — Encounter: Payer: Commercial Managed Care - PPO | Attending: Family Medicine | Admitting: Nutrition

## 2022-06-26 ENCOUNTER — Encounter: Payer: Self-pay | Admitting: Nutrition

## 2022-06-26 VITALS — Ht 61.5 in | Wt 155.0 lb

## 2022-06-26 DIAGNOSIS — Z713 Dietary counseling and surveillance: Secondary | ICD-10-CM | POA: Insufficient documentation

## 2022-06-26 DIAGNOSIS — E1159 Type 2 diabetes mellitus with other circulatory complications: Secondary | ICD-10-CM | POA: Insufficient documentation

## 2022-06-26 DIAGNOSIS — E785 Hyperlipidemia, unspecified: Secondary | ICD-10-CM | POA: Insufficient documentation

## 2022-06-26 DIAGNOSIS — I10 Essential (primary) hypertension: Secondary | ICD-10-CM | POA: Diagnosis present

## 2022-06-26 DIAGNOSIS — E669 Obesity, unspecified: Secondary | ICD-10-CM

## 2022-06-26 NOTE — Progress Notes (Signed)
Medical Nutrition Therapy  Appointment Start time:  6759  Appointment End time:  47  Primary concerns today: DM Ty[e 2. obesity  Referral diagnosis: E11.8, e66.9 Preferred learning style: No preference. Learning readiness: Changes in progress    NUTRITION ASSESSMENT  Has lost 8 lbs. Clothes are a lot looser. Has been making better food choices of more fruits, vegetables and whole grains. Has been avoiding fast foods. Sleeping better. Has more energy. Has been eating more salads and garden vegetables. Drinking a lot more water. Has cut sodas down to 1 per week.  She isn't testing her BS. Not on any medications for her DM. Is doing lifestyle medicine and feels so much better. Goes back to [place]  next week for her follow up visit and labs. Going to walk on the track 3-4 times per week with her sister.  Anthropometrics  Wt Readings from Last 3 Encounters:  04/10/22 163 lb (73.9 kg)  02/27/22 166 lb (75.3 kg)  01/09/22 168 lb 6.4 oz (76.4 kg)   Ht Readings from Last 3 Encounters:  04/10/22 5' 1.5" (1.562 m)  02/27/22 '5\' 2"'$  (1.575 m)  01/09/22 '5\' 2"'$  (1.575 m)   There is no height or weight on file to calculate BMI. '@BMIFA'$ @ Facility age limit for growth %iles is 20 years. Facility age limit for growth %iles is 20 years.    Clinical Medical Hx: See chart Medications: see chart NO DM medications currently Labs:  Lab Results  Component Value Date   HGBA1C 7.0 02/27/2022      Latest Ref Rng & Units 10/30/2021    4:27 PM 05/14/2021    3:38 PM 12/27/2020   11:53 AM  CMP  Glucose 70 - 99 mg/dL 93  93  133   BUN 8 - 27 mg/dL '10  11  9   '$ Creatinine 0.57 - 1.00 mg/dL 0.61  0.77  0.78   Sodium 134 - 144 mmol/L 140  137  139   Potassium 3.5 - 5.2 mmol/L 3.7  3.2  3.7   Chloride 96 - 106 mmol/L 97  98  99   CO2 20 - 29 mmol/L '23  24  24   '$ Calcium 8.7 - 10.3 mg/dL 10.5  10.0  10.0   Total Protein 6.0 - 8.5 g/dL  7.2    Total Bilirubin 0.0 - 1.2 mg/dL  0.4    Alkaline Phos 44 - 121  IU/L  99    AST 0 - 40 IU/L  16    ALT 0 - 32 IU/L  17     Lipid Panel     Component Value Date/Time   CHOL 209 (H) 10/30/2021 1627   TRIG 46 10/30/2021 1627   HDL 84 10/30/2021 1627   CHOLHDL 2.5 10/30/2021 1627   CHOLHDL 2.6 08/31/2020 0812   VLDL 8 07/09/2017 1052   LDLCALC 117 (H) 10/30/2021 1627   LDLCALC 113 (H) 08/31/2020 0812   LABVLDL 8 10/30/2021 1627    Notable Signs/Symptoms: None  Lifestyle & Dietary Hx Eats 2-3 meals per day. Use to walk a lot but due to knee issues, hasn't been able to. NO medications for DM.  Estimated daily fluid intake: 40 oz- drinks diet sodas Supplements: none Sleep: good Stress / self-care: no Current average weekly physical activity: ADL due to knee issues  24-Hr Dietary Recall Eats 2-3 meals per day  Estimated Energy Needs Calories: 1500 Carbohydrate: 170g Protein: 112g Fat: 42g   NUTRITION DIAGNOSIS  NB-1.1 Food and nutrition-related  knowledge deficit As related to Diabetes Type 2.  As evidenced by A1C of 7%.   NUTRITION INTERVENTION  Nutrition education (E-1) on the following topics:  Nutrition and Diabetes education provided on My Plate, CHO counting, meal planning, portion sizes, timing of meals, avoiding snacks between meals unless having a low blood sugar, target ranges for A1C and blood sugars, signs/symptoms and treatment of hyper/hypoglycemia, monitoring blood sugars, taking medications as prescribed, benefits of exercising 30 minutes per day and prevention of complications of DM. Lifestyle Medicine  - Whole Food, Plant Predominant Nutrition is highly recommended: Eat Plenty of vegetables, Mushrooms, fruits, Legumes, Whole Grains, Nuts, seeds in lieu of processed meats, processed snacks/pastries red meat, poultry, eggs.    -It is better to avoid simple carbohydrates including: Cakes, Sweet Desserts, Ice Cream, Soda (diet and regular), Sweet Tea, Candies, Chips, Cookies, Store Bought Juices, Alcohol in Excess of  1-2  drinks a day, Lemonade,  Artificial Sweeteners, Doughnuts, Coffee Creamers, "Sugar-free" Products, etc, etc.  This is not a complete list.....  Exercise: If you are able: 30 -60 minutes a day ,4 days a week, or 150 minutes a week.  The longer the better.  Combine stretch, strength, and aerobic activities.  If you were told in the past that you have high risk for cardiovascular diseases, you may seek evaluation by your heart doctor prior to initiating moderate to intense exercise programs.   Handouts Provided Include  Lifestyle medicine Meal Plan Card Fullplateliving handout  Learning Style & Readiness for Change Teaching method utilized: Visual & Auditory  Demonstrated degree of understanding via: Teach Back  Barriers to learning/adherence to lifestyle change: none  Goals Established by Pt Goals  Get rid of sodas's 100% Get A1C to 6.5% or less. Lose 10 lbs  in the next 3 week. Talk to MD about reducing some BP medicines if needed. Need to continue high fiber foods and get your LDL-bad cholesterol down to 70 or below. Keep up the great job!!  MONITORING & EVALUATION Dietary intake, weekly physical activity, and blood sugars  in 3 month.. May consider reducing BP medications as needed due to lifestyle changes and weight loss.  Next Steps  Patient is to work on meal planning and meal prepping.Marland Kitchen

## 2022-06-26 NOTE — Patient Instructions (Addendum)
Goals  Get rid of sodas's 100% Get A1C to 6.5% or less. Lose 10 lbs  in the next 3 week. Talk to MD about reducing some BP medicines if needed. Need to continue high fiber foods and get your LDL-bad cholesterol down to 70 or below. Keep up the great job!!

## 2022-07-03 ENCOUNTER — Other Ambulatory Visit: Payer: Self-pay | Admitting: Family Medicine

## 2022-07-04 ENCOUNTER — Encounter: Payer: Self-pay | Admitting: Family Medicine

## 2022-07-04 ENCOUNTER — Ambulatory Visit: Payer: Commercial Managed Care - PPO | Admitting: Family Medicine

## 2022-07-04 VITALS — BP 130/70 | HR 70 | Ht 61.5 in | Wt 152.1 lb

## 2022-07-04 DIAGNOSIS — I10 Essential (primary) hypertension: Secondary | ICD-10-CM | POA: Diagnosis not present

## 2022-07-04 DIAGNOSIS — E669 Obesity, unspecified: Secondary | ICD-10-CM | POA: Diagnosis not present

## 2022-07-04 DIAGNOSIS — E1159 Type 2 diabetes mellitus with other circulatory complications: Secondary | ICD-10-CM | POA: Diagnosis not present

## 2022-07-04 DIAGNOSIS — E785 Hyperlipidemia, unspecified: Secondary | ICD-10-CM

## 2022-07-04 DIAGNOSIS — E66811 Obesity, class 1: Secondary | ICD-10-CM

## 2022-07-04 DIAGNOSIS — Z1231 Encounter for screening mammogram for malignant neoplasm of breast: Secondary | ICD-10-CM | POA: Diagnosis not present

## 2022-07-04 MED ORDER — AMLODIPINE-OLMESARTAN 5-20 MG PO TABS: 1.0000 | ORAL_TABLET | Freq: Every day | ORAL | 3 refills | Status: DC

## 2022-07-04 MED ORDER — AMLODIPINE-OLMESARTAN 5-20 MG PO TABS
1.0000 | ORAL_TABLET | Freq: Every day | ORAL | 0 refills | Status: DC
Start: 2022-07-04 — End: 2022-11-05

## 2022-07-04 NOTE — Patient Instructions (Addendum)
Annual exam inend Novemebr, call if you need me sooner  Labs ordered in March today, lipid, cmp and EGFr and HBA1C  Congrats on weight loss, excellent blood pressure  Please schedule December mammogram at checkout  Think about what you will eat, plan ahead. Choose " clean, green, fresh or frozen" over canned, processed or packaged foods which are more sugary, salty and fatty. 70 to 75% of food eaten should be vegetables and fruit. Three meals at set times with snacks allowed between meals, but they must be fruit or vegetables. Aim to eat over a 12 hour period , example 7 am to 7 pm, and STOP after  your last meal of the day. Drink water,generally about 64 ounces per day, no other drink is as healthy. Fruit juice is best enjoyed in a healthy way, by EATING the fruit.

## 2022-07-05 ENCOUNTER — Encounter: Payer: Self-pay | Admitting: Family Medicine

## 2022-07-05 LAB — LIPID PANEL
Chol/HDL Ratio: 2.3 ratio (ref 0.0–4.4)
Cholesterol, Total: 199 mg/dL (ref 100–199)
HDL: 85 mg/dL (ref >39)
LDL Chol Calc (NIH): 104 mg/dL — ABNORMAL HIGH (ref 0–99)
Triglycerides: 53 mg/dL (ref 0–149)
VLDL Cholesterol Cal: 10 mg/dL (ref 5–40)

## 2022-07-05 LAB — CMP14+EGFR
ALT: 17 IU/L (ref 0–32)
AST: 21 IU/L (ref 0–40)
Albumin/Globulin Ratio: 1.9 (ref 1.2–2.2)
Albumin: 4.7 g/dL (ref 3.8–4.9)
Alkaline Phosphatase: 98 IU/L (ref 44–121)
BUN/Creatinine Ratio: 13 (ref 12–28)
BUN: 11 mg/dL (ref 8–27)
Bilirubin Total: 0.4 mg/dL (ref 0.0–1.2)
CO2: 28 mmol/L (ref 20–29)
Calcium: 10.6 mg/dL — ABNORMAL HIGH (ref 8.7–10.3)
Chloride: 100 mmol/L (ref 96–106)
Creatinine, Ser: 0.88 mg/dL (ref 0.57–1.00)
Globulin, Total: 2.5 g/dL (ref 1.5–4.5)
Glucose: 76 mg/dL (ref 70–99)
Potassium: 3.8 mmol/L (ref 3.5–5.2)
Sodium: 142 mmol/L (ref 134–144)
Total Protein: 7.2 g/dL (ref 6.0–8.5)
eGFR: 75 mL/min/{1.73_m2} (ref >59)

## 2022-07-05 LAB — HEMOGLOBIN A1C
Est. average glucose Bld gHb Est-mCnc: 140 mg/dL
Hgb A1c MFr Bld: 6.5 % — ABNORMAL HIGH (ref 4.8–5.6)

## 2022-07-05 NOTE — Assessment & Plan Note (Signed)
[name] is reminded of the importance of commitment to daily physical activity for 30 minutes or more, as able and the need to limit carbohydrate intake to 30 to 60 grams per meal to help with blood sugar control.   .   Kathleen Kirby is reminded of the importance of daily foot exam, annual eye examination, and good blood sugar, blood pressure and cholesterol control.     Latest Ref Rng & Units 07/04/2022    4:02 PM 02/27/2022    4:51 PM 10/30/2021    4:27 PM 05/14/2021    3:38 PM 12/27/2020   11:53 AM  Diabetic Labs  HbA1c 4.8 - 5.6 % 6.5  7.0  7.0  7.0    Chol 100 - 199 mg/dL 199   209  230    HDL >39 mg/dL 85   84  78    Calc LDL 0 - 99 mg/dL 104   117  141    Triglycerides 0 - 149 mg/dL 53   46  63    Creatinine 0.57 - 1.00 mg/dL 0.88   0.61  0.77  0.78       07/04/2022    3:43 PM 07/04/2022    2:46 PM 06/26/2022    2:34 PM 04/10/2022    3:30 PM 02/27/2022    3:12 PM 01/09/2022    4:07 PM 12/26/2021    1:51 PM  BP/Weight  Systolic BP 932 355   732 202 542  Diastolic BP 70 70   67 78 67  Wt. (Lbs)  152.12 155 163 166 168.4 173  BMI  28.28 kg/m2 28.81 kg/m2 30.3 kg/m2 30.36 kg/m2 30.8 kg/m2 32.16 kg/m2      Latest Ref Rng & Units 10/30/2021    3:00 PM 11/01/2020   12:00 AM  Foot/eye exam completion dates  Eye Exam No Retinopathy  No Retinopathy      Foot Form Completion  Done      This result is from an external source.

## 2022-07-05 NOTE — Assessment & Plan Note (Signed)
Improved  Patient re-educated about  the importance of commitment to a  minimum of 150 minutes of exercise per week as able.  The importance of healthy food choices with portion control discussed, as well as eating regularly and within a 12 hour window most days. The need to choose "clean , green" food 50 to 75% of the time is discussed, as well as to make water the primary drink and set a goal of 64 ounces water daily.       07/04/2022    2:46 PM 06/26/2022    2:34 PM 04/10/2022    3:30 PM  Weight /BMI  Weight 152 lb 1.9 oz 155 lb 163 lb  Height 5' 1.5" (1.562 m) 5' 1.5" (1.562 m) 5' 1.5" (1.562 m)  BMI 28.28 kg/m2 28.81 kg/m2 30.3 kg/m2

## 2022-07-05 NOTE — Progress Notes (Signed)
Kathleen Kirby     MRN: [id number]      DOB: Apr 19, 1961   HPI Kathleen Kirby is here for follow up and re-evaluation of chronic medical conditions, medication management and review of any available recent lab and radiology data.  Preventive health is updated, specifically  Cancer screening and Immunization.   Questions or concerns regarding consultations or procedures which the PT has had in the interim are  addressed. The PT denies any adverse reactions to current medications since the last visit.  There are no new concerns.  There are no specific complaints  Continues to work hard on lifestyle change to  control blood sugar and improve cholesterol  ROS Denies recent fever or chills. Denies sinus pressure, nasal congestion, ear pain or sore throat. Denies chest congestion, productive cough or wheezing. Denies chest pains, palpitations and leg swelling Denies abdominal pain, nausea, vomiting,diarrhea or constipation.   Denies dysuria, frequency, hesitancy or incontinence. Chronic  knee pain Denies headaches, seizures, numbness, or tingling. Denies depression, anxiety or insomnia. Denies skin break down or rash.   PE  BP 130/70   Pulse 70   Ht 5' 1.5" (1.562 m)   Wt 152 lb 1.9 oz (69 kg)   LMP 09/29/2012   SpO2 98%   BMI 28.28 kg/m   Patient alert and oriented and in no cardiopulmonary distress.  HEENT: No facial asymmetry, EOMI,     Neck supple .  Chest: Clear to auscultation bilaterally.  CVS: S1, S2 no murmurs, no S3.Regular rate.  ABD: Soft non tender.   Ext: No edema  MS: Adequate ROM spine, shoulders, hips and knees.  Skin: Intact, no ulcerations or rash noted.  Psych: Good eye contact, normal affect. Memory intact not anxious or depressed appearing.  CNS: CN 2-12 intact, power,  normal throughout.no focal deficits noted.   Assessment & Plan  Essential hypertension Controlled, no change in medication DASH diet and commitment to daily physical activity  for a minimum of 30 minutes discussed and encouraged, as a part of hypertension management. The importance of attaining a healthy weight is also discussed.     07/04/2022    3:43 PM 07/04/2022    2:46 PM 06/26/2022    2:34 PM 04/10/2022    3:30 PM 02/27/2022    3:12 PM 01/09/2022    4:07 PM 12/26/2021    1:51 PM  BP/Weight  Systolic BP 269 485   462 703 500  Diastolic BP 70 70   67 78 67  Wt. (Lbs)  152.12 155 163 166 168.4 173  BMI  28.28 kg/m2 28.81 kg/m2 30.3 kg/m2 30.36 kg/m2 30.8 kg/m2 32.16 kg/m2       Obesity (BMI 30.0-34.9) Improved  Patient re-educated about  the importance of commitment to a  minimum of 150 minutes of exercise per week as able.  The importance of healthy food choices with portion control discussed, as well as eating regularly and within a 12 hour window most days. The need to choose "clean , green" food 50 to 75% of the time is discussed, as well as to make water the primary drink and set a goal of 64 ounces water daily.       07/04/2022    2:46 PM 06/26/2022    2:34 PM 04/10/2022    3:30 PM  Weight /BMI  Weight 152 lb 1.9 oz 155 lb 163 lb  Height 5' 1.5" (1.562 m) 5' 1.5" (1.562 m) 5' 1.5" (1.562 m)  BMI 28.28 kg/m2  28.81 kg/m2 30.3 kg/m2      Type 2 diabetes mellitus with vascular disease ([facility]) [name] is reminded of the importance of commitment to daily physical activity for 30 minutes or more, as able and the need to limit carbohydrate intake to 30 to 60 grams per meal to help with blood sugar control.   .   Kathleen Kirby is reminded of the importance of daily foot exam, annual eye examination, and good blood sugar, blood pressure and cholesterol control.     Latest Ref Rng & Units 07/04/2022    4:02 PM 02/27/2022    4:51 PM 10/30/2021    4:27 PM 05/14/2021    3:38 PM 12/27/2020   11:53 AM  Diabetic Labs  HbA1c 4.8 - 5.6 % 6.5  7.0  7.0  7.0    Chol 100 - 199 mg/dL 199   209  230    HDL >39 mg/dL 85   84  78    Calc LDL 0 - 99  mg/dL 104   117  141    Triglycerides 0 - 149 mg/dL 53   46  63    Creatinine 0.57 - 1.00 mg/dL 0.88   0.61  0.77  0.78       07/04/2022    3:43 PM 07/04/2022    2:46 PM 06/26/2022    2:34 PM 04/10/2022    3:30 PM 02/27/2022    3:12 PM 01/09/2022    4:07 PM 12/26/2021    1:51 PM  BP/Weight  Systolic BP 245 809   983 382 505  Diastolic BP 70 70   67 78 67  Wt. (Lbs)  152.12 155 163 166 168.4 173  BMI  28.28 kg/m2 28.81 kg/m2 30.3 kg/m2 30.36 kg/m2 30.8 kg/m2 32.16 kg/m2      Latest Ref Rng & Units 10/30/2021    3:00 PM 11/01/2020   12:00 AM  Foot/eye exam completion dates  Eye Exam No Retinopathy  No Retinopathy      Foot Form Completion  Done      This result is from an external source.        Hyperlipidemia LDL goal <100 Hyperlipidemia:Low fat diet discussed and encouraged.   Lipid Panel  Lab Results  Component Value Date   CHOL 199 07/04/2022   HDL 85 07/04/2022   LDLCALC 104 (H) 07/04/2022   TRIG 53 07/04/2022   CHOLHDL 2.3 07/04/2022   improved

## 2022-07-05 NOTE — Assessment & Plan Note (Signed)
Controlled, no change in medication DASH diet and commitment to daily physical activity for a minimum of 30 minutes discussed and encouraged, as a part of hypertension management. The importance of attaining a healthy weight is also discussed.     07/04/2022    3:43 PM 07/04/2022    2:46 PM 06/26/2022    2:34 PM 04/10/2022    3:30 PM 02/27/2022    3:12 PM 01/09/2022    4:07 PM 12/26/2021    1:51 PM  BP/Weight  Systolic BP 532 992   426 834 196  Diastolic BP 70 70   67 78 67  Wt. (Lbs)  152.12 155 163 166 168.4 173  BMI  28.28 kg/m2 28.81 kg/m2 30.3 kg/m2 30.36 kg/m2 30.8 kg/m2 32.16 kg/m2

## 2022-07-05 NOTE — Assessment & Plan Note (Signed)
Hyperlipidemia:Low fat diet discussed and encouraged.   Lipid Panel  Lab Results  Component Value Date   CHOL 199 07/04/2022   HDL 85 07/04/2022   LDLCALC 104 (H) 07/04/2022   TRIG 53 07/04/2022   CHOLHDL 2.3 07/04/2022   improved

## 2022-07-08 ENCOUNTER — Other Ambulatory Visit: Payer: Self-pay

## 2022-07-08 DIAGNOSIS — E559 Vitamin D deficiency, unspecified: Secondary | ICD-10-CM

## 2022-07-08 DIAGNOSIS — Z1329 Encounter for screening for other suspected endocrine disorder: Secondary | ICD-10-CM

## 2022-07-08 DIAGNOSIS — E1159 Type 2 diabetes mellitus with other circulatory complications: Secondary | ICD-10-CM

## 2022-07-08 DIAGNOSIS — I10 Essential (primary) hypertension: Secondary | ICD-10-CM

## 2022-07-08 DIAGNOSIS — E785 Hyperlipidemia, unspecified: Secondary | ICD-10-CM

## 2022-07-30 ENCOUNTER — Other Ambulatory Visit: Payer: Self-pay | Admitting: Family Medicine

## 2022-08-01 ENCOUNTER — Ambulatory Visit: Payer: Commercial Managed Care - PPO | Admitting: Orthopedic Surgery

## 2022-08-01 DIAGNOSIS — M25461 Effusion, right knee: Secondary | ICD-10-CM | POA: Diagnosis not present

## 2022-08-02 ENCOUNTER — Encounter: Payer: Self-pay | Admitting: Orthopedic Surgery

## 2022-08-02 NOTE — Progress Notes (Signed)
Office Visit Note   Patient: Kathleen Kirby           Date of Birth: 23-Oct-1961           MRN: [id number] Visit Date: 08/01/2022 Requested by: Fayrene Helper, MD [address] [place],  [place] [address] PCP: Fayrene Helper, MD  Subjective: Chief Complaint  Patient presents with   Right Knee - Follow-up    HPI: Kathleen Kirby is a 61 year old patient with right knee medial meniscal tear.  She has an MRI scan which does show medial meniscal tear with large fragment flipped into the notch region.  She does have about 50% of the meniscal volume left.  Reports some stiffness and pain which occasionally wakes her from sleep at night.  This is an aching type pain on the medial aspect of the knee.  Does swell at times.  Aspiration and injection helped her knee.  Going downstairs is difficult.  She works in a [employer] for 12-hour shifts 6 3 days a week.              ROS: All systems reviewed are negative as they relate to the chief complaint within the history of present illness.  Patient denies  fevers or chills.   Assessment & Plan: Visit Diagnoses:  1. Effusion, right knee     Plan: Impression is right knee medial meniscal tear with continued pain and arthritic symptoms.  Not too much degeneration noted in the medial compartment at this time.  Plan is that we talked a lot about operative and nonoperative treatment options.  In general nonoperative treatment would be observation with leg strengthening.  Operative treatment would be arthroscopy and debridement.  I do think that arthroscopy and debridement could give her some noticeable relief but it will not be a normal knee by any stretch.  The meniscus is not repairable and at the end of the day she still has diminished meniscal volume to cushion weightbearing in the knee.  She is going to consider her options for arthroscopic debridement and let me know if she wants to proceed.  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  She would need a rigorous rehabilitative regimen focusing on quad strengthening to get back to the point she needs to be to return to work.  Follow-Up Instructions: Return if symptoms worsen or fail to improve.   Orders:  No orders of the defined types were placed in this encounter.  No orders of the defined types were placed in this encounter.     Procedures: No procedures performed   Clinical Data: No additional findings.  Objective: Vital Signs: LMP 09/29/2012   Physical Exam:   Constitutional: Patient appears well-developed HEENT:  Head: Normocephalic Eyes:EOM are normal Neck: Normal range of motion Cardiovascular: Normal rate Pulmonary/chest: Effort normal Neurologic: Patient is alert Skin: Skin is warm Psychiatric: Patient has normal mood and affect   Ortho Exam: Ortho exam demonstrates full active and passive range of motion of that right knee.  Trace effusion is present.  Medial joint line tenderness is present.  Collateral crucial ligaments are stable.  No masses lymphadenopathy or skin changes noted in that right knee region.  [facility] compression testing is positive for medial compartment pathology.  Specialty Comments:  No specialty comments available.  Imaging: No results found.   PMFS History: Patient Active Problem List   Diagnosis Date Noted  Anterior knee pain, right 02/27/2022   Pain and swelling of right lower leg 01/09/2022   Pap smear abnormality of cervix/human papillomavirus (HPV) positive 05/10/2017   Type 2 diabetes mellitus with vascular disease ([place]) 03/26/2016   Hyperlipidemia LDL goal <100 05/14/2013   Low back pain 09/19/2009   Obesity (BMI 30.0-34.9) 03/02/2008   Essential hypertension 03/02/2008   Past Medical History:  Diagnosis Date   Allergy    Anxiety about health 07/20/2017   Back pain    Bilateral shoulder pain    Diabetes mellitus without complication  ([facility])    FH: CAD (coronary artery disease) 09/14/2014   Sibling with MI under age 58    Hyperlipidemia    Hypertension    Metabolic syndrome X 01/15/70   Mild dysplasia of cervix 12/21/2013   Obesity    Right hip pain     Family History  Problem Relation Age of Onset   Heart disease Mother    Coronary artery disease Father    Heart disease Father    Lung cancer Brother    Brain cancer Brother    Diabetes Sister    Coronary artery disease Sister    Hypertension Sister     History reviewed. No pertinent surgical history. 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

## 2022-08-07 ENCOUNTER — Telehealth: Payer: Self-pay | Admitting: Orthopedic Surgery

## 2022-08-07 NOTE — Telephone Encounter (Signed)
Done thx

## 2022-08-07 NOTE — Telephone Encounter (Signed)
Patient would like to schedule right knee surgery and is considering mid October.  If surgery is in order please provide surgery sheet.

## 2022-08-29 ENCOUNTER — Telehealth: Payer: Self-pay | Admitting: Orthopedic Surgery

## 2022-08-29 NOTE — Telephone Encounter (Signed)
Received medical records release form,$25.00 cash and FMLA paperwork/Forwarding to [facility] today

## 2022-10-02 ENCOUNTER — Ambulatory Visit: Payer: Commercial Managed Care - PPO | Admitting: Nutrition

## 2022-10-06 ENCOUNTER — Other Ambulatory Visit: Payer: Self-pay | Admitting: Orthopedic Surgery

## 2022-10-06 ENCOUNTER — Encounter: Payer: Self-pay | Admitting: Orthopedic Surgery

## 2022-10-06 DIAGNOSIS — S83231D Complex tear of medial meniscus, current injury, right knee, subsequent encounter: Secondary | ICD-10-CM | POA: Diagnosis not present

## 2022-10-06 MED ORDER — HYDROCODONE-ACETAMINOPHEN 5-325 MG PO TABS: 1.0000 | ORAL_TABLET | Freq: Four times a day (QID) | ORAL | 0 refills | Status: DC | PRN

## 2022-10-09 ENCOUNTER — Telehealth: Payer: Self-pay | Admitting: Orthopedic Surgery

## 2022-10-09 NOTE — Telephone Encounter (Signed)
[place] forms received. To [employer]. ?

## 2022-10-13 ENCOUNTER — Ambulatory Visit (INDEPENDENT_AMBULATORY_CARE_PROVIDER_SITE_OTHER): Payer: Commercial Managed Care - PPO | Admitting: Orthopedic Surgery

## 2022-10-13 DIAGNOSIS — M25461 Effusion, right knee: Secondary | ICD-10-CM

## 2022-10-14 ENCOUNTER — Telehealth: Payer: Self-pay | Admitting: Orthopedic Surgery

## 2022-10-15 ENCOUNTER — Encounter: Payer: Self-pay | Admitting: Orthopedic Surgery

## 2022-10-15 NOTE — Progress Notes (Signed)
   Post-Op Visit Note   Patient: Kathleen Kirby           Date of Birth: [birth year]           MRN: [id number] Visit Date: 10/13/2022 PCP: Fayrene Helper, MD   Assessment & Plan:  Chief Complaint:  Chief Complaint  Patient presents with   Right Knee - Routine Post Op    10/06/22 right knee scope   Visit Diagnoses:  1. Effusion, right knee     Plan: Kathleen Kirby is a 61 year old patient who underwent right knee arthroscopy with partial medial meniscectomy a week ago.  On exam the incisions intact.  Portal sutures removed.  Trace effusion in the knee. Quad strength with range of motion easily past 90.  Plan at this time his quad strengthening exercises with leg extensions.  Follow-up in 4 weeks for clinical recheck and release.  Follow-Up Instructions: No follow-ups on file.   Orders:  No orders of the defined types were placed in this encounter.  No orders of the defined types were placed in this encounter.   Imaging: No results found.  PMFS History: Patient Active Problem List   Diagnosis Date Noted   Anterior knee pain, right 02/27/2022   Pain and swelling of right lower leg 01/09/2022   Pap smear abnormality of cervix/human papillomavirus (HPV) positive 05/10/2017   Type 2 diabetes mellitus with vascular disease ([place]) 03/26/2016   Hyperlipidemia LDL goal <100 05/14/2013   Low back pain 09/19/2009   Obesity (BMI 30.0-34.9) 03/02/2008   Essential hypertension 03/02/2008   Past Medical History:  Diagnosis Date   Allergy    Anxiety about health 07/20/2017   Back pain    Bilateral shoulder pain    Diabetes mellitus without complication (HCC)    FH: CAD (coronary artery disease) 09/14/2014   Sibling with MI under age 13    Hyperlipidemia    Hypertension    Metabolic syndrome X 01/26/9416   Mild dysplasia of cervix 12/21/2013   Obesity    Right hip pain     Family History  Problem Relation Age of Onset   Heart disease Mother    Coronary artery disease Father     Heart disease Father    Lung cancer Brother    Brain cancer Brother    Diabetes Sister    Coronary artery disease Sister    Hypertension Sister     No past surgical history on file. 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

## 2022-10-16 NOTE — Telephone Encounter (Signed)
Thanks for the update

## 2022-10-16 NOTE — Telephone Encounter (Signed)
FYI-   Patient called stating that she had a reaction to the Hydrocodone Tuesday night up until Wednesday morning.  Stated that she had some itching, redness, and some swelling in her face and neck.  Patient has stopped taking Hydrocodone.  Did advise patient to try some benadryl.  Patient voiced that she would and advised to call back with any other concerns.

## 2022-10-17 ENCOUNTER — Telehealth: Payer: Self-pay | Admitting: Orthopedic Surgery

## 2022-10-17 NOTE — Telephone Encounter (Signed)
Pt called stating she need a new medication. Pt states she called and informed the dr she had an allergic reaction to the meds called in by Dr Marlou Sa but was not given anything to replace medication for her pain. Please send meds to pharmacy on file. Pt phone number is [phone].

## 2022-10-20 NOTE — Telephone Encounter (Signed)
Please try tramadol 1 p.o. every 8 hours as needed pain #30 with no refills.  She is allergic to codeine so I do not think she can go with oxycodone but if she feels like that would work we can try that to but I think tramadol makes more sense.

## 2022-10-21 ENCOUNTER — Other Ambulatory Visit: Payer: Self-pay | Admitting: Surgical

## 2022-10-21 MED ORDER — TRAMADOL HCL 50 MG PO TABS: 50.0000 mg | ORAL_TABLET | Freq: Three times a day (TID) | ORAL | 0 refills | Status: DC | PRN

## 2022-10-21 NOTE — Telephone Encounter (Signed)
Sent in per request

## 2022-10-21 NOTE — Telephone Encounter (Signed)
I spoke with patient. She states that the pain has not been horrible, but still is sharp sometimes at night. I explained about the Tramadol being sent in. She asked if you could change it and [facility] [place] on [address]. She does not know why [facility] is in her chart, she does not use that pharmacy. [facility]?  Patient is aware you are in surgery and she will have to wait.

## 2022-10-22 ENCOUNTER — Other Ambulatory Visit: Payer: Self-pay | Admitting: Surgical

## 2022-10-22 MED ORDER — TRAMADOL HCL 50 MG PO TABS: 50.0000 mg | ORAL_TABLET | Freq: Three times a day (TID) | ORAL | 0 refills | Status: AC | PRN

## 2022-10-22 NOTE — Telephone Encounter (Signed)
Left voicemail for patient advising.

## 2022-10-22 NOTE — Telephone Encounter (Signed)
Sent [facility] [address]

## 2022-11-05 ENCOUNTER — Encounter: Payer: Self-pay | Admitting: Family Medicine

## 2022-11-05 ENCOUNTER — Ambulatory Visit (INDEPENDENT_AMBULATORY_CARE_PROVIDER_SITE_OTHER): Payer: Commercial Managed Care - PPO | Admitting: Family Medicine

## 2022-11-05 VITALS — BP 115/70 | HR 76 | Ht 61.0 in | Wt 154.0 lb

## 2022-11-05 DIAGNOSIS — I10 Essential (primary) hypertension: Secondary | ICD-10-CM

## 2022-11-05 DIAGNOSIS — Z0000 Encounter for general adult medical examination without abnormal findings: Secondary | ICD-10-CM | POA: Diagnosis not present

## 2022-11-05 DIAGNOSIS — E119 Type 2 diabetes mellitus without complications: Secondary | ICD-10-CM | POA: Diagnosis not present

## 2022-11-05 DIAGNOSIS — Z23 Encounter for immunization: Secondary | ICD-10-CM | POA: Diagnosis not present

## 2022-11-05 DIAGNOSIS — E785 Hyperlipidemia, unspecified: Secondary | ICD-10-CM | POA: Diagnosis not present

## 2022-11-05 DIAGNOSIS — E1159 Type 2 diabetes mellitus with other circulatory complications: Secondary | ICD-10-CM

## 2022-11-05 NOTE — Patient Instructions (Addendum)
Follow-up in 5 months call if you need me sooner.  Flu vaccine in office today.   Foot exam. Is good  Please get labs previously ordered today results will be sent via my Chart.  Current COVID-vaccine is due please get this at your pharmacy as soon as possible.  Thankful that your right knee surgery has gone well.  Continue to work on healthy lifestyle involving regular exercise and healthy plant based food choice  Fasting lipid, cmp and EGFr and hBA1C 5 days before next visit  Thanks for choosing [facility], we consider it a privelige to serve you. Best for the Season and 2024! 

## 2022-11-07 ENCOUNTER — Encounter: Payer: Commercial Managed Care - PPO | Admitting: Family Medicine

## 2022-11-07 ENCOUNTER — Encounter: Payer: Self-pay | Admitting: Family Medicine

## 2022-11-07 DIAGNOSIS — Z0000 Encounter for general adult medical examination without abnormal findings: Secondary | ICD-10-CM | POA: Insufficient documentation

## 2022-11-07 HISTORY — DX: Encounter for general adult medical examination without abnormal findings: Z00.00

## 2022-11-07 NOTE — Assessment & Plan Note (Signed)
Ms. Bredeson is reminded of the importance of commitment to daily physical activity for 30 minutes or more, as able and the need to limit carbohydrate intake to 30 to 60 grams per meal to help with blood sugar control.     Ms. Bader is reminded of the importance of daily foot exam, annual eye examination, and good blood sugar, blood pressure and cholesterol control.     Latest Ref Rng & Units 11/05/2022    4:44 PM 07/04/2022    4:02 PM 02/27/2022    4:51 PM 10/30/2021    4:27 PM 05/14/2021    3:38 PM  Diabetic Labs  HbA1c 4.8 - 5.6 % 6.6  6.5  7.0  7.0  7.0   Micro/Creat Ratio  WILL FOLLOW  P      Chol 100 - 199 mg/dL 211  199   209  230   HDL >39 mg/dL 77  85   84  78   Calc LDL 0 - 99 mg/dL 120  104   117  141   Triglycerides 0 - 149 mg/dL 77  53   46  63   Creatinine 0.57 - 1.00 mg/dL 0.95  0.88   0.61  0.77     P Preliminary result      11/05/2022    3:53 PM 07/04/2022    3:43 PM 07/04/2022    2:46 PM 06/26/2022    2:34 PM 04/10/2022    3:30 PM 02/27/2022    3:12 PM 01/09/2022    4:07 PM  BP/Weight  Systolic BP 370 488 891   694 503  Diastolic BP 70 70 70   67 78  Wt. (Lbs) 154.04  152.12 155 163 166 168.4  BMI 29.11 kg/m2  28.28 kg/m2 28.81 kg/m2 30.3 kg/m2 30.36 kg/m2 30.8 kg/m2      11/05/2022    4:00 PM 10/30/2021    3:00 PM  Foot/eye exam completion dates  Foot Form Completion Done Done

## 2022-11-07 NOTE — Progress Notes (Signed)
Kathleen Kirby     MRN: [id number]      DOB: [birth year]  HPI: Patient is in for annual physical exam. No other health concerns are expressed or addressed at the visit. Recent labs,  are reviewed. Immunization is reviewed , and  updated if needed.   PE: BP 115/70 (BP Location: Right Arm, Patient Position: Sitting, Cuff Size: Large)   Pulse 76   Ht '5\' 1"'$  (1.549 m)   Wt 154 lb 0.6 oz (69.9 kg)   LMP 09/29/2012   SpO2 98%   BMI 29.11 kg/m   Pleasant  female, alert and oriented x 3, in no cardio-pulmonary distress. Afebrile. HEENT No facial trauma or asymetry. Sinuses non tender.  Extra occullar muscles intact.. External ears normal, . Neck: supple, no adenopathy,JVD or thyromegaly.No bruits.  Chest: Clear to ascultation bilaterally.No crackles or wheezes. Non tender to palpation  Cardiovascular system; Heart sounds normal,  S1 and  S2 ,no S3.  No murmur, or thrill. Apical beat not displaced Peripheral pulses normal.  Abdomen: Soft, non tender, no organomegaly or masses. No bruits. Bowel sounds normal. No guarding, tenderness or rebound.    Musculoskeletal exam: Full ROM of spine, hips , shoulders and knees. No deformity ,swelling or crepitus noted. No muscle wasting or atrophy.   Neurologic: Cranial nerves 2 to 12 intact. Power, tone ,sensation and reflexes normal throughout. No disturbance in gait. No tremor.  Skin: Intact, no ulceration, erythema , scaling or rash noted. Pigmentation normal throughout  Psych; Normal mood and affect. Judgement and concentration normal   Assessment & Plan:  Annual visit for general adult medical examination without abnormal findings Annual exam as documented. Counseling done  re healthy lifestyle involving commitment to 150 minutes exercise per week, heart healthy diet, and attaining healthy weight.The importance of adequate sleep also discussed. Regular seat belt use and home safety, is also discussed. Changes in  health habits are decided on by the patient with goals and time frames  set for achieving them. Immunization and cancer screening needs are specifically addressed at this visit.   Type 2 diabetes mellitus with vascular disease ([place]) [name] is reminded of the importance of commitment to daily physical activity for 30 minutes or more, as able and the need to limit carbohydrate intake to 30 to 60 grams per meal to help with blood sugar control.     [name] is reminded of the importance of daily foot exam, annual eye examination, and good blood sugar, blood pressure and cholesterol control.     Latest Ref Rng & Units 11/05/2022    4:44 PM 07/04/2022    4:02 PM 02/27/2022    4:51 PM 10/30/2021    4:27 PM 05/14/2021    3:38 PM  Diabetic Labs  HbA1c 4.8 - 5.6 % 6.6  6.5  7.0  7.0  7.0   Micro/Creat Ratio  WILL FOLLOW  P      Chol 100 - 199 mg/dL 211  199   209  230   HDL >39 mg/dL 77  85   84  78   Calc LDL 0 - 99 mg/dL 120  104   117  141   Triglycerides 0 - 149 mg/dL 77  53   46  63   Creatinine 0.57 - 1.00 mg/dL 0.95  0.88   0.61  0.77     P Preliminary result      11/05/2022    3:53 PM 07/04/2022    3:43  PM 07/04/2022    2:46 PM 06/26/2022    2:34 PM 04/10/2022    3:30 PM 02/27/2022    3:12 PM 01/09/2022    4:07 PM  BP/Weight  Systolic BP 099 833 825   053 976  Diastolic BP 70 70 70   67 78  Wt. (Lbs) 154.04  152.12 155 163 166 168.4  BMI 29.11 kg/m2  28.28 kg/m2 28.81 kg/m2 30.3 kg/m2 30.36 kg/m2 30.8 kg/m2      11/05/2022    4:00 PM 10/30/2021    3:00 PM  Foot/eye exam completion dates  Foot Form Completion Done Done

## 2022-11-07 NOTE — Assessment & Plan Note (Signed)

## 2022-11-08 LAB — CBC
Hematocrit: 36.6 % (ref 34.0–46.6)
Hemoglobin: 12.1 g/dL (ref 11.1–15.9)
MCH: 26.2 pg — ABNORMAL LOW (ref 26.6–33.0)
MCHC: 33.1 g/dL (ref 31.5–35.7)
MCV: 79 fL (ref 79–97)
Platelets: 305 10*3/uL (ref 150–450)
RBC: 4.62 x10E6/uL (ref 3.77–5.28)
RDW: 13 % (ref 11.7–15.4)
WBC: 7.8 10*3/uL (ref 3.4–10.8)

## 2022-11-08 LAB — TSH: TSH: 1.22 u[IU]/mL (ref 0.450–4.500)

## 2022-11-08 LAB — CMP14+EGFR
ALT: 13 IU/L (ref 0–32)
AST: 18 IU/L (ref 0–40)
Albumin/Globulin Ratio: 1.8 (ref 1.2–2.2)
Albumin: 4.7 g/dL (ref 3.9–4.9)
Alkaline Phosphatase: 97 IU/L (ref 44–121)
BUN/Creatinine Ratio: 12 (ref 12–28)
BUN: 11 mg/dL (ref 8–27)
Bilirubin Total: 0.2 mg/dL (ref 0.0–1.2)
CO2: 26 mmol/L (ref 20–29)
Calcium: 10.6 mg/dL — ABNORMAL HIGH (ref 8.7–10.3)
Chloride: 103 mmol/L (ref 96–106)
Creatinine, Ser: 0.95 mg/dL (ref 0.57–1.00)
Globulin, Total: 2.6 g/dL (ref 1.5–4.5)
Glucose: 72 mg/dL (ref 70–99)
Potassium: 4.5 mmol/L (ref 3.5–5.2)
Sodium: 142 mmol/L (ref 134–144)
Total Protein: 7.3 g/dL (ref 6.0–8.5)
eGFR: 68 mL/min/{1.73_m2} (ref >59)

## 2022-11-08 LAB — HEMOGLOBIN A1C
Est. average glucose Bld gHb Est-mCnc: 143 mg/dL
Hgb A1c MFr Bld: 6.6 % — ABNORMAL HIGH (ref 4.8–5.6)

## 2022-11-08 LAB — MICROALBUMIN / CREATININE URINE RATIO
Creatinine, Urine: 34.6 mg/dL
Microalb/Creat Ratio: 9 mg/g creat (ref 0–29)
Microalbumin, Urine: 3.2 ug/mL

## 2022-11-08 LAB — LIPID PANEL
Chol/HDL Ratio: 2.7 ratio (ref 0.0–4.4)
Cholesterol, Total: 211 mg/dL — ABNORMAL HIGH (ref 100–199)
HDL: 77 mg/dL (ref >39)
LDL Chol Calc (NIH): 120 mg/dL — ABNORMAL HIGH (ref 0–99)
Triglycerides: 77 mg/dL (ref 0–149)
VLDL Cholesterol Cal: 14 mg/dL (ref 5–40)

## 2022-11-08 LAB — VITAMIN D 25 HYDROXY (VIT D DEFICIENCY, FRACTURES): Vit D, 25-Hydroxy: 44.6 ng/mL (ref 30.0–100.0)

## 2022-11-10 ENCOUNTER — Ambulatory Visit: Payer: Commercial Managed Care - PPO | Admitting: Surgical

## 2022-11-10 ENCOUNTER — Other Ambulatory Visit: Payer: Self-pay

## 2022-11-10 ENCOUNTER — Encounter: Payer: Self-pay | Admitting: Orthopedic Surgery

## 2022-11-10 VITALS — Ht 61.0 in | Wt 154.0 lb

## 2022-11-10 DIAGNOSIS — E1159 Type 2 diabetes mellitus with other circulatory complications: Secondary | ICD-10-CM

## 2022-11-10 DIAGNOSIS — Z9889 Other specified postprocedural states: Secondary | ICD-10-CM

## 2022-11-10 NOTE — Progress Notes (Signed)
Post-Op Visit Note   Patient: Kathleen Kirby           Date of Birth: September 24, 1961           MRN: [id number] Visit Date: 11/10/2022 PCP: Fayrene Helper, MD   Assessment & Plan:  Chief Complaint:  Chief Complaint  Patient presents with   Right Knee - Routine Post Op    10/06/2022 right knee scope   Visit Diagnoses:  1. Status post arthroscopy of right knee     Plan: Patient is a 61 year old female who presents s/p right knee arthroscopy on 10/06/2022.  Had medial meniscal debridement.  Doing well from that and has only really had to take occasional Tylenol for pain control.  Denies any fevers, chills, calf pain, chest pain, shortness of breath.  She is ambulating without cane or walker.  She denies any knee instability or mechanical symptoms.  On exam, patient has 0 degrees extension to 125 degrees knee flexion.  No calf tenderness.  Negative Homans' sign.  Excellent quad strength rated 5/5 intact ankle dorsiflexion and plantarflexion.  Incisions are well-healed from prior arthroscopy.  Plan is to continue with quadricep strengthening exercises and stationary bike.  Recommended against using treadmill consistently.  She has excellent pain control and only has occasional discomfort.  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  She will follow-up with the office as needed and she understands that there will likely be a short period of increased pain when she goes back to working due to the extended amount of standing that she does in a factory.  She may follow-up with [place] if she has continued knee pain or new joint complaints.  Follow-Up Instructions: No follow-ups on file.   Orders:  No orders of the defined types were placed in this encounter.  No orders of the defined types were placed in this encounter.   Imaging: No results found.  PMFS History: Patient Active Problem List   Diagnosis Date Noted   Annual visit for general adult medical examination  without abnormal findings 11/07/2022   Anterior knee pain, right 02/27/2022   Pain and swelling of right lower leg 01/09/2022   Pap smear abnormality of cervix/human papillomavirus (HPV) positive 05/10/2017   Type 2 diabetes mellitus with vascular disease ([place]) 03/26/2016   Hyperlipidemia LDL goal <100 05/14/2013   Low back pain 09/19/2009   Obesity (BMI 30.0-34.9) 03/02/2008   Essential hypertension 03/02/2008   Past Medical History:  Diagnosis Date   Allergy    Annual visit for general adult medical examination without abnormal findings 11/07/2022   Anxiety about health 07/20/2017   Back pain    Bilateral shoulder pain    Diabetes mellitus without complication (HCC)    FH: CAD (coronary artery disease) 09/14/2014   Sibling with MI under age 73    Hyperlipidemia    Hypertension    Metabolic syndrome X 0/27/2536   Mild dysplasia of cervix 12/21/2013   Obesity    Right hip pain     Family History  Problem Relation Age of Onset   Heart disease Mother    Coronary artery disease Father    Heart disease Father    Lung cancer Brother    Brain cancer Brother    Diabetes Sister    Coronary artery disease Sister    Hypertension Sister     No past surgical history on file. 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

## 2022-11-12 MED ORDER — ROSUVASTATIN CALCIUM 10 MG PO TABS: 10.0000 mg | ORAL_TABLET | Freq: Every day | ORAL | 5 refills | Status: DC

## 2022-11-12 NOTE — Addendum Note (Signed)
Addended by: Fayrene Helper on: 11/12/2022 03:57 PM   Modules accepted: Orders

## 2022-11-13 ENCOUNTER — Other Ambulatory Visit: Payer: Self-pay | Admitting: Family Medicine

## 2022-11-13 ENCOUNTER — Encounter: Payer: Commercial Managed Care - PPO | Admitting: Nutrition

## 2022-11-13 DIAGNOSIS — E1159 Type 2 diabetes mellitus with other circulatory complications: Secondary | ICD-10-CM

## 2022-11-13 DIAGNOSIS — E785 Hyperlipidemia, unspecified: Secondary | ICD-10-CM

## 2022-11-13 DIAGNOSIS — I10 Essential (primary) hypertension: Secondary | ICD-10-CM

## 2022-11-24 ENCOUNTER — Ambulatory Visit (HOSPITAL_COMMUNITY): Payer: Commercial Managed Care - PPO

## 2022-11-24 ENCOUNTER — Telehealth: Payer: Self-pay | Admitting: Family Medicine

## 2022-11-24 NOTE — Telephone Encounter (Signed)
Patient asked for Velna Hatchet to return her call at [phone].

## 2022-11-26 ENCOUNTER — Telehealth: Payer: Self-pay | Admitting: Family Medicine

## 2022-11-26 ENCOUNTER — Other Ambulatory Visit: Payer: Self-pay

## 2022-11-26 MED ORDER — AMLODIPINE-OLMESARTAN 5-20 MG PO TABS: 1.0000 | ORAL_TABLET | Freq: Every day | ORAL | 3 refills | Status: DC

## 2022-11-26 NOTE — Telephone Encounter (Signed)
Pt med refilled

## 2022-11-26 NOTE — Telephone Encounter (Signed)
Patient called left voicemail 1:58 pm asked for Brandi to give her a call back at [phone].

## 2022-11-26 NOTE — Telephone Encounter (Signed)
Called patient and left message for them to return call at the office   

## 2022-11-28 ENCOUNTER — Ambulatory Visit (HOSPITAL_COMMUNITY): Payer: Commercial Managed Care - PPO

## 2022-12-05 ENCOUNTER — Ambulatory Visit (HOSPITAL_COMMUNITY)
Admission: RE | Admit: 2022-12-05 | Discharge: 2022-12-05 | Disposition: A | Discharge status: Home / Self Care | Payer: Commercial Managed Care - PPO | Source: Ambulatory Visit | Attending: Family Medicine | Admitting: Family Medicine

## 2022-12-05 DIAGNOSIS — Z1231 Encounter for screening mammogram for malignant neoplasm of breast: Secondary | ICD-10-CM | POA: Insufficient documentation

## 2022-12-18 ENCOUNTER — Encounter: Payer: Commercial Managed Care - PPO | Admitting: Nutrition

## 2023-03-03 ENCOUNTER — Other Ambulatory Visit: Payer: Self-pay | Admitting: Family Medicine

## 2023-03-12 ENCOUNTER — Encounter: Payer: Commercial Managed Care - PPO | Attending: Family Medicine | Admitting: Nutrition

## 2023-03-12 VITALS — Ht 65.5 in | Wt 159.0 lb

## 2023-03-12 DIAGNOSIS — E785 Hyperlipidemia, unspecified: Secondary | ICD-10-CM | POA: Insufficient documentation

## 2023-03-12 DIAGNOSIS — E669 Obesity, unspecified: Secondary | ICD-10-CM | POA: Diagnosis present

## 2023-03-12 DIAGNOSIS — I10 Essential (primary) hypertension: Secondary | ICD-10-CM | POA: Insufficient documentation

## 2023-03-12 DIAGNOSIS — E1159 Type 2 diabetes mellitus with other circulatory complications: Secondary | ICD-10-CM | POA: Insufficient documentation

## 2023-03-12 NOTE — Progress Notes (Signed)
Medical Nutrition Therapy  Appointment Start time:  1530  Appointment End time:  1600  Primary concerns today: DM Type 2. Referral diagnosis: E11.8,  Preferred learning style: No preference. Learning readiness: Changes in progress    NUTRITION ASSESSMENT Follow up DM   62 yr old bfemale here for Type 2 DM. She would like to work on eating healthier and being more active. She notes she has gained a few pounds since being less active due to her knee issues.  Had a tear in her right knee and hasn't been as active as she was before. Changes: Has slipped back into some old habits since her surgery in Oct 2023. Looking forward to gettting back outside and mowing her yard and do her flowers to be more active. A1C up from 6.5% to 6.6%. Increase in LDL levels. Gained 5 lbs. Lab Results  Component Value Date   HGBA1C 6.6 (H) 11/05/2022   .LDL up to 130 mg/dl and TCHOL 161, up from 199 mg/dl before. She notes she has eaten more over the holidays of foods she knows she should avoid.  She is willing to get back on track with eating better and more whole plant based foods and being more intentional with exercise.  Anthropometrics  Wt Readings from Last 3 Encounters:  03/12/23 159 lb (72.1 kg)  11/10/22 154 lb (69.9 kg)  11/05/22 154 lb 0.6 oz (69.9 kg)   Ht Readings from Last 3 Encounters:  03/12/23 5' 5.5" (1.664 m)  11/10/22 5\' 1"  (1.549 m)  11/05/22 5\' 1"  (1.549 m)   Body mass index is 26.06 kg/m. @BMIFA @ Facility age limit for growth %iles is 20 years. Facility age limit for growth %iles is 20 years.   Clinical Medical Hx: See chart Medications: see chart NO DM medications currently Labs:  Lab Results  Component Value Date   HGBA1C 6.6 (H) 11/05/2022      Latest Ref Rng & Units 11/05/2022    4:44 PM 07/04/2022    4:02 PM 10/30/2021    4:27 PM  CMP  Glucose 70 - 99 mg/dL 72  76  93   BUN 8 - 27 mg/dL 11  11  10    Creatinine 0.57 - 1.00 mg/dL 0.96  0.45  4.09   Sodium  134 - 144 mmol/L 142  142  140   Potassium 3.5 - 5.2 mmol/L 4.5  3.8  3.7   Chloride 96 - 106 mmol/L 103  100  97   CO2 20 - 29 mmol/L 26  28  23    Calcium 8.7 - 10.3 mg/dL 81.1  91.4  78.2   Total Protein 6.0 - 8.5 g/dL 7.3  7.2    Total Bilirubin 0.0 - 1.2 mg/dL 0.2  0.4    Alkaline Phos 44 - 121 IU/L 97  98    AST 0 - 40 IU/L 18  21    ALT 0 - 32 IU/L 13  17     Lipid Panel     Component Value Date/Time   CHOL 211 (H) 11/05/2022 1644   TRIG 77 11/05/2022 1644   HDL 77 11/05/2022 1644   CHOLHDL 2.7 11/05/2022 1644   CHOLHDL 2.6 08/31/2020 0812   VLDL 8 07/09/2017 1052   LDLCALC 120 (H) 11/05/2022 1644   LDLCALC 113 (H) 08/31/2020 0812   LABVLDL 14 11/05/2022 1644    Notable Signs/Symptoms: None  Lifestyle & Dietary Hx B) Fruit L) Oodles and noodles,  D) Sandwich, water.  Estimated daily  fluid intake: 40 oz- drinks diet sodas Supplements: none Sleep: good Stress / self-care: no Current average weekly physical activity: ADL due to knee issues  24-Hr Dietary Recall Eats 2-3 meals per day  Estimated Energy Needs Calories: 1500 Carbohydrate: 170g Protein: 112g Fat: 42g   NUTRITION DIAGNOSIS  NB-1.1 Food and nutrition-related knowledge deficit As related to Diabetes Type 2.  As evidenced by A1C of 7%.   NUTRITION INTERVENTION  Nutrition education (E-1) on the following topics:  Nutrition and Diabetes education provided on My Plate, CHO counting, meal planning, portion sizes, timing of meals, avoiding snacks between meals unless having a low blood sugar, target ranges for A1C and blood sugars, signs/symptoms and treatment of hyper/hypoglycemia, monitoring blood sugars, taking medications as prescribed, benefits of exercising 30 minutes per day and prevention of complications of DM. Lifestyle Medicine  - Whole Food, Plant Predominant Nutrition is highly recommended: Eat Plenty of vegetables, Mushrooms, fruits, Legumes, Whole Grains, Nuts, seeds in lieu of processed  meats, processed snacks/pastries red meat, poultry, eggs.    -It is better to avoid simple carbohydrates including: Cakes, Sweet Desserts, Ice Cream, Soda (diet and regular), Sweet Tea, Candies, Chips, Cookies, Store Bought Juices, Alcohol in Excess of  1-2 drinks a day, Lemonade,  Artificial Sweeteners, Doughnuts, Coffee Creamers, "Sugar-free" Products, etc, etc.  This is not a complete list.....  Exercise: If you are able: 30 -60 minutes a day ,4 days a week, or 150 minutes a week.  The longer the better.  Combine stretch, strength, and aerobic activities.  If you were told in the past that you have high risk for cardiovascular diseases, you may seek evaluation by your heart doctor prior to initiating moderate to intense exercise programs.   Handouts Provided Include  Lifestyle medicine Meal Plan Card Fullplateliving handout  Learning Style & Readiness for Change Teaching method utilized: Visual & Auditory  Demonstrated degree of understanding via: Teach Back  Barriers to learning/adherence to lifestyle change: none  Goals Established by Pt Goals  Get rid of sodas's 100% Get A1C to 5.7% or less. Lose 10 lbs  in the next 3 week. Talk to MD about reducing some BP medicines if needed. Need to continue high fiber foods and get your LDL-bad cholesterol down to 70 or below. Keep up the great job!!  MONITORING & EVALUATION Dietary intake, weekly physical activity, and blood sugars  in 3 month.. May consider reducing BP medications as needed due to lifestyle changes and weight loss.  Next Steps  Patient is to work on meal planning and meal prepping.Marland Kitchen

## 2023-03-12 NOTE — Patient Instructions (Signed)
GOals  Get back to focusing on more whole plant based foods Egg whites and fruit for breakfast or oatmeal, Drink 5-6 bottles of water per day Cut out processed foods Get A1C down to 6%

## 2023-04-02 ENCOUNTER — Other Ambulatory Visit (INDEPENDENT_AMBULATORY_CARE_PROVIDER_SITE_OTHER): Payer: Commercial Managed Care - PPO

## 2023-04-02 ENCOUNTER — Ambulatory Visit (INDEPENDENT_AMBULATORY_CARE_PROVIDER_SITE_OTHER): Payer: Commercial Managed Care - PPO | Admitting: Surgical

## 2023-04-02 DIAGNOSIS — M25561 Pain in right knee: Secondary | ICD-10-CM | POA: Diagnosis not present

## 2023-04-02 DIAGNOSIS — M1711 Unilateral primary osteoarthritis, right knee: Secondary | ICD-10-CM

## 2023-04-04 ENCOUNTER — Encounter: Payer: Self-pay | Admitting: Surgical

## 2023-04-04 NOTE — Progress Notes (Signed)
Follow-up Office Visit Note   Patient: Kathleen Kirby           Date of Birth: Dec 01, 1961           MRN: [id number] Visit Date: 04/02/2023 Requested by: Kerri Perches, MD [address] [place],  [place] [address] PCP: Kerri Perches, MD  Subjective: Chief Complaint  Patient presents with   Right Knee - Pain    HPI: Kathleen Kirby is a 62 y.o. female who returns to the office for follow-up visit.    Plan at last visit was: Plan is to continue with quadricep strengthening exercises and stationary bike. Recommended against using treadmill consistently. She has excellent pain control and only has occasional discomfort. 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 She will follow-up with the office as needed and she understands that there will likely be a short period of increased pain when she goes back to working due to the extended amount of standing that she does in a factory. She may follow-up with [place] if she has continued knee pain or new joint complaints.  S/p right knee arthroscopy on 10/06/2022  Since then, patient notes she had an incident on 03/14/2023 where she squatted down very low to pick something up and felt significant amount of discomfort in her right knee.  She denies any feeling of a popping sensation at that time.  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  She notes pain if she squats down to low and most of her pain is in the medial aspect of the knee.  She feels like the leg wants to give out on her at times but she has no incidence of instability.  No groin pain, numbness/tingling, radicular pain.  She has no posterior pain in the right knee.  Taking Tylenol with some relief.              ROS: All systems reviewed are negative as they relate to the chief complaint within the history of present illness.  Patient denies fevers or chills.  Assessment & Plan: Visit Diagnoses:  1. Unilateral primary  osteoarthritis, right knee   2. Right knee pain, unspecified chronicity     Plan: Kathleen Kirby is a 62 y.o. female who returns to the office for follow-up visit for right knee pain.  Plan from last visit was noted above in HPI.  They now return with onset of right knee pain on 03/14/2023 after a squatting incident.  No popping incident at that time and she has no tenderness in the posterior aspect of the right knee that is concerning for meniscal root tear.  No significant effusion on exam today.  She has radiographs demonstrating medial compartment osteoarthritis rated mild to moderate.  This is consistent with her arthroscopy pictures from her prior knee scope on 10/06/2022.  Overall her symptoms have been improving over the last couple weeks to the point where she even considered not coming in today.  Did offer to set her up for formal physical therapy or injection today in the clinic.  She would like to hold off on any intervention given the consistent resolution she has had over the last couple weeks and she will return to the office as needed if she notices that the knee pain persists.  We could try an injection at that point.  Patient agreed with plan.  Follow-up as needed.  Follow-Up  Instructions: No follow-ups on file.   Orders:  Orders Placed This Encounter  Procedures   XR KNEE 3 VIEW RIGHT   No orders of the defined types were placed in this encounter.     Procedures: No procedures performed   Clinical Data: No additional findings.  Objective: Vital Signs: LMP 09/29/2012   Physical Exam:  Constitutional: Patient appears well-developed HEENT:  Head: Normocephalic Eyes:EOM are normal Neck: Normal range of motion Cardiovascular: Normal rate Pulmonary/chest: Effort normal Neurologic: Patient is alert Skin: Skin is warm Psychiatric: Patient has normal mood and affect  Ortho Exam: Ortho exam demonstrates right knee with no significant effusion.  She has mild tenderness  over the medial joint line.  No tenderness over the lateral joint line.  Range of motion from 0 degrees extension to 120 degrees of knee flexion.  No calf tenderness.  Negative Homans' sign.  No pain with hip range of motion.  Able to perform straight leg raise without extensor lag.  No tenderness at the posterior aspect of the joint.  She is ambulating without significant antalgia.  Specialty Comments:  No specialty comments available.  Imaging: No results found.   PMFS History: Patient Active Problem List   Diagnosis Date Noted   Annual visit for general adult medical examination without abnormal findings 11/07/2022   Anterior knee pain, right 02/27/2022   Pain and swelling of right lower leg 01/09/2022   Pap smear abnormality of cervix/human papillomavirus (HPV) positive 05/10/2017   Type 2 diabetes mellitus with vascular disease 03/26/2016   Hyperlipidemia LDL goal <100 05/14/2013   Low back pain 09/19/2009   Obesity (BMI 30.0-34.9) 03/02/2008   Essential hypertension 03/02/2008   Past Medical History:  Diagnosis Date   Allergy    Annual visit for general adult medical examination without abnormal findings 11/07/2022   Anxiety about health 07/20/2017   Back pain    Bilateral shoulder pain    Diabetes mellitus without complication (HCC)    FH: CAD (coronary artery disease) 09/14/2014   Sibling with MI under age 78    Hyperlipidemia    Hypertension    Metabolic syndrome X 05/14/2013   Mild dysplasia of cervix 12/21/2013   Obesity    Right hip pain     Family History  Problem Relation Age of Onset   Heart disease Mother    Coronary artery disease Father    Heart disease Father    Lung cancer Brother    Brain cancer Brother    Diabetes Sister    Coronary artery disease Sister    Hypertension Sister     No past surgical history on file. 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

## 2023-04-09 ENCOUNTER — Ambulatory Visit: Payer: Commercial Managed Care - PPO | Admitting: Family Medicine

## 2023-04-30 ENCOUNTER — Ambulatory Visit: Payer: Commercial Managed Care - PPO | Admitting: Family Medicine

## 2023-05-14 ENCOUNTER — Other Ambulatory Visit: Payer: Self-pay | Admitting: Internal Medicine

## 2023-05-14 DIAGNOSIS — I10 Essential (primary) hypertension: Secondary | ICD-10-CM

## 2023-05-18 ENCOUNTER — Encounter: Payer: Self-pay | Admitting: Nutrition

## 2023-05-27 ENCOUNTER — Other Ambulatory Visit: Payer: Self-pay | Admitting: Family Medicine

## 2023-05-30 LAB — CMP14+EGFR
ALT: 24 IU/L (ref 0–32)
AST: 29 IU/L (ref 0–40)
Albumin/Globulin Ratio: 2
Albumin: 4.4 g/dL (ref 3.9–4.9)
Alkaline Phosphatase: 103 IU/L (ref 44–121)
BUN/Creatinine Ratio: 14 (ref 12–28)
BUN: 11 mg/dL (ref 8–27)
Bilirubin Total: 0.3 mg/dL (ref 0.0–1.2)
CO2: 23 mmol/L (ref 20–29)
Calcium: 10.3 mg/dL (ref 8.7–10.3)
Chloride: 100 mmol/L (ref 96–106)
Creatinine, Ser: 0.8 mg/dL (ref 0.57–1.00)
Globulin, Total: 2.2 g/dL (ref 1.5–4.5)
Glucose: 114 mg/dL — ABNORMAL HIGH (ref 70–99)
Potassium: 3.7 mmol/L (ref 3.5–5.2)
Sodium: 137 mmol/L (ref 134–144)
Total Protein: 6.6 g/dL (ref 6.0–8.5)
eGFR: 84 mL/min/{1.73_m2} (ref >59)

## 2023-05-30 LAB — LIPID PANEL
Chol/HDL Ratio: 2.2 ratio (ref 0.0–4.4)
Cholesterol, Total: 184 mg/dL (ref 100–199)
HDL: 83 mg/dL (ref >39)
LDL Chol Calc (NIH): 93 mg/dL (ref 0–99)
Triglycerides: 41 mg/dL (ref 0–149)
VLDL Cholesterol Cal: 8 mg/dL (ref 5–40)

## 2023-05-30 LAB — HEMOGLOBIN A1C
Est. average glucose Bld gHb Est-mCnc: 143 mg/dL
Hgb A1c MFr Bld: 6.6 % — ABNORMAL HIGH (ref 4.8–5.6)

## 2023-06-05 ENCOUNTER — Encounter: Payer: Self-pay | Admitting: Family Medicine

## 2023-06-05 ENCOUNTER — Ambulatory Visit (INDEPENDENT_AMBULATORY_CARE_PROVIDER_SITE_OTHER): Payer: Commercial Managed Care - PPO | Admitting: Family Medicine

## 2023-06-05 VITALS — BP 115/63 | HR 79 | Resp 16 | Ht 61.0 in | Wt 159.0 lb

## 2023-06-05 DIAGNOSIS — I10 Essential (primary) hypertension: Secondary | ICD-10-CM

## 2023-06-05 DIAGNOSIS — E559 Vitamin D deficiency, unspecified: Secondary | ICD-10-CM | POA: Diagnosis not present

## 2023-06-05 DIAGNOSIS — E669 Obesity, unspecified: Secondary | ICD-10-CM

## 2023-06-05 DIAGNOSIS — Z1231 Encounter for screening mammogram for malignant neoplasm of breast: Secondary | ICD-10-CM

## 2023-06-05 DIAGNOSIS — E785 Hyperlipidemia, unspecified: Secondary | ICD-10-CM | POA: Diagnosis not present

## 2023-06-05 DIAGNOSIS — E1159 Type 2 diabetes mellitus with other circulatory complications: Secondary | ICD-10-CM

## 2023-06-05 MED ORDER — ROSUVASTATIN CALCIUM 10 MG PO TABS: 10.0000 mg | ORAL_TABLET | Freq: Every day | ORAL | 1 refills | Status: DC

## 2023-06-05 MED ORDER — CYCLOBENZAPRINE HCL 10 MG PO TABS: ORAL_TABLET | ORAL | 0 refills | Status: DC

## 2023-06-05 MED ORDER — LABETALOL HCL 100 MG PO TABS: 100.0000 mg | ORAL_TABLET | Freq: Two times a day (BID) | ORAL | 3 refills | Status: DC

## 2023-06-05 MED ORDER — TRIAMTERENE-HCTZ 75-50 MG PO TABS: 1.0000 | ORAL_TABLET | Freq: Every day | ORAL | 3 refills | Status: DC

## 2023-06-05 MED ORDER — AMLODIPINE-OLMESARTAN 5-20 MG PO TABS: 1.0000 | ORAL_TABLET | Freq: Every day | ORAL | 3 refills | Status: DC

## 2023-06-05 NOTE — Patient Instructions (Signed)
Annual exam 11/25 or after , call if you need me sooner   CONGRATS, excellent labs and blood pressure  Microaalb, CBC, fasting lipid, cmp and EGFR, HBA1C, TSH and vit D 3 to 7 days before appt  Please schedule mammogram at checkout  It is important that you exercise regularly at least 30 minutes 5 times a week. If you develop chest pain, have severe difficulty breathing, or feel very tired, stop exercising immediately and seek medical attention   Think about what you will eat, plan ahead. Choose " clean, green, fresh or frozen" over canned, processed or packaged foods which are more sugary, salty and fatty. 70 to 75% of food eaten should be vegetables and fruit. Three meals at set times with snacks allowed between meals, but they must be fruit or vegetables. Aim to eat over a 12 hour period , example 7 am to 7 pm, and STOP after  your last meal of the day. Drink water,generally about 64 ounces per day, no other drink is as healthy. Fruit juice is best enjoyed in a healthy way, by EATING the fruit.  Thanks for choosing [facility] we consider it a privelige to serve you.

## 2023-06-06 ENCOUNTER — Encounter: Payer: Self-pay | Admitting: Family Medicine

## 2023-06-06 DIAGNOSIS — E559 Vitamin D deficiency, unspecified: Secondary | ICD-10-CM | POA: Insufficient documentation

## 2023-06-06 NOTE — Progress Notes (Signed)
Kathleen Kirby     MRN: [id number]      DOB: Jul 14, 1961  Chief Complaint  Patient presents with   Diabetes    Follow up visit. Wants you to check her big toes. Not painful but seems puffy rubbing up against certain shoes    HPI Kathleen Kirby is here for follow up and re-evaluation of chronic medical conditions, medication management and review of any available recent lab and radiology data.  Preventive health is updated, specifically  Cancer screening and Immunization.   Questions or concerns regarding consultations or procedures which the PT has had in the interim are  addressed. The PT denies any adverse reactions to current medications since the last visit.  ROS Denies recent fever or chills. Denies sinus pressure, nasal congestion, ear pain or sore throat. Denies chest congestion, productive cough or wheezing. Denies chest pains, palpitations and leg swelling Denies abdominal pain, nausea, vomiting,diarrhea or constipation.   Denies dysuria, frequency, hesitancy or incontinence. Denies joint pain, swelling and limitation in mobility. Denies headaches, seizures, numbness, or tingling. Denies depression, anxiety or insomnia. Denies skin break down or rash.c/o cysts on big toes which are uncomfortable depending on the shoe she is wearing   PE  BP 115/63   Pulse 79   Resp 16   Ht 5\' 1"  (1.549 m)   Wt 159 lb (72.1 kg)   LMP 09/29/2012   SpO2 97%   BMI 30.04 kg/m   Patient alert and oriented and in no cardiopulmonary distress.  HEENT: No facial asymmetry, EOMI,     Neck supple .  Chest: Clear to auscultation bilaterally.  CVS: S1, S2 no murmurs, no S3.Regular rate.  ABD: Soft non tender.   Ext: No edema  MS: Adequate ROM spine, shoulders, hips and knees.  Skin: Intact, no ulcerations or rash noted.non infected nodules/cysts pea sized on great toes, noingrown toenails  Psych: Good eye contact, normal affect. Memory intact not anxious or depressed appearing.  CNS:  CN 2-12 intact, power,  normal throughout.no focal deficits noted.   Assessment & Plan  Type 2 diabetes mellitus with vascular disease (HCC) Kathleen Kirby is reminded of the importance of commitment to daily physical activity for 30 minutes or more, as able and the need to limit carbohydrate intake to 30 to 60 grams per meal to help with blood sugar control.   Kathleen Kirby is reminded of the importance of daily foot exam, annual eye examination, and good blood sugar, blood pressure and cholesterol control.     Latest Ref Rng & Units 05/29/2023    9:14 AM 11/05/2022    4:44 PM 07/04/2022    4:02 PM 02/27/2022    4:51 PM 10/30/2021    4:27 PM  Diabetic Labs  HbA1c 4.8 - 5.6 % 6.6  6.6  6.5  7.0  7.0   Micro/Creat Ratio 0 - 29 mg/g creat  9      Chol 100 - 199 mg/dL 409  811  914   782   HDL >39 mg/dL 83  77  85   84   Calc LDL 0 - 99 mg/dL 93  956  213   086   Triglycerides 0 - 149 mg/dL 41  77  53   46   Creatinine 0.57 - 1.00 mg/dL 5.78  4.69  6.29   5.28       06/05/2023    4:14 PM 03/12/2023    3:59 PM 11/10/2022   12:46 PM 11/05/2022  3:53 PM 07/04/2022    3:43 PM 07/04/2022    2:46 PM 06/26/2022    2:34 PM  BP/Weight  Systolic BP 115   115 130 138   Diastolic BP 63   70 70 70   Wt. (Lbs) 159 159 154 154.04  152.12 155  BMI 30.04 kg/m2 26.06 kg/m2 29.1 kg/m2 29.11 kg/m2  28.28 kg/m2 28.81 kg/m2      11/05/2022    4:00 PM 10/30/2021    3:00 PM  Foot/eye exam completion dates  Foot Form Completion Done Done        Obesity (BMI 30.0-34.9)  Patient re-educated about  the importance of commitment to a  minimum of 150 minutes of exercise per week as able.  The importance of healthy food choices with portion control discussed, as well as eating regularly and within a 12 hour window most days. The need to choose "clean , green" food 50 to 75% of the time is discussed, as well as to make water the primary drink and set a goal of 64 ounces water daily.       06/05/2023     4:14 PM 03/12/2023    3:59 PM 11/10/2022   12:46 PM  Weight /BMI  Weight 159 lb 159 lb 154 lb  Height 5\' 1"  (1.549 m) 5' 5.5" (1.664 m) 5\' 1"  (1.549 m)  BMI 30.04 kg/m2 26.06 kg/m2 29.1 kg/m2    unchnaged  Hyperlipidemia LDL goal <100 Hyperlipidemia:Low fat diet discussed and encouraged.   Lipid Panel  Lab Results  Component Value Date   CHOL 184 05/29/2023   HDL 83 05/29/2023   LDLCALC 93 05/29/2023   TRIG 41 05/29/2023   CHOLHDL 2.2 05/29/2023     Controlled, no change in medication   Essential hypertension Controlled, no change in medication DASH diet and commitment to daily physical activity for a minimum of 30 minutes discussed and encouraged, as a part of hypertension management. The importance of attaining a healthy weight is also discussed.     06/05/2023    4:14 PM 03/12/2023    3:59 PM 11/10/2022   12:46 PM 11/05/2022    3:53 PM 07/04/2022    3:43 PM 07/04/2022    2:46 PM 06/26/2022    2:34 PM  BP/Weight  Systolic BP 115   115 130 138   Diastolic BP 63   70 70 70   Wt. (Lbs) 159 159 154 154.04  152.12 155  BMI 30.04 kg/m2 26.06 kg/m2 29.1 kg/m2 29.11 kg/m2  28.28 kg/m2 28.81 kg/m2       Vitamin D deficiency Updated lab needed at/ before next visit.

## 2023-06-06 NOTE — Assessment & Plan Note (Signed)
Hyperlipidemia:Low fat diet discussed and encouraged.   Lipid Panel  Lab Results  Component Value Date   CHOL 184 05/29/2023   HDL 83 05/29/2023   LDLCALC 93 05/29/2023   TRIG 41 05/29/2023   CHOLHDL 2.2 05/29/2023     Controlled, no change in medication

## 2023-06-06 NOTE — Assessment & Plan Note (Signed)
  Patient re-educated about  the importance of commitment to a  minimum of 150 minutes of exercise per week as able.  The importance of healthy food choices with portion control discussed, as well as eating regularly and within a 12 hour window most days. The need to choose "clean , green" food 50 to 75% of the time is discussed, as well as to make water the primary drink and set a goal of 64 ounces water daily.       06/05/2023    4:14 PM 03/12/2023    3:59 PM 11/10/2022   12:46 PM  Weight /BMI  Weight 159 lb 159 lb 154 lb  Height 5\' 1"  (1.549 m) 5' 5.5" (1.664 m) 5\' 1"  (1.549 m)  BMI 30.04 kg/m2 26.06 kg/m2 29.1 kg/m2    unchnaged

## 2023-06-06 NOTE — Assessment & Plan Note (Signed)
Updated lab needed at/ before next visit.   

## 2023-06-06 NOTE — Assessment & Plan Note (Signed)
Ms. Massiah is reminded of the importance of commitment to daily physical activity for 30 minutes or more, as able and the need to limit carbohydrate intake to 30 to 60 grams per meal to help with blood sugar control.   Ms. Kuehne is reminded of the importance of daily foot exam, annual eye examination, and good blood sugar, blood pressure and cholesterol control.     Latest Ref Rng & Units 05/29/2023    9:14 AM 11/05/2022    4:44 PM 07/04/2022    4:02 PM 02/27/2022    4:51 PM 10/30/2021    4:27 PM  Diabetic Labs  HbA1c 4.8 - 5.6 % 6.6  6.6  6.5  7.0  7.0   Micro/Creat Ratio 0 - 29 mg/g creat  9      Chol 100 - 199 mg/dL 161  096  045   409   HDL >39 mg/dL 83  77  85   84   Calc LDL 0 - 99 mg/dL 93  811  914   782   Triglycerides 0 - 149 mg/dL 41  77  53   46   Creatinine 0.57 - 1.00 mg/dL 9.56  2.13  0.86   5.78       06/05/2023    4:14 PM 03/12/2023    3:59 PM 11/10/2022   12:46 PM 11/05/2022    3:53 PM 07/04/2022    3:43 PM 07/04/2022    2:46 PM 06/26/2022    2:34 PM  BP/Weight  Systolic BP 115   115 130 138   Diastolic BP 63   70 70 70   Wt. (Lbs) 159 159 154 154.04  152.12 155  BMI 30.04 kg/m2 26.06 kg/m2 29.1 kg/m2 29.11 kg/m2  28.28 kg/m2 28.81 kg/m2      11/05/2022    4:00 PM 10/30/2021    3:00 PM  Foot/eye exam completion dates  Foot Form Completion Done Done

## 2023-06-06 NOTE — Assessment & Plan Note (Signed)
Controlled, no change in medication DASH diet and commitment to daily physical activity for a minimum of 30 minutes discussed and encouraged, as a part of hypertension management. The importance of attaining a healthy weight is also discussed.     06/05/2023    4:14 PM 03/12/2023    3:59 PM 11/10/2022   12:46 PM 11/05/2022    3:53 PM 07/04/2022    3:43 PM 07/04/2022    2:46 PM 06/26/2022    2:34 PM  BP/Weight  Systolic BP 115   115 130 138   Diastolic BP 63   70 70 70   Wt. (Lbs) 159 159 154 154.04  152.12 155  BMI 30.04 kg/m2 26.06 kg/m2 29.1 kg/m2 29.11 kg/m2  28.28 kg/m2 28.81 kg/m2

## 2023-07-07 ENCOUNTER — Other Ambulatory Visit: Payer: Self-pay | Admitting: Family Medicine

## 2023-08-13 ENCOUNTER — Ambulatory Visit: Payer: Commercial Managed Care - PPO | Admitting: Nutrition

## 2023-09-17 ENCOUNTER — Encounter: Payer: Commercial Managed Care - PPO | Attending: Family Medicine | Admitting: Nutrition

## 2023-09-17 VITALS — Ht 61.5 in | Wt 159.0 lb

## 2023-09-17 DIAGNOSIS — E1159 Type 2 diabetes mellitus with other circulatory complications: Secondary | ICD-10-CM | POA: Insufficient documentation

## 2023-09-17 DIAGNOSIS — E66811 Obesity, class 1: Secondary | ICD-10-CM | POA: Insufficient documentation

## 2023-09-17 DIAGNOSIS — I10 Essential (primary) hypertension: Secondary | ICD-10-CM | POA: Diagnosis present

## 2023-09-17 DIAGNOSIS — E785 Hyperlipidemia, unspecified: Secondary | ICD-10-CM | POA: Diagnosis present

## 2023-09-17 NOTE — Progress Notes (Signed)
Medical Nutrition Therapy  Appointment Start time:  1630  Appointment End time:  1645  Primary concerns today: DM Type 2. Referral diagnosis: E11.8,  Preferred learning style: No preference. Learning readiness: Changes in progress    NUTRITION ASSESSMENT Follow up DM Working 5 days 12 hrs days and 8 hrs on Friday. CHanges made: Eating more fruits and green vegetables. Not using salt and trying to eat healthier foods. Watching portions. No recent A1C   Lab Results  Component Value Date   HGBA1C 6.6 (H) 05/29/2023   .LDL up to 130 mg/dl and TCHOL 540, up from 199 mg/dl before. She notes she has eaten more over the holidays of foods she knows she should avoid.  She is willing to get back on track with eating better and more whole plant based foods and being more intentional with exercise.  Anthropometrics  Wt Readings from Last 3 Encounters:  06/05/23 159 lb (72.1 kg)  03/12/23 159 lb (72.1 kg)  11/10/22 154 lb (69.9 kg)   Ht Readings from Last 3 Encounters:  06/05/23 5\' 1"  (1.549 m)  03/12/23 5' 5.5" (1.664 m)  11/10/22 5\' 1"  (1.549 m)   There is no height or weight on file to calculate BMI. @BMIFA @ Facility age limit for growth %iles is 20 years. Facility age limit for growth %iles is 20 years.   Clinical Medical Hx: See chart Medications: see chart NO DM medications currently Labs:  Lab Results  Component Value Date   HGBA1C 6.6 (H) 05/29/2023      Latest Ref Rng & Units 05/29/2023    9:14 AM 11/05/2022    4:44 PM 07/04/2022    4:02 PM  CMP  Glucose 70 - 99 mg/dL 981  72  76   BUN 8 - 27 mg/dL 11  11  11    Creatinine 0.57 - 1.00 mg/dL 1.91  4.78  2.95   Sodium 134 - 144 mmol/L 137  142  142   Potassium 3.5 - 5.2 mmol/L 3.7  4.5  3.8   Chloride 96 - 106 mmol/L 100  103  100   CO2 20 - 29 mmol/L 23  26  28    Calcium 8.7 - 10.3 mg/dL 62.1  30.8  65.7   Total Protein 6.0 - 8.5 g/dL 6.6  7.3  7.2   Total Bilirubin 0.0 - 1.2 mg/dL 0.3  0.2  0.4   Alkaline Phos  44 - 121 IU/L 103  97  98   AST 0 - 40 IU/L 29  18  21    ALT 0 - 32 IU/L 24  13  17     Lipid Panel     Component Value Date/Time   CHOL 184 05/29/2023 0914   TRIG 41 05/29/2023 0914   HDL 83 05/29/2023 0914   CHOLHDL 2.2 05/29/2023 0914   CHOLHDL 2.6 08/31/2020 0812   VLDL 8 07/09/2017 1052   LDLCALC 93 05/29/2023 0914   LDLCALC 113 (H) 08/31/2020 0812   LABVLDL 8 05/29/2023 0914    Notable Signs/Symptoms: None  Lifestyle & Dietary Hx B) Fruit and boiled egg L) spaghetti, water D) spaghetti, salad and water..  Estimated daily fluid intake: 40 oz- drinks diet sodas Supplements: none Sleep: good Stress / self-care: no Current average weekly physical activity: ADL due to knee issues  24-Hr Dietary Recall Eats 2-3 meals per day  Estimated Energy Needs Calories: 1500 Carbohydrate: 170g Protein: 112g Fat: 42g   NUTRITION DIAGNOSIS  NB-1.1 Food and nutrition-related knowledge deficit As  related to Diabetes Type 2.  As evidenced by A1C of 7%.   NUTRITION INTERVENTION  Nutrition education (E-1) on the following topics:  Nutrition and Diabetes education provided on My Plate, CHO counting, meal planning, portion sizes, timing of meals, avoiding snacks between meals unless having a low blood sugar, target ranges for A1C and blood sugars, signs/symptoms and treatment of hyper/hypoglycemia, monitoring blood sugars, taking medications as prescribed, benefits of exercising 30 minutes per day and prevention of complications of DM. Lifestyle Medicine  - Whole Food, Plant Predominant Nutrition is highly recommended: Eat Plenty of vegetables, Mushrooms, fruits, Legumes, Whole Grains, Nuts, seeds in lieu of processed meats, processed snacks/pastries red meat, poultry, eggs.    -It is better to avoid simple carbohydrates including: Cakes, Sweet Desserts, Ice Cream, Soda (diet and regular), Sweet Tea, Candies, Chips, Cookies, Store Bought Juices, Alcohol in Excess of  1-2 drinks a day,  Lemonade,  Artificial Sweeteners, Doughnuts, Coffee Creamers, "Sugar-free" Products, etc, etc.  This is not a complete list.....  Exercise: If you are able: 30 -60 minutes a day ,4 days a week, or 150 minutes a week.  The longer the better.  Combine stretch, strength, and aerobic activities.  If you were told in the past that you have high risk for cardiovascular diseases, you may seek evaluation by your heart doctor prior to initiating moderate to intense exercise programs.   Handouts Provided Include  Lifestyle medicine Meal Plan Card Fullplateliving handout  Learning Style & Readiness for Change Teaching method utilized: Visual & Auditory  Demonstrated degree of understanding via: Teach Back  Barriers to learning/adherence to lifestyle change: none  Goals Established by Pt   Work on meal planning to have better food choices. Increase more whole plant based foods Get A1C 6%. Or less.   MONITORING & EVALUATION Dietary intake, weekly physical activity, and blood sugars  in 3 month.. May consider reducing BP medications as needed due to lifestyle changes and weight loss.  Next Steps  Patient is to work on meal planning and meal prepping.Marland Kitchen

## 2023-09-17 NOTE — Patient Instructions (Addendum)
Goals  Work on meal planning to have better food choices. Increase more whole plant based foods Get A1C 6%. Or less.

## 2023-10-01 ENCOUNTER — Encounter: Payer: Self-pay | Admitting: Nutrition

## 2023-11-06 ENCOUNTER — Encounter: Payer: Commercial Managed Care - PPO | Admitting: Family Medicine

## 2023-11-09 NOTE — Patient Instructions (Signed)

## 2023-11-09 NOTE — Progress Notes (Unsigned)
   Established Patient Office Visit   Subjective  Patient ID: Kathleen Kirby, female    DOB: [birth year]  Age: 62 y.o. MRN: [id number]  No chief complaint on file.   She  has a past medical history of Allergy, Annual visit for general adult medical examination without abnormal findings (11/07/2022), Anxiety about health (07/20/2017), Back pain, Bilateral shoulder pain, Diabetes mellitus without complication (HCC), FH: CAD (coronary artery disease) (09/14/2014), Hyperlipidemia, Hypertension, Metabolic syndrome X (05/14/2013), Mild dysplasia of cervix (12/21/2013), Obesity, and Right hip pain.  HPI  ROS    Objective:     LMP 09/29/2012  {Vitals History (Optional):23777}  Physical Exam   No results found for any visits on 11/10/23.  The 10-year ASCVD risk score (Arnett DK, et al., 2019) is: 10.4%    Assessment & Plan:  There are no diagnoses linked to this encounter.  No follow-ups on file.   Kathleen Lederer Newman Nip, FNP

## 2023-11-10 ENCOUNTER — Ambulatory Visit (INDEPENDENT_AMBULATORY_CARE_PROVIDER_SITE_OTHER): Payer: Commercial Managed Care - PPO | Admitting: Family Medicine

## 2023-11-10 ENCOUNTER — Encounter: Payer: Self-pay | Admitting: Family Medicine

## 2023-11-10 VITALS — BP 144/80 | HR 83 | Ht 61.5 in

## 2023-11-10 DIAGNOSIS — E038 Other specified hypothyroidism: Secondary | ICD-10-CM

## 2023-11-10 DIAGNOSIS — Z23 Encounter for immunization: Secondary | ICD-10-CM | POA: Diagnosis not present

## 2023-11-10 DIAGNOSIS — E1159 Type 2 diabetes mellitus with other circulatory complications: Secondary | ICD-10-CM | POA: Diagnosis not present

## 2023-11-10 DIAGNOSIS — R252 Cramp and spasm: Secondary | ICD-10-CM

## 2023-11-10 DIAGNOSIS — Z0001 Encounter for general adult medical examination with abnormal findings: Secondary | ICD-10-CM | POA: Diagnosis not present

## 2023-11-10 DIAGNOSIS — E119 Type 2 diabetes mellitus without complications: Secondary | ICD-10-CM

## 2023-11-10 DIAGNOSIS — Z0000 Encounter for general adult medical examination without abnormal findings: Secondary | ICD-10-CM

## 2023-11-10 DIAGNOSIS — D509 Iron deficiency anemia, unspecified: Secondary | ICD-10-CM

## 2023-11-10 DIAGNOSIS — E559 Vitamin D deficiency, unspecified: Secondary | ICD-10-CM

## 2023-11-10 DIAGNOSIS — I10 Essential (primary) hypertension: Secondary | ICD-10-CM | POA: Diagnosis not present

## 2023-11-10 MED ORDER — ROSUVASTATIN CALCIUM 10 MG PO TABS: 10.0000 mg | ORAL_TABLET | Freq: Every day | ORAL | 1 refills | Status: DC

## 2023-11-10 NOTE — Assessment & Plan Note (Addendum)
Vitals:   11/10/23 1538 11/10/23 1559  BP: (!) 155/68 (!) 144/80  Blood pressure not controlled in today's visit Current Regimen: amLODipine-olmesartan (AZOR) 5-20 MG tablet,  labetalol (NORMODYNE) 100 MG tablet,  triamterene-hydrochlorothiazide (MAXZIDE) 75-50 MG tablet  Advise to follow up in one week via my chart with at home blood pressure readings to monitor trends Labs ordered. Discussed with  patient to monitor their blood pressure regularly and maintain a heart-healthy diet rich in fruits, vegetables, whole grains, and low-fat dairy, while reducing sodium intake to less than 2,300 mg per day. Regular physical activity, such as 30 minutes of moderate exercise most days of the week, will help lower blood pressure and improve overall cardiovascular health. Avoiding smoking, limiting alcohol consumption, and managing stress. Take  prescribed medication, & take it as directed and avoid skipping doses. Seek emergency care if your blood pressure is (over 180/100) or you experience chest pain, shortness of breath, or sudden vision changes.Patient verbalizes understanding regarding plan of care and all questions answered.

## 2023-11-10 NOTE — Assessment & Plan Note (Signed)

## 2023-11-10 NOTE — Progress Notes (Signed)
Complete physical exam  Patient: Kathleen Kirby   DOB: 07-04-1961   62 y.o. Female  MRN: [id number]  Subjective:    Chief Complaint  Patient presents with   Follow-up    CPE    Kathleen Kirby is a 62 y.o. female who presents today for a complete physical exam. She reports consuming a general diet. The patient does not participate in regular exercise at present. She generally feels well. She reports sleeping fair about 6 hours per night. Patient works 12 hours 5 days per week. She does have additional problems to discuss today.    Most recent fall risk assessment:    11/10/2023    4:02 PM  Fall Risk   Falls in the past year? 0  Number falls in past yr: 0  Injury with Fall? 0  Risk for fall due to : No Fall Risks  Follow up Falls evaluation completed     Most recent depression screenings:    11/10/2023    4:02 PM 06/05/2023    4:15 PM  PHQ 2/9 Scores  PHQ - 2 Score 0 0    Vision:Within last year and Dental: No current dental problems and Receives regular dental care  Patient Care Team: Kerri Perches, MD as PCP - General Rourk, Gerrit Friends, MD (Gastroenterology)   Outpatient Medications Prior to Visit  Medication Sig   amLODipine-olmesartan (AZOR) 5-20 MG tablet TAKE 1 TABLET BY MOUTH EVERY DAY   aspirin EC 81 MG tablet Take 81 mg by mouth daily. Swallow whole.   cyclobenzaprine (FLEXERIL) 10 MG tablet Take one tablet at bedtime as needed, for back and/ or leg spasm   furosemide (LASIX) 20 MG tablet TAKE 1 TABLET BY MOUTH  DAILY AS NEEDED FOR FLUID  OR EDEMA   labetalol (NORMODYNE) 100 MG tablet Take 1 tablet (100 mg total) by mouth 2 (two) times daily.   Multiple Minerals-Vitamins (CALCIUM & VIT D3 BONE HEALTH PO) Take by mouth.   Potassium 99 MG TABS Take by mouth.   triamterene-hydrochlorothiazide (MAXZIDE) 75-50 MG tablet Take 1 tablet by mouth daily.   UNABLE TO FIND Med Name: Pair of compression stockings 20-30 mmHg Wear stockings when working or standing  for extended periods of time.   [DISCONTINUED] rosuvastatin (CRESTOR) 10 MG tablet Take 1 tablet (10 mg total) by mouth daily.   No facility-administered medications prior to visit.   Review of Systems  Constitutional:  Negative for chills and fever.  Respiratory:  Negative for shortness of breath.   Cardiovascular:  Negative for chest pain.  Neurological:  Negative for dizziness and headaches.       Objective:    BP (!) 144/80   Pulse 83   Ht 5' 1.5" (1.562 m)   LMP 09/29/2012   SpO2 95%   BMI 29.56 kg/m  BP Readings from Last 3 Encounters:  11/10/23 (!) 144/80  06/05/23 115/63  11/05/22 115/70      Physical Exam Vitals reviewed.  Constitutional:      General: She is not in acute distress.    Appearance: Normal appearance. She is not ill-appearing, toxic-appearing or diaphoretic.  HENT:     Head: Normocephalic.  Eyes:     General:        Right eye: No discharge.        Left eye: No discharge.     Conjunctiva/sclera: Conjunctivae normal.  Cardiovascular:     Rate and Rhythm: Normal rate.     Pulses: Normal  pulses.     Heart sounds: Normal heart sounds.  Pulmonary:     Effort: Pulmonary effort is normal. No respiratory distress.     Breath sounds: Normal breath sounds.  Musculoskeletal:        General: Normal range of motion.  Skin:    General: Skin is warm and dry.     Capillary Refill: Capillary refill takes less than 2 seconds.  Neurological:     Mental Status: She is alert.  Psychiatric:        Mood and Affect: Mood normal.      No results found for any visits on 11/10/23.    Assessment & Plan:    Routine Health Maintenance and Physical Exam  Immunization History  Administered Date(s) Administered   Influenza,inj,Quad PF,6+ Mos 12/01/2013, 09/14/2014, 10/10/2015, 10/09/2016, 10/15/2017, 11/18/2018, 10/27/2019, 10/25/2020, 10/30/2021, 11/05/2022   Moderna SARS-COV2 Booster Vaccination 06/19/2021   Moderna Sars-Covid-2 Vaccination 04/12/2020,  05/10/2020, 11/16/2020   PNEUMOCOCCAL CONJUGATE-20 10/30/2021   Pneumococcal Polysaccharide-23 10/09/2016   Td 01/09/2006   Tdap 05/14/2021   Zoster Recombinant(Shingrix) 03/15/2020, 09/06/2020    Health Maintenance  Topic Date Due   INFLUENZA VACCINE  07/16/2023   Diabetic kidney evaluation - Urine ACR  11/06/2023   COVID-19 Vaccine (5 - 2023-24 season) 11/26/2023 (Originally 08/16/2023)   HEMOGLOBIN A1C  11/28/2023   Diabetic kidney evaluation - eGFR measurement  05/28/2024   OPHTHALMOLOGY EXAM  11/05/2024   FOOT EXAM  11/09/2024   MAMMOGRAM  12/05/2024   Fecal DNA (Cologuard)  03/02/2025   Cervical Cancer Screening (HPV/Pap Cotest)  10/30/2026   DTaP/Tdap/Td (3 - Td or Tdap) 05/15/2031   Hepatitis C Screening  Completed   HIV Screening  Completed   Zoster Vaccines- Shingrix  Completed   HPV VACCINES  Aged Out    Discussed health benefits of physical activity, and encouraged her to engage in regular exercise appropriate for her age and condition.  Primary hypertension -     BMP8+eGFR -     Lipid panel -     CBC with Differential/Platelet  Type 2 diabetes mellitus without complication, without long-term current use of insulin (HCC) -     Hemoglobin A1c  Annual visit for general adult medical examination without abnormal findings Assessment & Plan: A comprehensive physical examination was completed, and necessary labs were ordered. Screening and health maintenance recommendations have been updated. The patient received counseling on exercise and nutrition. BMI was assessed and discussed Advise for heart health, focus on: Eat more fruits and vegetables: Aim for a variety of colors. Choose whole grains: Brown rice, oats, and whole-wheat bread. Limit unhealthy fats: Avoid trans fats; use olive or avocado oil instead. Include lean proteins: Opt for fish, chicken, beans, and legumes. Reduce sodium: Limit processed foods and add less salt. Stay hydrated: Drink plenty of  water. Exercise regularly: Aim for at least 30 minutes of moderate exercise, like walking or cycling, 5 days a week.     Type 2 diabetes mellitus with vascular disease (HCC) Assessment & Plan: Last Hemoglobin A1c: 6.6 Labs: Ordered today, results pending; will follow up accordingly. The patient reports not adhering to prescribed medications and focusing on lifestyle changes only   Reviewed non-pharmacological interventions, including a balanced diet rich in lean proteins, healthy fats, whole grains, and high-fiber vegetables. Emphasized reducing refined sugars and processed carbohydrates, and incorporating more fruits, leafy greens, and legumes. Education: Patient was educated on recognizing signs and symptoms of both hypoglycemia and hyperglycemia, and advised to seek emergency  care if these symptoms occur. Follow-Up: Scheduled for follow-up in 3-4 months, or sooner if needed. Patient Understanding: The patient verbalized understanding of the care plan, and all questions were answered. Additional Care: Ophthalmology referral was placed. Foot exam results were within normal limits.    Essential hypertension Assessment & Plan: Vitals:   11/10/23 1538 11/10/23 1559  BP: (!) 155/68 (!) 144/80  Blood pressure not controlled in today's visit Current Regimen: amLODipine-olmesartan (AZOR) 5-20 MG tablet,  labetalol (NORMODYNE) 100 MG tablet,  triamterene-hydrochlorothiazide (MAXZIDE) 75-50 MG tablet  Advise to follow up in one week via my chart with at home blood pressure readings to monitor trends Labs ordered. Discussed with  patient to monitor their blood pressure regularly and maintain a heart-healthy diet rich in fruits, vegetables, whole grains, and low-fat dairy, while reducing sodium intake to less than 2,300 mg per day. Regular physical activity, such as 30 minutes of moderate exercise most days of the week, will help lower blood pressure and improve overall cardiovascular health.  Avoiding smoking, limiting alcohol consumption, and managing stress. Take  prescribed medication, & take it as directed and avoid skipping doses. Seek emergency care if your blood pressure is (over 180/100) or you experience chest pain, shortness of breath, or sudden vision changes.Patient verbalizes understanding regarding plan of care and all questions answered.    TSH (thyroid-stimulating hormone deficiency) -     TSH + free T4  Muscle cramps -     Magnesium  Vitamin D deficiency -     VITAMIN D 25 Hydroxy (Vit-D Deficiency, Fractures)  Iron deficiency anemia, unspecified iron deficiency anemia type -     Iron, TIBC and Ferritin Panel -     Vitamin B12  Other orders -     Rosuvastatin Calcium; Take 1 tablet (10 mg total) by mouth daily.  Dispense: 90 tablet; Refill: 1    Return in 6 months (on 05/09/2024), or if symptoms worsen or fail to improve, for hypertension, type 2 diabetes.     Cruzita Lederer Newman Nip, FNP

## 2023-11-10 NOTE — Assessment & Plan Note (Addendum)
Last Hemoglobin A1c: 6.6 Labs: Ordered today, results pending; will follow up accordingly. The patient reports not adhering to prescribed medications and focusing on lifestyle changes only   Reviewed non-pharmacological interventions, including a balanced diet rich in lean proteins, healthy fats, whole grains, and high-fiber vegetables. Emphasized reducing refined sugars and processed carbohydrates, and incorporating more fruits, leafy greens, and legumes. Education: Patient was educated on recognizing signs and symptoms of both hypoglycemia and hyperglycemia, and advised to seek emergency care if these symptoms occur. Follow-Up: Scheduled for follow-up in 3-4 months, or sooner if needed. Patient Understanding: The patient verbalized understanding of the care plan, and all questions were answered. Additional Care: Ophthalmology referral was placed. Foot exam results were within normal limits.

## 2023-11-13 ENCOUNTER — Encounter: Payer: Commercial Managed Care - PPO | Admitting: Family Medicine

## 2023-11-21 ENCOUNTER — Other Ambulatory Visit: Payer: Self-pay | Admitting: Family Medicine

## 2023-12-10 ENCOUNTER — Ambulatory Visit (HOSPITAL_COMMUNITY)
Admission: RE | Admit: 2023-12-10 | Discharge: 2023-12-10 | Disposition: A | Discharge status: Home / Self Care | Payer: Commercial Managed Care - PPO | Source: Ambulatory Visit | Attending: Family Medicine | Admitting: Family Medicine

## 2023-12-10 DIAGNOSIS — Z1231 Encounter for screening mammogram for malignant neoplasm of breast: Secondary | ICD-10-CM | POA: Diagnosis present

## 2023-12-15 ENCOUNTER — Other Ambulatory Visit (HOSPITAL_COMMUNITY): Payer: Self-pay | Admitting: Family Medicine

## 2023-12-15 DIAGNOSIS — R928 Other abnormal and inconclusive findings on diagnostic imaging of breast: Secondary | ICD-10-CM

## 2023-12-18 ENCOUNTER — Telehealth: Payer: Self-pay | Admitting: Family Medicine

## 2023-12-18 NOTE — Telephone Encounter (Signed)
 Copied from CRM [id number]. Topic: General - Call Back - No Documentation >> Dec 18, 2023  2:57 PM Deleta HERO wrote: Reason for CRM: The patient wanted to follow on missed call she received today, informed patient theres no documentation shown for her missed call at this time. The patient wanted to know if this is for her blood pressure paperwork that she submitted to the office?  Callback:  [phone]

## 2023-12-22 ENCOUNTER — Other Ambulatory Visit: Payer: Self-pay | Admitting: Family Medicine

## 2023-12-22 NOTE — Progress Notes (Signed)
 Received At home blood pressure readings  131/80 131/70 1155/72 132/76 106/65 110/64 124/65 124/75

## 2023-12-24 ENCOUNTER — Ambulatory Visit (HOSPITAL_COMMUNITY)
Admission: RE | Admit: 2023-12-24 | Discharge: 2023-12-24 | Disposition: A | Discharge status: Home / Self Care | Payer: Commercial Managed Care - PPO | Source: Ambulatory Visit | Attending: Family Medicine | Admitting: Family Medicine

## 2023-12-24 ENCOUNTER — Encounter (HOSPITAL_COMMUNITY): Payer: Self-pay

## 2023-12-24 DIAGNOSIS — R928 Other abnormal and inconclusive findings on diagnostic imaging of breast: Secondary | ICD-10-CM

## 2024-03-06 ENCOUNTER — Other Ambulatory Visit: Payer: Self-pay | Admitting: Family Medicine

## 2024-03-06 DIAGNOSIS — I10 Essential (primary) hypertension: Secondary | ICD-10-CM

## 2024-05-12 ENCOUNTER — Ambulatory Visit (INDEPENDENT_AMBULATORY_CARE_PROVIDER_SITE_OTHER): Payer: Commercial Managed Care - PPO | Admitting: Family Medicine

## 2024-05-12 ENCOUNTER — Encounter: Payer: Self-pay | Admitting: Family Medicine

## 2024-05-12 VITALS — BP 120/80 | HR 75 | Resp 16 | Ht 61.5 in | Wt 157.0 lb

## 2024-05-12 DIAGNOSIS — I10 Essential (primary) hypertension: Secondary | ICD-10-CM

## 2024-05-12 DIAGNOSIS — E785 Hyperlipidemia, unspecified: Secondary | ICD-10-CM

## 2024-05-12 DIAGNOSIS — E1159 Type 2 diabetes mellitus with other circulatory complications: Secondary | ICD-10-CM

## 2024-05-12 DIAGNOSIS — E66811 Obesity, class 1: Secondary | ICD-10-CM

## 2024-05-12 DIAGNOSIS — E559 Vitamin D deficiency, unspecified: Secondary | ICD-10-CM | POA: Diagnosis not present

## 2024-05-12 MED ORDER — AMLODIPINE-OLMESARTAN 5-20 MG PO TABS: 1.0000 | ORAL_TABLET | Freq: Every day | ORAL | 3 refills | Status: AC

## 2024-05-12 MED ORDER — LABETALOL HCL 100 MG PO TABS: 100.0000 mg | ORAL_TABLET | Freq: Two times a day (BID) | ORAL | 3 refills | Status: AC

## 2024-05-12 MED ORDER — TRIAMTERENE-HCTZ 75-50 MG PO TABS: 1.0000 | ORAL_TABLET | Freq: Every day | ORAL | 3 refills | Status: AC

## 2024-05-12 NOTE — Progress Notes (Signed)
 Kathleen Kirby     MRN: [id number]      DOB: 1961-06-18  Chief Complaint  Patient presents with   Hypertension    6 month follow up     HPI Kathleen Kirby is here for follow up and re-evaluation of chronic medical conditions, medication management and review of any available recent lab and radiology data.  Preventive health is updated, specifically  Cancer screening and Immunization.   Questions or concerns regarding consultations or procedures which the PT has had in the interim are  addressed. The PT denies any adverse reactions to current medications since the last visit.  There are no new concerns.  There are no specific complaints   ROS Denies recent fever or chills. Denies sinus pressure, nasal congestion, ear pain or sore throat. Denies chest congestion, productive cough or wheezing. Denies chest pains, palpitations and leg swelling Denies abdominal pain, nausea, vomiting,diarrhea or constipation.   Denies dysuria, frequency, hesitancy or incontinence. Denies joint pain, swelling and limitation in mobility. Denies headaches, seizures, numbness, or tingling. Denies depression, anxiety or insomnia. Denies skin break down or rash.   PE  BP 120/80   Pulse 75   Resp 16   Ht 5' 1.5" (1.562 m)   Wt 157 lb 0.6 oz (71.2 kg)   LMP 09/29/2012   SpO2 98%   BMI 29.19 kg/m   Patient alert and oriented and in no cardiopulmonary distress.  HEENT: No facial asymmetry, EOMI,     Neck supple .  Chest: Clear to auscultation bilaterally.  CVS: S1, S2 no murmurs, no S3.Regular rate.  ABD: Soft non tender.   Ext: No edema  MS: Adequate ROM spine, shoulders, hips and knees.  Skin: Intact, no ulcerations or rash noted.  Psych: Good eye contact, normal affect. Memory intact not anxious or depressed appearing.  CNS: CN 2-12 intact, power,  normal throughout.no focal deficits noted.   Assessment & Plan  Essential hypertension Controlled, no change in medication DASH  diet and commitment to daily physical activity for a minimum of 30 minutes discussed and encouraged, as a part of hypertension management. The importance of attaining a healthy weight is also discussed.     05/12/2024    3:59 PM 05/12/2024    3:22 PM 11/10/2023    3:59 PM 11/10/2023    3:38 PM 09/17/2023    4:34 PM 06/05/2023    4:14 PM 03/12/2023    3:59 PM  BP/Weight  Systolic BP 120 135 144 155  115   Diastolic BP 80 79 80 68  63   Wt. (Lbs)  157.04   159 159 159  BMI  29.19 kg/m2   29.56 kg/m2 30.04 kg/m2 26.06 kg/m2       Type 2 diabetes mellitus with vascular disease (HCC) Diabetes associated with hypertension and hyperlipidemia  Kathleen Kirby is reminded of the importance of commitment to daily physical activity for 30 minutes or more, as able and the need to limit carbohydrate intake to 30 to 60 grams per meal to help with blood sugar control.   Updated lab  today  Kathleen Kirby is reminded of the importance of daily foot exam, annual eye examination, and good blood sugar, blood pressure and cholesterol control.     Latest Ref Rng & Units 05/29/2023    9:14 AM 11/05/2022    4:44 PM 07/04/2022    4:02 PM 02/27/2022    4:51 PM 10/30/2021    4:27 PM  Diabetic Labs  HbA1c 4.8 - 5.6 % 6.6  6.6  6.5  7.0  7.0   Micro/Creat Ratio 0 - 29 mg/g creat  9      Chol 100 - 199 mg/dL 161  096  045   409   HDL >39 mg/dL 83  77  85   84   Calc LDL 0 - 99 mg/dL 93  811  914   782   Triglycerides 0 - 149 mg/dL 41  77  53   46   Creatinine 0.57 - 1.00 mg/dL 9.56  2.13  0.86   5.78       05/12/2024    3:59 PM 05/12/2024    3:22 PM 11/10/2023    3:59 PM 11/10/2023    3:38 PM 09/17/2023    4:34 PM 06/05/2023    4:14 PM 03/12/2023    3:59 PM  BP/Weight  Systolic BP 120 135 144 155  115   Diastolic BP 80 79 80 68  63   Wt. (Lbs)  157.04   159 159 159  BMI  29.19 kg/m2   29.56 kg/m2 30.04 kg/m2 26.06 kg/m2      11/10/2023    3:20 PM 11/05/2022    4:00 PM  Foot/eye exam completion dates   Foot Form Completion Done Done        Obesity (BMI 30.0-34.9)  Patient re-educated about  the importance of commitment to a  minimum of 150 minutes of exercise per week as able.  The importance of healthy food choices with portion control discussed, as well as eating regularly and within a 12 hour window most days. The need to choose "clean , green" food 50 to 75% of the time is discussed, as well as to make water the primary drink and set a goal of 64 ounces water daily.       05/12/2024    3:22 PM 11/10/2023    3:38 PM 09/17/2023    4:34 PM  Weight /BMI  Weight 157 lb 0.6 oz  159 lb  Height 5' 1.5" (1.562 m) 5' 1.5" (1.562 m) 5' 1.5" (1.562 m)  BMI 29.19 kg/m2 29.56 kg/m2 29.56 kg/m2      Hyperlipidemia LDL goal <100 Hyperlipidemia:Low fat diet discussed and encouraged.   Lipid Panel  Lab Results  Component Value Date   CHOL 184 05/29/2023   HDL 83 05/29/2023   LDLCALC 93 05/29/2023   TRIG 41 05/29/2023   CHOLHDL 2.2 05/29/2023     Updated lab needed at/ before next visit.   Vitamin D  deficiency Updated lab needed at/ before next visit.

## 2024-05-12 NOTE — Assessment & Plan Note (Addendum)
 Diabetes associated with hypertension and hyperlipidemia  Kathleen Kirby is reminded of the importance of commitment to daily physical activity for 30 minutes or more, as able and the need to limit carbohydrate intake to 30 to 60 grams per meal to help with blood sugar control.   Updated lab  today  Kathleen Kirby is reminded of the importance of daily foot exam, annual eye examination, and good blood sugar, blood pressure and cholesterol control.     Latest Ref Rng & Units 05/29/2023    9:14 AM 11/05/2022    4:44 PM 07/04/2022    4:02 PM 02/27/2022    4:51 PM 10/30/2021    4:27 PM  Diabetic Labs  HbA1c 4.8 - 5.6 % 6.6  6.6  6.5  7.0  7.0   Micro/Creat Ratio 0 - 29 mg/g creat  9      Chol 100 - 199 mg/dL 409  811  914   782   HDL >39 mg/dL 83  77  85   84   Calc LDL 0 - 99 mg/dL 93  956  213   086   Triglycerides 0 - 149 mg/dL 41  77  53   46   Creatinine 0.57 - 1.00 mg/dL 5.78  4.69  6.29   5.28       05/12/2024    3:59 PM 05/12/2024    3:22 PM 11/10/2023    3:59 PM 11/10/2023    3:38 PM 09/17/2023    4:34 PM 06/05/2023    4:14 PM 03/12/2023    3:59 PM  BP/Weight  Systolic BP 120 135 144 155  115   Diastolic BP 80 79 80 68  63   Wt. (Lbs)  157.04   159 159 159  BMI  29.19 kg/m2   29.56 kg/m2 30.04 kg/m2 26.06 kg/m2      11/10/2023    3:20 PM 11/05/2022    4:00 PM  Foot/eye exam completion dates  Foot Form Completion Done Done

## 2024-05-12 NOTE — Assessment & Plan Note (Signed)
 Controlled, no change in medication DASH diet and commitment to daily physical activity for a minimum of 30 minutes discussed and encouraged, as a part of hypertension management. The importance of attaining a healthy weight is also discussed.     05/12/2024    3:59 PM 05/12/2024    3:22 PM 11/10/2023    3:59 PM 11/10/2023    3:38 PM 09/17/2023    4:34 PM 06/05/2023    4:14 PM 03/12/2023    3:59 PM  BP/Weight  Systolic BP 120 135 144 155  115   Diastolic BP 80 79 80 68  63   Wt. (Lbs)  157.04   159 159 159  BMI  29.19 kg/m2   29.56 kg/m2 30.04 kg/m2 26.06 kg/m2

## 2024-05-12 NOTE — Assessment & Plan Note (Signed)
  Patient re-educated about  the importance of commitment to a  minimum of 150 minutes of exercise per week as able.  The importance of healthy food choices with portion control discussed, as well as eating regularly and within a 12 hour window most days. The need to choose "clean , green" food 50 to 75% of the time is discussed, as well as to make water the primary drink and set a goal of 64 ounces water daily.       05/12/2024    3:22 PM 11/10/2023    3:38 PM 09/17/2023    4:34 PM  Weight /BMI  Weight 157 lb 0.6 oz  159 lb  Height 5' 1.5" (1.562 m) 5' 1.5" (1.562 m) 5' 1.5" (1.562 m)  BMI 29.19 kg/m2 29.56 kg/m2 29.56 kg/m2

## 2024-05-12 NOTE — Assessment & Plan Note (Signed)
 Hyperlipidemia:Low fat diet discussed and encouraged.   Lipid Panel  Lab Results  Component Value Date   CHOL 184 05/29/2023   HDL 83 05/29/2023   LDLCALC 93 05/29/2023   TRIG 41 05/29/2023   CHOLHDL 2.2 05/29/2023     Updated lab needed at/ before next visit.

## 2024-05-12 NOTE — Patient Instructions (Signed)
 Annual exam first week in December, call if you need me sooner.  Urine ACR lipid CMP and EGFR HbA1c CBC TSH and vitamin D  today.  I recommend coronary calcium  scoring based on your family history of premature heart disease and your comorbidities you can look further into this and let me know if you decide on having this done.  Blood pressure is excellent.  Continue to be careful with food choices and commit to regular physical activity for good health.  Best for the Summer and Fall!  Thanks for choosing [facility], we consider it a privelige to serve you.

## 2024-05-12 NOTE — Assessment & Plan Note (Signed)
 Updated lab needed at/ before next visit.

## 2024-05-13 ENCOUNTER — Ambulatory Visit: Payer: Self-pay | Admitting: Family Medicine

## 2024-05-14 LAB — CMP14+EGFR
ALT: 17 IU/L (ref 0–32)
AST: 25 IU/L (ref 0–40)
Albumin: 4.5 g/dL (ref 3.9–4.9)
Alkaline Phosphatase: 105 IU/L (ref 44–121)
BUN/Creatinine Ratio: 18 (ref 12–28)
BUN: 13 mg/dL (ref 8–27)
Bilirubin Total: 0.3 mg/dL (ref 0.0–1.2)
CO2: 24 mmol/L (ref 20–29)
Calcium: 10 mg/dL (ref 8.7–10.3)
Chloride: 101 mmol/L (ref 96–106)
Creatinine, Ser: 0.71 mg/dL (ref 0.57–1.00)
Globulin, Total: 2.3 g/dL (ref 1.5–4.5)
Glucose: 67 mg/dL — ABNORMAL LOW (ref 70–99)
Potassium: 3.9 mmol/L (ref 3.5–5.2)
Sodium: 139 mmol/L (ref 134–144)
Total Protein: 6.8 g/dL (ref 6.0–8.5)
eGFR: 96 mL/min/{1.73_m2} (ref >59)

## 2024-05-14 LAB — CBC WITH DIFFERENTIAL/PLATELET
Basophils Absolute: 0 10*3/uL (ref 0.0–0.2)
Basos: 1 %
EOS (ABSOLUTE): 0.1 10*3/uL (ref 0.0–0.4)
Eos: 1 %
Hematocrit: 36 % (ref 34.0–46.6)
Hemoglobin: 11.2 g/dL (ref 11.1–15.9)
Immature Grans (Abs): 0 10*3/uL (ref 0.0–0.1)
Immature Granulocytes: 1 %
Lymphocytes Absolute: 2.4 10*3/uL (ref 0.7–3.1)
Lymphs: 37 %
MCH: 25.9 pg — ABNORMAL LOW (ref 26.6–33.0)
MCHC: 31.1 g/dL — ABNORMAL LOW (ref 31.5–35.7)
MCV: 83 fL (ref 79–97)
Monocytes Absolute: 0.5 10*3/uL (ref 0.1–0.9)
Monocytes: 8 %
Neutrophils Absolute: 3.3 10*3/uL (ref 1.4–7.0)
Neutrophils: 52 %
Platelets: 292 10*3/uL (ref 150–450)
RBC: 4.32 x10E6/uL (ref 3.77–5.28)
RDW: 13.1 % (ref 11.7–15.4)
WBC: 6.4 10*3/uL (ref 3.4–10.8)

## 2024-05-14 LAB — HEMOGLOBIN A1C
Est. average glucose Bld gHb Est-mCnc: 137 mg/dL
Hgb A1c MFr Bld: 6.4 % — ABNORMAL HIGH (ref 4.8–5.6)

## 2024-05-14 LAB — TSH: TSH: 1.21 u[IU]/mL (ref 0.450–4.500)

## 2024-05-14 LAB — MICROALBUMIN / CREATININE URINE RATIO
Creatinine, Urine: 20 mg/dL
Microalb/Creat Ratio: <15 mg/g{creat} (ref 0–29)
Microalbumin, Urine: <3 ug/mL

## 2024-05-14 LAB — LIPID PANEL
Chol/HDL Ratio: 2 ratio (ref 0.0–4.4)
Cholesterol, Total: 184 mg/dL (ref 100–199)
HDL: 91 mg/dL (ref >39)
LDL Chol Calc (NIH): 86 mg/dL (ref 0–99)
Triglycerides: 28 mg/dL (ref 0–149)
VLDL Cholesterol Cal: 7 mg/dL (ref 5–40)

## 2024-05-14 LAB — VITAMIN D 25 HYDROXY (VIT D DEFICIENCY, FRACTURES): Vit D, 25-Hydroxy: 55.2 ng/mL (ref 30.0–100.0)

## 2024-05-16 ENCOUNTER — Telehealth: Payer: Self-pay

## 2024-05-16 NOTE — Telephone Encounter (Signed)
 Copied from CRM [id number]. Topic: Clinical - Lab/Test Results >> May 16, 2024 11:59 AM [name] wrote: Reason for CRM: Patient returned missed call . Only call showing is for labs. Someone has already given labs. Thank You.

## 2024-09-08 ENCOUNTER — Ambulatory Visit

## 2024-09-09 ENCOUNTER — Ambulatory Visit (INDEPENDENT_AMBULATORY_CARE_PROVIDER_SITE_OTHER)

## 2024-09-09 DIAGNOSIS — Z23 Encounter for immunization: Secondary | ICD-10-CM

## 2024-09-09 NOTE — Progress Notes (Signed)
 Patient is in office today for a nurse visit for Immunization. Patient Injection was given in the  Left deltoid. Patient tolerated injection well.

## 2024-11-18 ENCOUNTER — Encounter: Admitting: Family Medicine

## 2024-11-25 ENCOUNTER — Other Ambulatory Visit (HOSPITAL_COMMUNITY): Payer: Self-pay | Admitting: Family Medicine

## 2024-11-25 DIAGNOSIS — Z1231 Encounter for screening mammogram for malignant neoplasm of breast: Secondary | ICD-10-CM

## 2024-12-30 ENCOUNTER — Ambulatory Visit (HOSPITAL_COMMUNITY)
Admission: RE | Admit: 2024-12-30 | Discharge: 2024-12-30 | Disposition: A | Discharge status: Home / Self Care | Source: Ambulatory Visit | Attending: Family Medicine | Admitting: Family Medicine

## 2024-12-30 DIAGNOSIS — Z1231 Encounter for screening mammogram for malignant neoplasm of breast: Secondary | ICD-10-CM | POA: Insufficient documentation

## 2025-02-03 ENCOUNTER — Encounter: Admitting: Family Medicine
